# Patient Record
Sex: Female | Born: 1945
Health system: Southern US, Community
[De-identification: ages and names within clinical notes are randomized; demographics above are authoritative.]

## PROBLEM LIST (undated history)

## (undated) DIAGNOSIS — F419 Anxiety disorder, unspecified: Secondary | ICD-10-CM

## (undated) DIAGNOSIS — K7689 Other specified diseases of liver: Secondary | ICD-10-CM

## (undated) DIAGNOSIS — Z973 Presence of spectacles and contact lenses: Secondary | ICD-10-CM

## (undated) DIAGNOSIS — K642 Third degree hemorrhoids: Secondary | ICD-10-CM

## (undated) DIAGNOSIS — F32A Depression, unspecified: Secondary | ICD-10-CM

## (undated) DIAGNOSIS — Z8619 Personal history of other infectious and parasitic diseases: Secondary | ICD-10-CM

## (undated) DIAGNOSIS — K5909 Other constipation: Secondary | ICD-10-CM

## (undated) DIAGNOSIS — F329 Major depressive disorder, single episode, unspecified: Secondary | ICD-10-CM

## (undated) DIAGNOSIS — Z860100 Personal history of colon polyps, unspecified: Secondary | ICD-10-CM

## (undated) DIAGNOSIS — N281 Cyst of kidney, acquired: Secondary | ICD-10-CM

## (undated) DIAGNOSIS — J439 Emphysema, unspecified: Secondary | ICD-10-CM

## (undated) DIAGNOSIS — E785 Hyperlipidemia, unspecified: Secondary | ICD-10-CM

## (undated) DIAGNOSIS — R112 Nausea with vomiting, unspecified: Secondary | ICD-10-CM

## (undated) DIAGNOSIS — M1990 Unspecified osteoarthritis, unspecified site: Secondary | ICD-10-CM

## (undated) DIAGNOSIS — J309 Allergic rhinitis, unspecified: Secondary | ICD-10-CM

## (undated) DIAGNOSIS — K219 Gastro-esophageal reflux disease without esophagitis: Secondary | ICD-10-CM

## (undated) DIAGNOSIS — Z9889 Other specified postprocedural states: Secondary | ICD-10-CM

## (undated) DIAGNOSIS — K644 Residual hemorrhoidal skin tags: Secondary | ICD-10-CM

## (undated) DIAGNOSIS — K5730 Diverticulosis of large intestine without perforation or abscess without bleeding: Secondary | ICD-10-CM

## (undated) DIAGNOSIS — M5481 Occipital neuralgia: Secondary | ICD-10-CM

## (undated) DIAGNOSIS — Z972 Presence of dental prosthetic device (complete) (partial): Secondary | ICD-10-CM

## (undated) DIAGNOSIS — Z8673 Personal history of transient ischemic attack (TIA), and cerebral infarction without residual deficits: Secondary | ICD-10-CM

## (undated) DIAGNOSIS — K862 Cyst of pancreas: Secondary | ICD-10-CM

## (undated) HISTORY — PX: COLONOSCOPY WITH PROPOFOL: SHX5780

## (undated) HISTORY — DX: Major depressive disorder, single episode, unspecified: F32.9

## (undated) HISTORY — PX: OTHER SURGICAL HISTORY: SHX169

## (undated) HISTORY — DX: Residual hemorrhoidal skin tags: K64.4

## (undated) HISTORY — DX: Anxiety disorder, unspecified: F41.9

## (undated) HISTORY — DX: Unspecified osteoarthritis, unspecified site: M19.90

## (undated) HISTORY — PX: ESOPHAGOGASTRODUODENOSCOPY: SHX1529

## (undated) HISTORY — PX: BREAST EXCISIONAL BIOPSY: SUR124

## (undated) HISTORY — DX: Occipital neuralgia: M54.81

## (undated) HISTORY — DX: Hyperlipidemia, unspecified: E78.5

## (undated) HISTORY — DX: Gastro-esophageal reflux disease without esophagitis: K21.9

## (undated) HISTORY — PX: CATARACT EXTRACTION: SUR2

## (undated) HISTORY — DX: Depression, unspecified: F32.A

---
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
## 1993-02-03 HISTORY — PX: OTHER SURGICAL HISTORY: SHX169

## 1998-07-20 ENCOUNTER — Other Ambulatory Visit: Admission: RE | Admit: 1998-07-20 | Discharge: 1998-07-20 | Payer: Self-pay | Admitting: Obstetrics and Gynecology

## 1999-03-01 ENCOUNTER — Encounter: Admission: RE | Admit: 1999-03-01 | Discharge: 1999-03-01 | Payer: Self-pay | Admitting: Surgery

## 1999-03-05 ENCOUNTER — Encounter: Payer: Self-pay | Admitting: Surgery

## 1999-03-05 ENCOUNTER — Encounter: Admission: RE | Admit: 1999-03-05 | Discharge: 1999-03-05 | Payer: Self-pay | Admitting: Surgery

## 1999-07-11 ENCOUNTER — Encounter: Payer: Self-pay | Admitting: Family Medicine

## 1999-07-11 ENCOUNTER — Encounter: Payer: Self-pay | Admitting: Surgery

## 1999-07-11 ENCOUNTER — Encounter: Admission: RE | Admit: 1999-07-11 | Discharge: 1999-07-11 | Payer: Self-pay | Admitting: Surgery

## 1999-08-19 ENCOUNTER — Encounter: Payer: Self-pay | Admitting: Gastroenterology

## 1999-08-19 ENCOUNTER — Ambulatory Visit (HOSPITAL_COMMUNITY): Admission: RE | Admit: 1999-08-19 | Discharge: 1999-08-19 | Payer: Self-pay | Admitting: Family Medicine

## 1999-09-30 ENCOUNTER — Encounter: Payer: Self-pay | Admitting: Obstetrics and Gynecology

## 1999-09-30 ENCOUNTER — Ambulatory Visit (HOSPITAL_COMMUNITY): Admission: RE | Admit: 1999-09-30 | Discharge: 1999-09-30 | Payer: Self-pay | Admitting: Obstetrics and Gynecology

## 2000-06-12 ENCOUNTER — Other Ambulatory Visit: Admission: RE | Admit: 2000-06-12 | Discharge: 2000-06-12 | Payer: Self-pay | Admitting: Family Medicine

## 2000-06-12 ENCOUNTER — Encounter: Payer: Self-pay | Admitting: Surgery

## 2000-06-12 ENCOUNTER — Encounter: Admission: RE | Admit: 2000-06-12 | Discharge: 2000-06-12 | Payer: Self-pay | Admitting: Surgery

## 2001-03-15 ENCOUNTER — Encounter: Admission: RE | Admit: 2001-03-15 | Discharge: 2001-03-15 | Payer: Self-pay | Admitting: Surgery

## 2001-03-15 ENCOUNTER — Encounter: Payer: Self-pay | Admitting: Surgery

## 2001-07-02 ENCOUNTER — Encounter: Admission: RE | Admit: 2001-07-02 | Discharge: 2001-07-02 | Payer: Self-pay | Admitting: Family Medicine

## 2001-07-02 ENCOUNTER — Encounter: Payer: Self-pay | Admitting: Family Medicine

## 2001-07-02 ENCOUNTER — Other Ambulatory Visit: Admission: RE | Admit: 2001-07-02 | Discharge: 2001-07-02 | Payer: Self-pay | Admitting: Family Medicine

## 2001-08-05 ENCOUNTER — Ambulatory Visit (HOSPITAL_COMMUNITY): Admission: RE | Admit: 2001-08-05 | Discharge: 2001-08-05 | Payer: Self-pay | Admitting: Gastroenterology

## 2002-07-22 ENCOUNTER — Encounter: Payer: Self-pay | Admitting: Surgery

## 2002-07-22 ENCOUNTER — Encounter: Admission: RE | Admit: 2002-07-22 | Discharge: 2002-07-22 | Payer: Self-pay | Admitting: Surgery

## 2002-07-22 ENCOUNTER — Other Ambulatory Visit: Admission: RE | Admit: 2002-07-22 | Discharge: 2002-07-22 | Payer: Self-pay | Admitting: Obstetrics and Gynecology

## 2003-08-01 ENCOUNTER — Encounter: Admission: RE | Admit: 2003-08-01 | Discharge: 2003-08-01 | Payer: Self-pay | Admitting: Surgery

## 2003-08-01 ENCOUNTER — Other Ambulatory Visit: Admission: RE | Admit: 2003-08-01 | Discharge: 2003-08-01 | Payer: Self-pay | Admitting: Obstetrics and Gynecology

## 2004-08-02 ENCOUNTER — Other Ambulatory Visit: Admission: RE | Admit: 2004-08-02 | Discharge: 2004-08-02 | Payer: Self-pay | Admitting: Obstetrics and Gynecology

## 2004-11-01 ENCOUNTER — Encounter: Admission: RE | Admit: 2004-11-01 | Discharge: 2004-11-01 | Payer: Self-pay | Admitting: Surgery

## 2005-05-20 ENCOUNTER — Ambulatory Visit: Payer: Self-pay | Admitting: Gastroenterology

## 2005-08-04 ENCOUNTER — Other Ambulatory Visit: Admission: RE | Admit: 2005-08-04 | Discharge: 2005-08-04 | Payer: Self-pay | Admitting: Obstetrics and Gynecology

## 2005-11-07 ENCOUNTER — Encounter: Admission: RE | Admit: 2005-11-07 | Discharge: 2005-11-07 | Payer: Self-pay | Admitting: Sports Medicine

## 2005-11-20 ENCOUNTER — Encounter: Admission: RE | Admit: 2005-11-20 | Discharge: 2005-11-20 | Payer: Self-pay | Admitting: Surgery

## 2006-01-02 ENCOUNTER — Encounter: Admission: RE | Admit: 2006-01-02 | Discharge: 2006-01-02 | Payer: Self-pay | Admitting: Sports Medicine

## 2006-04-03 ENCOUNTER — Encounter: Admission: RE | Admit: 2006-04-03 | Discharge: 2006-04-03 | Payer: Self-pay | Admitting: Sports Medicine

## 2006-11-09 ENCOUNTER — Ambulatory Visit: Payer: Self-pay | Admitting: Family Medicine

## 2006-11-23 ENCOUNTER — Ambulatory Visit: Payer: Self-pay | Admitting: Family Medicine

## 2006-11-23 LAB — CONVERTED CEMR LAB
ALT: 22 units/L (ref 0–35)
AST: 24 units/L (ref 0–37)
Albumin: 4.4 g/dL (ref 3.5–5.2)
Alkaline Phosphatase: 56 units/L (ref 39–117)
BUN: 12 mg/dL (ref 6–23)
Basophils Absolute: 0 10*3/uL (ref 0.0–0.1)
Basophils Relative: 1 % (ref 0–1)
CO2: 26 meq/L (ref 19–32)
Calcium: 9.7 mg/dL (ref 8.4–10.5)
Chloride: 104 meq/L (ref 96–112)
Cholesterol: 222 mg/dL — ABNORMAL HIGH (ref 0–200)
Creatinine, Ser: 0.87 mg/dL (ref 0.40–1.20)
Eosinophils Absolute: 0.2 10*3/uL (ref 0.0–0.7)
Eosinophils Relative: 6 % — ABNORMAL HIGH (ref 0–5)
Free T4: 1.13 ng/dL (ref 0.89–1.80)
Glucose, Bld: 85 mg/dL (ref 70–99)
HCT: 41.9 % (ref 36.0–46.0)
HDL: 72 mg/dL (ref >39)
Hemoglobin: 13.9 g/dL (ref 12.0–15.0)
LDL Cholesterol: 139 mg/dL — ABNORMAL HIGH (ref 0–99)
Lymphocytes Relative: 51 % — ABNORMAL HIGH (ref 12–46)
Lymphs Abs: 1.8 10*3/uL (ref 0.7–3.3)
MCHC: 33.2 g/dL (ref 30.0–36.0)
MCV: 93.9 fL (ref 78.0–100.0)
Monocytes Absolute: 0.2 10*3/uL (ref 0.2–0.7)
Monocytes Relative: 6 % (ref 3–11)
Neutro Abs: 1.2 10*3/uL — ABNORMAL LOW (ref 1.7–7.7)
Neutrophils Relative %: 36 % — ABNORMAL LOW (ref 43–77)
Platelets: 236 10*3/uL (ref 150–400)
Potassium: 4.5 meq/L (ref 3.5–5.3)
RBC: 4.46 M/uL (ref 3.87–5.11)
RDW: 13.6 % (ref 11.5–14.0)
Sodium: 142 meq/L (ref 135–145)
TSH: 1.516 microintl units/mL (ref 0.350–5.50)
Total Bilirubin: 0.5 mg/dL (ref 0.3–1.2)
Total CHOL/HDL Ratio: 3.1
Total Protein: 6.7 g/dL (ref 6.0–8.3)
Triglycerides: 54 mg/dL (ref <150)
VLDL: 11 mg/dL (ref 0–40)
WBC: 3.5 10*3/uL — ABNORMAL LOW (ref 4.0–10.5)

## 2006-12-14 ENCOUNTER — Encounter: Admission: RE | Admit: 2006-12-14 | Discharge: 2006-12-14 | Payer: Self-pay | Admitting: Surgery

## 2008-07-06 ENCOUNTER — Encounter: Payer: Self-pay | Admitting: Gastroenterology

## 2009-02-03 HISTORY — PX: OTHER SURGICAL HISTORY: SHX169

## 2009-02-03 LAB — CONVERTED CEMR LAB: Pap Smear: NORMAL

## 2009-11-02 ENCOUNTER — Encounter: Payer: Self-pay | Admitting: Gastroenterology

## 2009-11-29 ENCOUNTER — Encounter (INDEPENDENT_AMBULATORY_CARE_PROVIDER_SITE_OTHER): Payer: Self-pay | Admitting: *Deleted

## 2009-12-06 ENCOUNTER — Ambulatory Visit: Payer: Self-pay | Admitting: Family Medicine

## 2009-12-06 DIAGNOSIS — E785 Hyperlipidemia, unspecified: Secondary | ICD-10-CM | POA: Insufficient documentation

## 2009-12-06 DIAGNOSIS — F32A Depression, unspecified: Secondary | ICD-10-CM | POA: Insufficient documentation

## 2009-12-06 DIAGNOSIS — J309 Allergic rhinitis, unspecified: Secondary | ICD-10-CM | POA: Insufficient documentation

## 2009-12-06 DIAGNOSIS — R300 Dysuria: Secondary | ICD-10-CM | POA: Insufficient documentation

## 2009-12-06 DIAGNOSIS — F329 Major depressive disorder, single episode, unspecified: Secondary | ICD-10-CM

## 2009-12-06 DIAGNOSIS — K2270 Barrett's esophagus without dysplasia: Secondary | ICD-10-CM | POA: Insufficient documentation

## 2009-12-06 DIAGNOSIS — R1013 Epigastric pain: Secondary | ICD-10-CM | POA: Insufficient documentation

## 2009-12-06 DIAGNOSIS — D649 Anemia, unspecified: Secondary | ICD-10-CM | POA: Insufficient documentation

## 2009-12-06 LAB — CONVERTED CEMR LAB
Bilirubin Urine: NEGATIVE
Glucose, Urine, Semiquant: NEGATIVE
Ketones, urine, test strip: NEGATIVE
Nitrite: NEGATIVE
Protein, U semiquant: NEGATIVE
Specific Gravity, Urine: 1.015
Urobilinogen, UA: 0.2
WBC Urine, dipstick: NEGATIVE
pH: 6

## 2009-12-07 ENCOUNTER — Telehealth (INDEPENDENT_AMBULATORY_CARE_PROVIDER_SITE_OTHER): Payer: Self-pay | Admitting: *Deleted

## 2009-12-07 ENCOUNTER — Encounter: Payer: Self-pay | Admitting: Family Medicine

## 2009-12-10 ENCOUNTER — Ambulatory Visit (HOSPITAL_COMMUNITY): Admission: RE | Admit: 2009-12-10 | Discharge: 2009-12-10 | Payer: Self-pay | Admitting: Family Medicine

## 2009-12-11 ENCOUNTER — Telehealth (INDEPENDENT_AMBULATORY_CARE_PROVIDER_SITE_OTHER): Payer: Self-pay | Admitting: *Deleted

## 2009-12-11 LAB — CONVERTED CEMR LAB
ALT: 22 units/L (ref 0–35)
AST: 30 units/L (ref 0–37)
Albumin: 4.2 g/dL (ref 3.5–5.2)
Alkaline Phosphatase: 59 units/L (ref 39–117)
Amylase: 94 units/L (ref 27–131)
BUN: 13 mg/dL (ref 6–23)
Basophils Absolute: 0 10*3/uL (ref 0.0–0.1)
Basophils Relative: 0.6 % (ref 0.0–3.0)
Bilirubin, Direct: 0 mg/dL (ref 0.0–0.3)
CO2: 33 meq/L — ABNORMAL HIGH (ref 19–32)
Calcium: 9.8 mg/dL (ref 8.4–10.5)
Chloride: 105 meq/L (ref 96–112)
Creatinine, Ser: 0.9 mg/dL (ref 0.4–1.2)
Eosinophils Absolute: 0.1 10*3/uL (ref 0.0–0.7)
Eosinophils Relative: 3 % (ref 0.0–5.0)
GFR calc non Af Amer: 65.66 mL/min (ref >60)
Glucose, Bld: 72 mg/dL (ref 70–99)
H Pylori IgG: POSITIVE
HCT: 39.3 % (ref 36.0–46.0)
Hemoglobin: 13.6 g/dL (ref 12.0–15.0)
Lipase: 65 units/L — ABNORMAL HIGH (ref 11.0–59.0)
Lymphocytes Relative: 66.4 % — ABNORMAL HIGH (ref 12.0–46.0)
Lymphs Abs: 2 10*3/uL (ref 0.7–4.0)
MCHC: 34.7 g/dL (ref 30.0–36.0)
MCV: 94.3 fL (ref 78.0–100.0)
Monocytes Absolute: 0.3 10*3/uL (ref 0.1–1.0)
Monocytes Relative: 8.3 % (ref 3.0–12.0)
Neutro Abs: 0.7 10*3/uL — ABNORMAL LOW (ref 1.4–7.7)
Neutrophils Relative %: 21.7 % — ABNORMAL LOW (ref 43.0–77.0)
Platelets: 211 10*3/uL (ref 150.0–400.0)
Potassium: 5.2 meq/L — ABNORMAL HIGH (ref 3.5–5.1)
RBC: 4.17 M/uL (ref 3.87–5.11)
RDW: 13.6 % (ref 11.5–14.6)
Sodium: 143 meq/L (ref 135–145)
Total Bilirubin: 0.3 mg/dL (ref 0.3–1.2)
Total Protein: 6.4 g/dL (ref 6.0–8.3)
WBC: 3.1 10*3/uL — ABNORMAL LOW (ref 4.5–10.5)

## 2009-12-12 ENCOUNTER — Telehealth: Payer: Self-pay | Admitting: Family Medicine

## 2009-12-13 ENCOUNTER — Telehealth (INDEPENDENT_AMBULATORY_CARE_PROVIDER_SITE_OTHER): Payer: Self-pay | Admitting: *Deleted

## 2009-12-14 ENCOUNTER — Emergency Department (HOSPITAL_BASED_OUTPATIENT_CLINIC_OR_DEPARTMENT_OTHER)
Admission: EM | Admit: 2009-12-14 | Discharge: 2009-12-14 | Payer: Self-pay | Source: Home / Self Care | Admitting: Emergency Medicine

## 2009-12-14 ENCOUNTER — Ambulatory Visit: Payer: Self-pay | Admitting: Diagnostic Radiology

## 2009-12-17 ENCOUNTER — Telehealth (INDEPENDENT_AMBULATORY_CARE_PROVIDER_SITE_OTHER): Payer: Self-pay | Admitting: *Deleted

## 2009-12-18 ENCOUNTER — Telehealth (INDEPENDENT_AMBULATORY_CARE_PROVIDER_SITE_OTHER): Payer: Self-pay | Admitting: *Deleted

## 2009-12-20 ENCOUNTER — Telehealth: Payer: Self-pay | Admitting: Family Medicine

## 2009-12-24 ENCOUNTER — Telehealth (INDEPENDENT_AMBULATORY_CARE_PROVIDER_SITE_OTHER): Payer: Self-pay | Admitting: *Deleted

## 2009-12-25 ENCOUNTER — Telehealth: Payer: Self-pay | Admitting: Family Medicine

## 2009-12-31 ENCOUNTER — Telehealth (INDEPENDENT_AMBULATORY_CARE_PROVIDER_SITE_OTHER): Payer: Self-pay | Admitting: *Deleted

## 2010-01-01 ENCOUNTER — Ambulatory Visit: Payer: Self-pay | Admitting: Family Medicine

## 2010-01-01 DIAGNOSIS — R030 Elevated blood-pressure reading, without diagnosis of hypertension: Secondary | ICD-10-CM | POA: Insufficient documentation

## 2010-01-02 ENCOUNTER — Telehealth (INDEPENDENT_AMBULATORY_CARE_PROVIDER_SITE_OTHER): Payer: Self-pay | Admitting: *Deleted

## 2010-01-07 ENCOUNTER — Encounter: Payer: Self-pay | Admitting: Gastroenterology

## 2010-01-09 ENCOUNTER — Encounter: Payer: Self-pay | Admitting: Family Medicine

## 2010-01-09 ENCOUNTER — Ambulatory Visit: Payer: Self-pay | Admitting: Family Medicine

## 2010-01-09 LAB — CONVERTED CEMR LAB
Cholesterol: 194 mg/dL (ref 0–200)
HDL: 68.2 mg/dL (ref >39.00)
LDL Cholesterol: 120 mg/dL — ABNORMAL HIGH (ref 0–99)
TSH: 1.89 microintl units/mL (ref 0.35–5.50)
Total CHOL/HDL Ratio: 3
Triglycerides: 29 mg/dL (ref 0.0–149.0)
VLDL: 5.8 mg/dL (ref 0.0–40.0)

## 2010-01-10 ENCOUNTER — Ambulatory Visit: Payer: Self-pay | Admitting: Gastroenterology

## 2010-01-10 DIAGNOSIS — Z8719 Personal history of other diseases of the digestive system: Secondary | ICD-10-CM | POA: Insufficient documentation

## 2010-01-11 ENCOUNTER — Encounter: Payer: Self-pay | Admitting: Gastroenterology

## 2010-01-12 ENCOUNTER — Encounter: Payer: Self-pay | Admitting: Gastroenterology

## 2010-01-21 ENCOUNTER — Telehealth: Payer: Self-pay | Admitting: Gastroenterology

## 2010-02-11 ENCOUNTER — Ambulatory Visit: Admit: 2010-02-11 | Payer: Self-pay | Admitting: Family Medicine

## 2010-02-20 ENCOUNTER — Ambulatory Visit
Admission: RE | Admit: 2010-02-20 | Discharge: 2010-02-20 | Payer: Self-pay | Source: Home / Self Care | Attending: Family Medicine | Admitting: Family Medicine

## 2010-02-20 ENCOUNTER — Ambulatory Visit (HOSPITAL_BASED_OUTPATIENT_CLINIC_OR_DEPARTMENT_OTHER)
Admission: RE | Admit: 2010-02-20 | Discharge: 2010-02-20 | Payer: Self-pay | Source: Home / Self Care | Attending: Family Medicine | Admitting: Family Medicine

## 2010-02-20 DIAGNOSIS — R0602 Shortness of breath: Secondary | ICD-10-CM | POA: Insufficient documentation

## 2010-02-23 ENCOUNTER — Encounter: Payer: Self-pay | Admitting: Family Medicine

## 2010-02-24 ENCOUNTER — Encounter: Payer: Self-pay | Admitting: Surgery

## 2010-02-24 ENCOUNTER — Encounter: Payer: Self-pay | Admitting: Sports Medicine

## 2010-02-25 ENCOUNTER — Telehealth (INDEPENDENT_AMBULATORY_CARE_PROVIDER_SITE_OTHER): Payer: Self-pay | Admitting: *Deleted

## 2010-02-27 ENCOUNTER — Ambulatory Visit
Admission: RE | Admit: 2010-02-27 | Discharge: 2010-02-27 | Payer: Self-pay | Source: Home / Self Care | Attending: Family Medicine | Admitting: Family Medicine

## 2010-02-27 DIAGNOSIS — J019 Acute sinusitis, unspecified: Secondary | ICD-10-CM | POA: Insufficient documentation

## 2010-03-05 NOTE — Assessment & Plan Note (Signed)
Summary: ABD PAIN...AS.   History of Present Illness Visit Type: Initial Visit Primary GI MD: Melvia Heaps MD [facility] Primary Provider: Neena Rhymes, MD Chief Complaint: Inermittant upper abd pain and fatigue x 2 -3 months but has stopped in the last 2 weeks. Pt states she has has H. Pylori 2-3 years ago and just recently.  History of Present Illness:   Regina Gonzalez is a pleasant 65 year old white female referred at the request of Dr. Beverely Low for evaluation of abdominal pain.  For the last couple of months she has had several episodes of sharp right upper quadrant pain.  Pain would radiate around but not through to the back.  She has a history of H. pylori that was partially treated  She is on no gastric irritants.  She discontinued her medicines after 5 days because of intolerance to Flagyl.  She has a history of Barrett's esophagus though at last endoscopy 1-1/2 years ago, by report, Barrett's was not seen.  She remains on DEXILANT.  Abdominal ultrasound demonstrated hepatic renal and pancreatic cysts.  She denies pyrosis but complains of hoarseness.  She is on no gastric irritants including nonsteroidals.   GI Review of Systems    Reports acid reflux.      Denies abdominal pain, belching, bloating, chest pain, dysphagia with liquids, dysphagia with solids, heartburn, loss of appetite, nausea, vomiting, vomiting blood, weight loss, and  weight gain.        Denies anal fissure, black tarry stools, change in bowel habit, diarrhea, diverticulosis, fecal incontinence, heme positive stool, hemorrhoids, irritable bowel syndrome, jaundice, light color stool, liver problems, rectal bleeding, and  rectal pain.    Current Medications (verified): 1)  Multivitamins  Tabs (Multiple Vitamin) .... Take One Tablet Daily 2)  Simvastatin 20 Mg Tabs (Simvastatin) .... Take One Tablet Daily 3)  Dexilant 60 Mg Cpdr (Dexlansoprazole) .... Take One Tablet Daily 4)  Fish Oil 1200 Mg Caps (Omega-3 Fatty Acids) ....  Take One Tablet Daily 5)  Alprazolam 0.25 Mg Tabs (Alprazolam) .... Take One Tablet As Needed 6)  Venlafaxine Hcl 150 Mg Xr24h-Tab (Venlafaxine Hcl) .Marland Kitchen.. 1 Tab Daily  Allergies (verified): 1)  ! Flagyl 2)  ! * Mycin  Past History:  Past Medical History: Reviewed history from 01/07/2010 and no changes required. Allergic rhinitis Anemia-NOS Depression Chronic Bronchitis Hyperlipidemia Barrett's Esophagus Hemorrhoids  Past Surgical History: Reviewed history from 12/06/2009 and no changes required. Cyst on Breast removed-Bilateral Endometrial cysts removed Appendectomy Tonsillectomy  Family History: Reviewed history from 12/06/2009 and no changes required. Family History of Alcoholism/Addiction-father Family History of Arthritis-mother CAD-brother,grandparents HTN-grandparents DM-grandparents STROKE-grandparents COLON CA-no BREAST CA-no  Social History: Reviewed history from 01/09/2010 and no changes required. lives w/ husband, mother, youngest daughter and grandson works partime as Scientist, physiological at [employer]. 'love/hate relationship' w/ husband, he's a functional alcoholic  Review of Systems       The patient complains of anxiety-new, arthritis/joint pain, confusion, depression-new, fatigue, and itching.  The patient denies allergy/sinus, anemia, back pain, blood in urine, breast changes/lumps, change in vision, cough, coughing up blood, fainting, fever, headaches-new, hearing problems, heart murmur, heart rhythm changes, menstrual pain, muscle pains/cramps, night sweats, nosebleeds, pregnancy symptoms, shortness of breath, skin rash, sleeping problems, sore throat, swelling of feet/legs, swollen lymph glands, thirst - excessive , urination - excessive , urination changes/pain, urine leakage, vision changes, and voice change.         All other systems were reviewed and were negative   Vital Signs:  Patient  profile:   65 year old female Height:      62.75  inches Weight:      115.25 pounds BMI:     20.65 Pulse rate:   80 / minute Pulse rhythm:   regular BP sitting:   128 / 68  (left arm) Cuff size:   regular  Vitals Entered By: Regina Gonzalez) (January 10, 2010 8:33 AM)  Physical Exam  Additional Exam:  On physical exam she is a well-developed well-nourished female  skin: anicteric HEENT: normocephalic; PEERLA; no nasal or pharyngeal abnormalities neck: supple nodes: no cervical lymphadenopathy chest: clear to ausculatation and percussion heart: no murmurs, gallops, or rubs abd: soft, nontender; BS normoactive; no abdominal masses, tenderness, organomegaly rectal: deferred ext: no cynanosis, clubbing, edema skeletal: no deformities neuro: oriented x 3; no focal abnormalities    Impression & Recommendations:  Problem # 1:  ABDOMINAL PAIN, EPIGASTRIC (ICD-789.06) Etiology is uncertain.  There is no evidence for biliary tract disease by ultrasound.  Active peptic ulcer disease is unlikely.  I suspect pain is due to nonspecific spasm.  Recommendations #1 trial hyomax 0.25 mg sublingual p.r.n.  I carefully instructed the patient to contact me if she has recurrent symptoms that do not respond to this medicine  Problem # 2:  HELICOBACTER PYLORI GASTRITIS, HX OF (ICD-V12.79) She has had only 5 days of therapy.  Recommendations #1 check stool for H. pylori antigen and treat accordingly Orders: T-Helicobactor Pylori Antigen Stool (16109)  Problem # 3:  BARRETTS ESOPHAGUS (ICD-530.85) Plan followup endoscopy in 2013  Patient Instructions: 1)  Copy sent to : Neena Rhymes, MD 2)  You will go to the basement today for labs 3)  The medication list was reviewed and reconciled.  All changed / newly prescribed medications were explained.  A complete medication list was provided to the patient / caregiver. Prescriptions: HYOMAX-SL 0.125 MG SUBL (HYOSCYAMINE SULFATE) Take 2 tabs sublingual q.4 H.  #20 x 1   Entered  and Authorized by:   Louis Meckel MD   Signed by:   Louis Meckel MD on 01/10/2010   Method used:   Electronically to        [facility] [phone]* (retail)       [address]       [place], [place]  [address]       Ph: [phone]       Fax: [phone]   RxID:   [id number]

## 2010-03-05 NOTE — Letter (Signed)
Summary: New Patient letter  [facility]  [address] [place], [place] [address]   Phone: [phone]  Fax: [phone]       11/29/2009 MRN: [id number]  [facility] [address] [place], [place]  [address]  Dear Ms. Regina Gonzalez,  Welcome to the Gastroenterology Division at [facility].    You are scheduled to see Dr. Arlyce Dice on 01/10/2010 at 8:30AM on the 3rd floor at [facility], [address]  We ask that you try to arrive at our office 15 minutes prior to your appointment time to allow for check-in.  We would like you to complete the enclosed self-administered evaluation form prior to your visit and bring it with you on the day of your appointment.  We will review it with you.  Also, please bring a complete list of all your medications or, if you prefer, bring the medication bottles and we will list them.  Please bring your insurance card so that we may make a copy of it.  If your insurance requires a referral to see a specialist, please bring your referral form from your primary care physician.  Co-payments are due at the time of your visit and may be paid by cash, check or credit card.     Your office visit will consist of a consult with your physician (includes a physical exam), any laboratory testing he/she may order, scheduling of any necessary diagnostic testing (e.g. x-ray, ultrasound, CT-scan), and scheduling of a procedure (e.g. Endoscopy, Colonoscopy) if required.  Please allow enough time on your schedule to allow for any/all of these possibilities.    If you cannot keep your appointment, please call [phone] to cancel or reschedule prior to your appointment date.  This allows [place] the opportunity to schedule an appointment for another patient in need of care.  If you do not cancel or reschedule by 5 p.m. the business day prior to your appointment date, you will be charged a $50.00 late cancellation/no-show fee.    Thank you for choosing  [facility] for your medical needs.  We appreciate the opportunity to care for you.  Please visit [place] at our website  to learn more about our practice.                     Sincerely,                                                             The Gastroenterology Division

## 2010-03-05 NOTE — Progress Notes (Signed)
Summary: Still Sick   Phone Note Call from Patient   Caller: Patient Reason for Call: Acute Illness Summary of Call: Patient called with concerns of still feeling "under the weather" and is having a slight pain under her right ribs radiating through to her back. She said she has just started the new medicines prescribed and knows they are not fully in her system. She is to see Dr. Beverely Low in 4 weeks and see Dr. Arlyce Dice @ [facility] that week as well. She questioned if anything showed up on her abd u/s as well. I informed patient that the u/s was normal for what Dr. Beverely Low was checking for. Speaking with Dr. Beverely Low she feels it is best for patient to try and see GI if they can work her in. Patient will call us back and let us know what they say. Patient informed that if pain becomes more severe to seek care at ER. Patient aware and agreeable. She will call GI office now.  Initial call taken by: Harold Barban,  December 13, 2009 11:41 AM  Follow-up for Phone Call        noted.  pt's lipase was elevated at last visit.  may need this repeated.  if GI is unable to see her she needs appt and if having pancreatitis possibly admitted for pain control and fluids Follow-up by: Neena Rhymes MD,  December 13, 2009 11:50 AM  Additional Follow-up for Phone Call Additional follow up Details #1::        Left message for patient to call back and she how she was doing. Additional Follow-up by: Harold Barban,  December 14, 2009 11:24 AM    Additional Follow-up for Phone Call Additional follow up Details #2::    Pt left message on triage stating that she still feels bad. Taking medication as directed. Assumes she hasnt had time to get well yet. "I still feel drug out and blah all the time." Needs Pristique and Simvastatin. Needs to talk with Nurse before it is ordered since she has financiall aid assistance. Contact [phone] Follow-up by: Lavell Islam,  December 14, 2009 2:10 PM  Additional Follow-up for  Phone Call Additional follow up Details #3:: Details for Additional Follow-up Action Taken: I spoke with patient and she indicated thats she has pending appointment for 01/10/2010 for GI, patient indicated that she has felt bad for a long time. I recommended patient be seen at emergency room based on how she spoke about being in pain. I spoke with patient's husband and informed him how to get to the [facility], I felt assured that patient's husband wuold take her at the end of conversation    Shonna Chock CMA  December 14, 2009 5:19 PM

## 2010-03-05 NOTE — Assessment & Plan Note (Signed)
Summary: NEW PT TO ESTAB--HAS GI ISSUES///SPH   Vital Signs:  Patient profile:   65 year old female Height:      62.75 inches Weight:      118 pounds BMI:     21.15 Pulse rate:   84 / minute BP sitting:   118 / 74  (left arm)  Vitals Entered By: Doristine Devoid CMA (December 06, 2009 3:33 PM) CC: NEW EST- dysuria and RLQ pain    History of Present Illness: 65 yo woman here today to establish care.  previous MD- [place] [facility] ([facility] [place]), GYN- [facility], GI- Dierdre Searles (has upcoming appt w/ Dr Arlyce Dice).  Hyperlipidemia- on Simvastatin.  last labs done 1 year ago.  due for labs.  no N/V, myalgias.  Barrett's Esophagus- has appt upcoming w/ Dr Arlyce Dice.  currently on Dexilant.  has previously tried Aciphex, Prilosec.  still having reflux regularly.  Depression/Anxiety- on Pristiq and xanax (nightly for sleep).  not in counseling.  feels sxs are fairly well controlled.  Abd pain- reports constipation (has taken Miralax but doesn't feel like she's emptying completely), stool now more 'golden' in color.  pain has been intermittant for last month.  skin was sensitive to touch 'had to turn away from the shower water' but this has improved.  no blood in stool.  no vomiting.  no pain today but still 'tenderness when i touch my stomach.  increased gas and bloating.  no change in appetite.  no new medicines.  dysuria- no hematuria, some frequency, no urgency or hesitancy.  sxs started 2-3 days ago.   Preventive Screening-Counseling & Management      Sexual History:  currently monogamous.        Drug Use:  never.    Current Medications (verified): 1)  Multivitamins  Tabs (Multiple Vitamin) .... Take One Tablet Daily 2)  Simvastatin 20 Mg Tabs (Simvastatin) .... Take One Tablet Daily 3)  Pristiq 50 Mg Xr24h-Tab (Desvenlafaxine Succinate) .... Take One Tablet Daily 4)  Dexilant 60 Mg Cpdr (Dexlansoprazole) .... Take One Tablet Daily 5)  Fish Oil 1200 Mg Caps (Omega-3 Fatty Acids) ....  Take One Tablet Daily 6)  Alprazolam 0.25 Mg Tabs (Alprazolam) .... Take One Tablet As Needed  Allergies (verified): No Known Drug Allergies  Past History:  Past Medical History: Allergic rhinitis Anemia-NOS Depression Chronic Bronchitis Hyperlipidemia Barrett's Esophagus  Past Surgical History: Cyst on Breast removed-Bilateral Endometrial cysts removed Appendectomy Tonsillectomy  Family History: Family History of Alcoholism/Addiction-father Family History of Arthritis-mother CAD-brother,grandparents HTN-grandparents DM-grandparents STROKE-grandparents COLON CA-no BREAST CA-no  Social History: lives w/ husband, mother, youngest daughter and grandson works partime as Scientist, 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 Lungs:  Normal respiratory effort, chest expands symmetrically. Lungs are clear to auscultation, no crackles or wheezes. Heart:  reg S1/S2, no M/R/G Abdomen:  soft, NT/ND, +BS, no rebound or guarding.  abdominal aorta is pulsating and seems wider than normal Pulses:  +2 carotid, radial, DP Extremities:  no C/C/E Neurologic:  alert & oriented X3, cranial nerves II-XII intact, gait normal, and DTRs symmetrical and normal.   Cervical Nodes:  No lymphadenopathy noted Psych:  Cognition and judgment appear intact. Alert and cooperative with normal attention span and concentration. No  apparent delusions, illusions, hallucinations   Impression & Recommendations:  Problem # 1:  ABDOMINAL PAIN, EPIGASTRIC (ICD-789.06) Assessment New no pain on exam today.  check CBC to r/o infxn, electrolytes, LFTs, amylase/lipase to r/o pancreatitis, H pylori given hx of Barrett's and get [place] of  abd aorta given increased size and pulsatility.  continue Dexilant.  will follow closely. Orders: Venipuncture ([address]) TLB-BMP (Basic Metabolic Panel-BMET) (80048-METABOL) TLB-CBC Platelet - w/Differential (85025-CBCD) TLB-Hepatic/Liver Function Pnl (80076-HEPATIC) TLB-Amylase (82150-AMYL) TLB-Lipase (83690-LIPASE) TLB-H. Pylori Abs(Helicobacter Pylori) (86677-HELICO) Radiology Referral (Radiology)  Problem # 2:  DYSURIA (ICD-788.1) Assessment: New UA shows blood.  start meds and await urine cx. Her updated medication list for this problem includes:    Cephalexin 500 Mg Tabs (Cephalexin) .Marland Kitchen... Take one by mouth 2 times daily x5 days  Orders: UA Dipstick w/o Micro (manual) (60454) Specimen Handling (99000) T-Culture, Urine (09811-91478)  Problem # 3:  BARRETTS ESOPHAGUS (ICD-530.85) Assessment: New check for H pylori.  continue Dexilant.  has appt w/ GI upcoming.  Problem # 4:  HYPERLIPIDEMIA (ICD-272.4) due for labs but not fasting today.  will get labs when pt returns for CPE. Her updated medication list for this problem includes:    Simvastatin 20 Mg Tabs (Simvastatin) .Marland Kitchen... Take one tablet daily  Problem # 5:  DEPRESSION (ICD-311) fairly well controlled.  pt denies SI/HI.  continue meds. Her updated medication list for this problem includes:    Pristiq 50 Mg Xr24h-tab (Desvenlafaxine succinate) .Marland Kitchen... Take one tablet daily    Alprazolam 0.25 Mg Tabs (Alprazolam) .Marland Kitchen... Take one tablet as needed  Complete Medication List: 1)  Multivitamins Tabs (Multiple vitamin) .... Take one tablet daily 2)  Simvastatin 20 Mg Tabs (Simvastatin) .... Take one tablet daily 3)  Pristiq 50 Mg Xr24h-tab (Desvenlafaxine succinate) .... Take one tablet daily 4)  Dexilant 60 Mg Cpdr (Dexlansoprazole) .... Take one tablet daily 5)  Fish Oil 1200 Mg Caps (Omega-3 fatty acids) .... Take one tablet daily 6)  Alprazolam 0.25 Mg Tabs (Alprazolam) .... Take one tablet as needed 7)  Cephalexin 500 Mg  Tabs (Cephalexin) .... Take one by mouth 2 times daily x5 days  Patient Instructions: 1)  Please schedule your complete physical in the next 3-6 weeks at your convenience- do not eat before this appt 2)  Continue the Dexilant 3)  Someone will call you with your ultrasound appt 4)  We'll notify you of your lab results 5)  Start the cephalexin as directed for a potential urinary tract infection- take w/ food to avoid upset stomach 6)  Call with any questions or concerns 7)  If you pain suddenly worsens or you have any other concerns- please call or go to the ER 8)  Welcome!  We're glad to have you! Prescriptions: CEPHALEXIN 500 MG  TABS (CEPHALEXIN) take one by mouth 2 times daily x5 days  #10 x 0   Entered and Authorized by:   Neena Rhymes MD   Signed by:   Neena Rhymes MD on 12/06/2009   Method used:   Print then Give to Patient   RxID:   [id number]    Orders Added: 1)  UA Dipstick w/o Micro (manual) [81002] 2)  Specimen Handling [99000] 3)  T-Culture, Urine [[id number]] 4)  Venipuncture [36415] 5)  TLB-BMP (Basic Metabolic Panel-BMET) [80048-METABOL] 6)  TLB-CBC Platelet - w/Differential [85025-CBCD] 7)  TLB-Hepatic/Liver Function Pnl [80076-HEPATIC] 8)  TLB-Amylase [82150-AMYL] 9)  TLB-Lipase [83690-LIPASE] 10)  TLB-H. Pylori Abs(Helicobacter Pylori) [86677-HELICO] 11)  Radiology Referral [Radiology] 12)  New Patient Level III [16109]   Immunization History:  Influenza Immunization History:    Influenza:  historical (11/03/2009)   Immunization History:  Influenza Immunization History:    Influenza:  Historical (11/03/2009)   Preventive Care Screening  Pap Smear:    Date:  02/03/2009    Results:  normal   Last Tetanus Booster:    Date:  02/04/2008    Results:  Historical   Mammogram:    Date:  02/04/2008    Results:  normal   Colonoscopy:    Date:  02/03/2001    Results:  normal     Laboratory Results   Urine Tests    Routine  Urinalysis   Glucose: negative   (Normal Range: Negative) Bilirubin: negative   (Normal Range: Negative) Ketone: negative   (Normal Range: Negative) Spec. Gravity: 1.015   (Normal Range: 1.003-1.035) Blood: moderate   (Normal Range: Negative) pH: 6.0   (Normal Range: 5.0-8.0) Protein: negative   (Normal Range: Negative) Urobilinogen: 0.2   (Normal Range: 0-1) Nitrite: negative   (Normal Range: Negative) Leukocyte Esterace: negative   (Normal Range: Negative)

## 2010-03-05 NOTE — Progress Notes (Signed)
Summary: Doxycycline refill  Phone Note Refill Request Message from:  Fax from Pharmacy on December 24, 2009 3:19 PM  Refills Requested: Medication #1:  DOXYCYCLINE HYCLATE 100 MG CAPS Take 1 by mouth two times a day.   Last Refilled: 12/12/2009 [facility] pharmacy, [address]    [phone]    fax [phone]   qty = 28  Next Appointment Scheduled: Wed 01/09/2010 Initial call taken by: Jerolyn Shin,  December 24, 2009 3:25 PM  Follow-up for Phone Call        Rx denied because if patient symptoms aren't better needs office visit .......Marland KitchenDoristine Devoid CMA  December 24, 2009 4:38 PM   left message on machine ........Marland KitchenDoristine Devoid CMA  December 24, 2009 4:47 PM   Pt states that she did not request med and is unsure why med was requested. Advise pt will disregard request............Marland KitchenFelecia Deloach CMA  December 25, 2009 10:32 AM

## 2010-03-05 NOTE — Progress Notes (Signed)
Summary: labs  Phone Note Outgoing Call   Call placed by: Doristine Devoid CMA,  December 11, 2009 8:56 AM Call placed to: Patient Summary of Call: this is likely the cause of pt's persistant reflux.  start Bismuth subsalicylate 525mg  three times a day + metronidazole 500mg  three times a day + tetracycline 500mg  three times a day (all for 14 days).  should continue PPI (dexilant)  mildly low WBC and elevated lymphocyte % is consistent w/ a viral illness.  will repeat CBC in 1 month Lipase is also mildly elevated- this is not specific to any one medical problem and should just be repeated in 1 month w/ CBC. Remainder of labs look good  Follow-up for Phone Call        left message on machine .........Marland KitchenDoristine Devoid CMA  December 11, 2009 8:57 AM   spoke w/ patient aware of all results and that medication to be called into pharmacy........Marland KitchenDoristine Devoid CMA  December 11, 2009 2:33 PM  [facility]    New/Updated Medications: METRONIDAZOLE 500 MG TABS (METRONIDAZOLE) take one tablet three times a day TETRACYCLINE HCL 500 MG CAPS (TETRACYCLINE HCL) take one tablet three times a day Prescriptions: TETRACYCLINE HCL 500 MG CAPS (TETRACYCLINE HCL) take one tablet three times a day  #42 x 0   Entered by:   Doristine Devoid CMA   Authorized by:   Neena Rhymes MD   Signed by:   Doristine Devoid CMA on 12/11/2009   Method used:   Electronically to        [facility] [phone]* (retail)       [address]       [place], [place]  [address]       Ph: [phone]       Fax: [phone]   RxID:   [id number] METRONIDAZOLE 500 MG TABS (METRONIDAZOLE) take one tablet three times a day  #42 x 0   Entered by:   Doristine Devoid CMA   Authorized by:   Neena Rhymes MD   Signed by:   Doristine Devoid CMA on 12/11/2009   Method used:   Electronically to        [facility] [phone]* (retail)       [address]       [place], [place]   [address]       Ph: [phone]       Fax: [phone]   RxID:   [id number]

## 2010-03-05 NOTE — Progress Notes (Signed)
Summary: still no better  Phone Note Call from Patient Call back at Home Phone [phone]   Refills Requested: Medication #1:  DOXYCYCLINE HYCLATE 100 MG CAPS Take 1 by mouth two times a day.  Medication #2:  METRONIDAZOLE 500 MG TABS take one tablet three times a day Caller: Patient Summary of Call: Pt states that she feels that med is causing extreme fatigue, dizziness and metallic taste. Pt denies any abdominal pain/tenderness, nausea, vomiting, diarrhea,fever or change in appetite. Pt did go to hosp and they did not find any pancreatitis or any other issue that contribute to pt concerns.. Pls advise.................Marland KitchenFelecia Deloach CMA  December 17, 2009 10:38 AM   Follow-up for Phone Call        should stop both doxy and flagyl and see if symptoms improve. Follow-up by: Neena Rhymes MD,  December 17, 2009 11:06 AM  Additional Follow-up for Phone Call Additional follow up Details #1::        Tried to call pt no answer will try again later..........Marland KitchenFelecia Deloach CMA  December 17, 2009 11:19 AM   Pt aware.Marti Sleigh Deloach CMA  December 17, 2009 11:30 AM

## 2010-03-05 NOTE — Progress Notes (Signed)
Summary: gas,diarrhea  Phone Note Call from Patient Call back at [phone]   Caller: Patient Summary of Call: Pt c/o diarrhea, excessive burping and gas on yesterday. Pt states that symptom have subsided today but she still feels a little funny. Pt just wanted to inform Dr Beverely Low of this............Marland KitchenFelecia Deloach CMA  December 25, 2009 11:51 AM   Follow-up for Phone Call        appreciate the info.  should monitor sxs and schedule appt if things worsen Follow-up by: Neena Rhymes MD,  December 25, 2009 11:54 AM  Additional Follow-up for Phone Call Additional follow up Details #1::        Pt aware..........Marland KitchenFelecia Deloach CMA  December 25, 2009 12:20 PM

## 2010-03-05 NOTE — Progress Notes (Signed)
Summary: med on back order  Phone Note Refill Request Message from:  Pharmacy  Refills Requested: Medication #1:  TETRACYCLINE HCL 500 MG CAPS take one tablet three times a day. Pharmacy states that med is on back order. Pls advise on alternative med. Pt used [facility] [address].....................Marland KitchenFelecia Deloach CMA  December 12, 2009 12:04 PM    Follow-up for Phone Call        can switch to Doxycycline 100mg  by mouth two times a day Follow-up by: Neena Rhymes MD,  December 12, 2009 12:44 PM    New/Updated Medications: DOXYCYCLINE HYCLATE 100 MG CAPS (DOXYCYCLINE HYCLATE) Take 1 by mouth two times a day Prescriptions: DOXYCYCLINE HYCLATE 100 MG CAPS (DOXYCYCLINE HYCLATE) Take 1 by mouth two times a day  #28 x 0   Entered by:   Jeremy Johann CMA   Authorized by:   Neena Rhymes MD   Signed by:   Jeremy Johann CMA on 12/12/2009   Method used:   Faxed to ...       [facility]  [address] [phone]* (retail)       [address]       [place], [place]  [address]       Ph: [phone]       Fax: [phone]   RxID:   [id number]

## 2010-03-05 NOTE — Assessment & Plan Note (Signed)
Summary: fatigue, not feeling well//fd   Vital Signs:  Patient profile:   65 year old female Weight:      115 pounds O2 Sat:      97 % on Room air Temp:     97.8 degrees F oral Pulse rate:   78 / minute BP sitting:   140 / 70  (left arm)  Vitals Entered By: Doristine Devoid CMA (January 01, 2010 9:35 AM)  O2 Flow:  Room air CC: fatigue just not feeling well still w/ some stomach isssues    History of Present Illness: 65 yo woman here today for   1) fatigue- sleeping well at night but still daytime fatigue.  'i feel like i do when i have a virus- rundown'.  will have relief when she takes Advil.  pt reports she is usually active.  admits depression is not well controlled on 50mg  of Pristiq.  2) abd pain- reports stomach is 'not well, but much better'.  has upcoming GI appt on 12/8.  unable to tolerate H pylori tx- only completed 5 days of 7 day course.  went to ER on 11/11 and had normal labs, normal [place], normal CXR.  3) elevated BP- reports BP was been 150-180 systolic over last few weeks.  admits to increased stress/anxiety.  mother is living w/ pt and 'not doing well at all'.  very anxious about her current health.  denies CP, SOB, HAs, visual changes, edema.  Current Medications (verified): 1)  Multivitamins  Tabs (Multiple Vitamin) .... Take One Tablet Daily 2)  Simvastatin 20 Mg Tabs (Simvastatin) .... Take One Tablet Daily 3)  Dexilant 60 Mg Cpdr (Dexlansoprazole) .... Take One Tablet Daily 4)  Fish Oil 1200 Mg Caps (Omega-3 Fatty Acids) .... Take One Tablet Daily 5)  Alprazolam 0.25 Mg Tabs (Alprazolam) .... Take One Tablet As Needed 6)  Venlafaxine Hcl 150 Mg Xr24h-Tab (Venlafaxine Hcl) .Marland Kitchen.. 1 Tab Daily  Allergies (verified): No Known Drug Allergies  Past History:  Past medical, surgical, family and social histories (including risk factors) reviewed, and no changes noted (except as noted below).  Past Medical History: Reviewed history from 12/06/2009 and no changes  required. Allergic rhinitis Anemia-NOS Depression Chronic Bronchitis Hyperlipidemia Barrett's Esophagus  Past Surgical History: Reviewed history from 12/06/2009 and no changes required. Cyst on Breast removed-Bilateral Endometrial cysts removed Appendectomy Tonsillectomy  Family History: Reviewed history from 12/06/2009 and no changes required. Family History of Alcoholism/Addiction-father Family History of Arthritis-mother CAD-brother,grandparents HTN-grandparents DM-grandparents STROKE-grandparents COLON CA-no BREAST CA-no  Social History: Reviewed history from 12/06/2009 and no changes required. lives w/ husband, mother, youngest daughter and grandson works partime as Scientist, physiological at [employer].  Review of Systems      See HPI  Physical Exam  General:  Well-developed,well-nourished,in no acute distress; alert,appropriate and cooperative throughout examination Head:  NCAT, no TTP over sinuses Eyes:  PERRL, EOMI, fundi WNL, no injxn or inflammation Ears:  External ear exam shows no significant lesions or deformities.  Otoscopic examination reveals clear canals, tympanic membranes are intact bilaterally without bulging, retraction, inflammation or discharge. Hearing is grossly normal bilaterally. Nose:  External nasal examination shows no deformity or inflammation. Nasal mucosa are pink and moist without lesions or exudates. Mouth:  Oral mucosa and oropharynx without lesions or exudates.  Teeth in good repair. Neck:  No deformities, masses, or tenderness noted. Lungs:  Normal respiratory effort, chest expands symmetrically. Lungs are clear to auscultation, no crackles or wheezes. Heart:  reg S1/S2, no M/R/G Abdomen:  soft, NT/ND, +BS, no rebound or guarding.  Pulses:  +2 carotid, radial, DP Extremities:  no C/C/E Psych:  very anxious   Impression & Recommendations:  Problem # 1:  DEPRESSION (ICD-311) Assessment Deteriorated likely the cause of pt's malaise.  sxs  not adequately controlled on Pristiq.  pt would like to switch to generic med if possible due to cost.  will switch to effexor and increase dose.  will follow closely. The following medications were removed from the medication list:    Pristiq 50 Mg Xr24h-tab (Desvenlafaxine succinate) .Marland Kitchen... Take one tablet daily Her updated medication list for this problem includes:    Alprazolam 0.25 Mg Tabs (Alprazolam) .Marland Kitchen... Take one tablet as needed    Venlafaxine Hcl 150 Mg Xr24h-tab (Venlafaxine hcl) .Marland Kitchen... 1 tab daily  Problem # 2:  ABDOMINAL PAIN, EPIGASTRIC (ICD-789.06) Assessment: Improved pt's sxs improved since last visit but haven't resolved.  has upcoming GI appt.  unable to tolerate H pylori tx.  Problem # 3:  ELEVATED BP W/O HYPERTENSION (ICD-796.2) Assessment: New fairly normal today but pt reports this has been higher.  likely due to pt's anxiety level.  will follow closely.  Complete Medication List: 1)  Multivitamins Tabs (Multiple vitamin) .... Take one tablet daily 2)  Simvastatin 20 Mg Tabs (Simvastatin) .... Take one tablet daily 3)  Dexilant 60 Mg Cpdr (Dexlansoprazole) .... Take one tablet daily 4)  Fish Oil 1200 Mg Caps (Omega-3 fatty acids) .... Take one tablet daily 5)  Alprazolam 0.25 Mg Tabs (Alprazolam) .... Take one tablet as needed 6)  Venlafaxine Hcl 150 Mg Xr24h-tab (Venlafaxine hcl) .Marland Kitchen.. 1 tab daily  Patient Instructions: 1)  Follow up as scheduled for your physical- do not eat before this appt 2)  Take 2 of the remaining Pristiq and then take 1 of the Venlafaxine when you pick up your prescription 3)  A lot of your symptoms may be related to your anxiety and depression- hopefully switching the medication will improve things 4)  Call with any questions or concerns 5)  Hang in there!!! Prescriptions: VENLAFAXINE HCL 150 MG XR24H-TAB (VENLAFAXINE HCL) 1 tab daily  #30 x 3   Entered and Authorized by:   Neena Rhymes MD   Signed by:   Neena Rhymes MD on  01/01/2010   Method used:   Electronically to        [facility] [phone]* (retail)       [address]       [place], [place]  [address]       Ph: [phone]       Fax: [phone]   RxID:   [id number]    Orders Added: 1)  Est. Patient Level IV [83382]

## 2010-03-05 NOTE — Progress Notes (Signed)
Summary: simvastatin refill   Phone Note Refill Request Message from:  Patient  Refills Requested: Medication #1:  SIMVASTATIN 20 MG TABS take one tablet daily [facility] [address]................Marland KitchenFelecia Deloach CMA  December 31, 2009 10:55 AM      Prescriptions: SIMVASTATIN 20 MG TABS (SIMVASTATIN) take one tablet daily  #30 x 3   Entered by:   Doristine Devoid CMA   Authorized by:   Neena Rhymes MD   Signed by:   Doristine Devoid CMA on 12/31/2009   Method used:   Electronically to        [facility] [phone]* (retail)       [address]       [place], [place]  [address]       Ph: [phone]       Fax: [phone]   RxID:   [id number]

## 2010-03-05 NOTE — Procedures (Signed)
Summary: colonoscopy  08/05/2001 procedure date   Colonoscopy  Procedure date:  08/05/2001  Findings:      Results: Hemorrhoids.     Location:  [facility].   Patient Name: Regina Gonzalez, Regina Gonzalez. MRN: [id number] Procedure Procedures: Colonoscopy CPT: [phone].  Personnel: Endoscopist: Barbette Hair. Arlyce Dice, MD.  Indications Symptoms: Constipation  History  Pre-Exam Physical: Performed Aug 05, 2001. Entire physical exam was normal.  Exam Exam: Extent of exam reached: Cecum, extent intended: Cecum.  The cecum was identified by IC valve. Colon retroflexion performed. ASA Classification: I. Tolerance: excellent.  Monitoring: Pulse and BP monitoring, Oximetry used. Supplemental O2 given. at 2 Liters.  Colon Prep Used Golytely for colon prep. Prep results: fair, adequate exam.  Sedation Meds: Fentanyl 75 mcg. given IV. Versed 7.5 mg. given IV.  Findings - NORMAL EXAM: Cecum to Rectum.  HEMORRHOIDS: External. ICD9: Hemorrhoids, External: 455.3.   Assessment Abnormal examination, see findings above.  Diagnoses: 455.3: Hemorrhoids, External.   Events  Unplanned Interventions: No intervention was required.  Unplanned Events: There were no complications. Plans Medication Plan: Fiber supplements: Bran 1 Tbsp QD, starting Aug 05, 2001   Comments: t/c enrollment in IBS/constipation study Scheduling/Referral: Office Visit, to [employer]. Arlyce Dice, MD, around Sep 02, 2001.    This report was created from the original endoscopy report, which was reviewed and signed by the above listed endoscopist.

## 2010-03-05 NOTE — Progress Notes (Signed)
Summary: refill  Phone Note Refill Request Call back at [phone] CELL Message from:  Patient  Refills Requested: Medication #1:  PRISTIQ 50 MG XR24H-TAB take one tablet daily Pls call to order pt med to be deliver to office. [employer]. PH [phone]    Follow-up for Phone Call        spoke w/ patient informed that recording was asking for an identifier which we don't have that infomation provided patient w/ # [phone] to see if she can order medication since I was unable to ........Marland KitchenDoristine Devoid CMA  December 19, 2009 11:33 AM

## 2010-03-05 NOTE — Progress Notes (Signed)
Summary: alprazolam refill   Phone Note Refill Request Message from:  Fax from Pharmacy on January 02, 2010 2:18 PM  Refills Requested: Medication #1:  ALPRAZOLAM 0.25 MG TABS take one tablet as needed [facility] [address] -fax [phone]  Initial call taken by: Okey Regal [place],  January 02, 2010 2:19 PM    Prescriptions: ALPRAZOLAM 0.25 MG TABS (ALPRAZOLAM) take one tablet as needed  #30 x 0   Entered by:   Doristine Devoid CMA   Authorized by:   Neena Rhymes MD   Signed by:   Doristine Devoid CMA on 01/03/2010   Method used:   Printed then faxed to ...       [facility]  [address] [phone]* (retail)       [address]       [place], [place]  [address]       Ph: [phone]       Fax: [phone]   RxID:   [id number]

## 2010-03-05 NOTE — Progress Notes (Signed)
Summary: CONCERNS ABOUT AORTIC U/S  Phone Note Call from Patient Call back at [phone] CELL   Caller: Patient Call For: Neena Rhymes MD Summary of Call: PATIENT CALLING IN REFERENCE TO HER APPT ON 12-06-2009, STATES SHE FORGOT TO INFORM DR. TABORI THAT ABOUT 3-4 WEEKS AGO, SHE HIIT RIGHT SIDE OF HER ABD INTO THE SHARP CORNER OF PIECE OF WOOD.  PATIENT STATES THIS PAIN WAS SO SEVERE.   PATIENT CONCERNED THIS MAY BE CAUSING HER PROBLEM.  PATIENT IS VERY ANXIOUS FOR LAB RESULTS, ALSO WANTS A CALL WITH MORE INFORMATION ON WHAT RISKS SHE MAY BE FACING WITH HER DIAGNOSIS, SHE HAS MANY QUESTIONS, AND IS KEEPING HER CELL PHONE BY HER ALL DAY, WANTS TO SPEAK WITH SOMEONE TODAY PLEASE.   Initial call taken by: Magdalen Spatz [facility],  December 07, 2009 11:14 AM  Follow-up for Phone Call        left message on machine .........Marland KitchenDoristine Devoid CMA  December 07, 2009 2:36 PM   Additional Follow-up for Phone Call Additional follow up Details #1::        at this time pt does not have a diagnosis- that is why we are doing the ultrasound.  depending on what the test shows [place] we can have a better discussion.  her hitting her abdomen may be responsible for the pain she was having in the skin.  she is also H pylori (+) which could contribute to her abdominal discomfort and reflux.  once we have her ultrasound and review the results we will call her.  if she still has questions we can schedule an appt Additional Follow-up by: Neena Rhymes MD,  December 10, 2009 8:08 AM

## 2010-03-05 NOTE — Assessment & Plan Note (Signed)
Summary: cpx & lab/cbs   Vital Signs:  Patient profile:   65 year old female Height:      62.75 inches Weight:      115 pounds BMI:     20.61 Pulse rate:   82 / minute BP sitting:   124 / 72  (left arm)  Vitals Entered By: Doristine Devoid CMA (January 09, 2010 7:59 AM) CC: CPX AND LABS    History of Present Illness: 65 yo woman here today for CPE.  'i feel so much better'.  pap <6 months ago.  mammo due after first of year.  Preventive Screening-Counseling & Management  Alcohol-Tobacco     Alcohol drinks/day: 0     Smoking Status: quit  Caffeine-Diet-Exercise     Does Patient Exercise: yes      Sexual History:  currently monogamous.        Drug Use:  never.    Current Medications (verified): 1)  Multivitamins  Tabs (Multiple Vitamin) .... Take One Tablet Daily 2)  Simvastatin 20 Mg Tabs (Simvastatin) .... Take One Tablet Daily 3)  Dexilant 60 Mg Cpdr (Dexlansoprazole) .... Take One Tablet Daily 4)  Fish Oil 1200 Mg Caps (Omega-3 Fatty Acids) .... Take One Tablet Daily 5)  Alprazolam 0.25 Mg Tabs (Alprazolam) .... Take One Tablet As Needed 6)  Venlafaxine Hcl 150 Mg Xr24h-Tab (Venlafaxine Hcl) .Marland Kitchen.. 1 Tab Daily  Allergies (verified): No Known Drug Allergies  Past History:  Past medical, surgical, family and social histories (including risk factors) reviewed, and no changes noted (except as noted below).  Past Medical History: Reviewed history from 01/07/2010 and no changes required. Allergic rhinitis Anemia-NOS Depression Chronic Bronchitis Hyperlipidemia Barrett's Esophagus Hemorrhoids  Past Surgical History: Reviewed history from 12/06/2009 and no changes required. Cyst on Breast removed-Bilateral Endometrial cysts removed Appendectomy Tonsillectomy  Family History: Reviewed history from 12/06/2009 and no changes required. Family History of Alcoholism/Addiction-father Family History of  Arthritis-mother CAD-brother,grandparents HTN-grandparents DM-grandparents STROKE-grandparents COLON CA-no BREAST CA-no  Social History: Reviewed history from 12/06/2009 and no changes required. lives w/ husband, mother, youngest daughter and grandson works partime as Scientist, physiological at [employer]. 'love/hate relationship' w/ husband, he's a functional alcoholicSmoking Status:  quit Does Patient Exercise:  yes  Review of Systems       The patient complains of abdominal pain, severe indigestion/heartburn, and depression.  The patient denies anorexia, fever, weight loss, weight gain, vision loss, decreased hearing, hoarseness, chest pain, syncope, dyspnea on exertion, peripheral edema, prolonged cough, headaches, melena, hematochezia, suspicious skin lesions, abnormal bleeding, enlarged lymph nodes, and breast masses.    Physical Exam  General:  Well-developed,well-nourished,in no acute distress; alert,appropriate and cooperative throughout examination Head:  Normocephalic and atraumatic without obvious abnormalities. No apparent alopecia or balding. Eyes:  No corneal or conjunctival inflammation noted. EOMI. Perrla. Funduscopic exam benign, without hemorrhages, exudates or papilledema. Vision grossly normal. Ears:  External ear exam shows no significant lesions or deformities.  Otoscopic examination reveals clear canals, tympanic membranes are intact bilaterally without bulging, retraction, inflammation or discharge. Hearing is grossly normal bilaterally. Nose:  External nasal examination shows no deformity or inflammation. Nasal mucosa are pink and moist without lesions or exudates. Mouth:  Oral mucosa and oropharynx without lesions or exudates.  Teeth in good repair. Neck:  No deformities, masses, or tenderness noted. Breasts:  No mass, nodules, thickening, tenderness, bulging, retraction, inflamation, nipple discharge or skin changes noted.   Lungs:  Normal respiratory effort, chest  expands symmetrically. Lungs are clear to  auscultation, no crackles or wheezes. Heart:  reg S1/S2, no M/R/G Abdomen:  soft, NT/ND, + BS Genitalia:  deferred due to recent exam Pulses:  +2 carotid, radial, DP Extremities:  no C/C/E Neurologic:  No cranial nerve deficits noted. Station and gait are normal. Plantar reflexes are down-going bilaterally. DTRs are symmetrical throughout. Sensory, motor and coordinative functions appear intact. Skin:  hyperpigmented 1 cm mole on R abd remainder of skin normal Cervical Nodes:  No lymphadenopathy noted Axillary Nodes:  No palpable lymphadenopathy Psych:  Cognition and judgment appear intact. Alert and cooperative with normal attention span and concentration. No apparent delusions, illusions, hallucinations   Impression & Recommendations:  Problem # 1:  PHYSICAL EXAMINATION (ICD-V70.0) Assessment New pt's PE WNL.  all labs recently collected and reviewed w/ exception of TSH and lipids.  will refer for mammogram.  anticipatory guidance provided. Orders: TLB-TSH (Thyroid Stimulating Hormone) (84443-TSH) EKG w/ Interpretation (93000) Radiology Referral (Radiology)  Complete Medication List: 1)  Multivitamins Tabs (Multiple vitamin) .... Take one tablet daily 2)  Simvastatin 20 Mg Tabs (Simvastatin) .... Take one tablet daily 3)  Dexilant 60 Mg Cpdr (Dexlansoprazole) .... Take one tablet daily 4)  Fish Oil 1200 Mg Caps (Omega-3 fatty acids) .... Take one tablet daily 5)  Alprazolam 0.25 Mg Tabs (Alprazolam) .... Take one tablet as needed 6)  Venlafaxine Hcl 150 Mg Xr24h-tab (Venlafaxine hcl) .Marland Kitchen.. 1 tab daily  Other Orders: TLB-Lipid Panel (80061-LIPID) Venipuncture (16109)  Patient Instructions: 1)  Follow up in 6 months to recheck cholesterol 2)  We'll notify you of your lab results 3)  Call with any questions or concerns 4)  Keep up the good work!  You look great! 5)  Happy Holidays!!!   Orders Added: 1)  TLB-Lipid Panel  [80061-LIPID] 2)  TLB-TSH (Thyroid Stimulating Hormone) [84443-TSH] 3)  Venipuncture [36415] 4)  Est. Patient 40-64 years [99396] 5)  EKG w/ Interpretation [93000] 6)  Radiology Referral [Radiology]  Appended Document: cpx & lab/cbs

## 2010-03-05 NOTE — Progress Notes (Signed)
Summary: FYI- BP  Phone Note Call from Patient Call back at [phone]   Caller: Patient Summary of Call: PT WALKED IN AND FILLED OUT A NOTE THAT STATES: "JUST A NOTE TO THE DR. CONCERNING MY RECENT TRIP TO THE HOSPITAL. MY BLOOD PRESSURE WAS 172 AND HAS ONLY COME DOWN TO 150'S. IT COULD BE MY MEDS HOWEVER I'VE BEEN OFF THEM 48 HRS. THANK YOU * MY PRESSURE IS NORMALLY 120'S   PT STATES THAT THIS IS JUST A FYI Initial call taken by: Lavell Islam,  December 20, 2009 3:33 PM  Follow-up for Phone Call        Pt states that BP has been elevated for the pass few days. Pt was  recently on dioxyline which the pharmacy informed her may elevated BP some but should be out of system in 48 hours. Pt denies any Headache, SOB, chest pain, numbness or tingling. Pt advise OV need to check BP. Pt coming in tomorrow but advise to continue to monitor BP and symptoms. Pt also advise if she become symptomatic pt is to report ED, pt ok, verbalized understanding...........Marland KitchenScripps [facility] Deloach CMA  December 20, 2009 4:36 PM    Additional Follow-up for Phone Call Additional follow up Details #1::        noted Additional Follow-up by: Neena Rhymes MD,  December 21, 2009 8:06 AM

## 2010-03-06 ENCOUNTER — Encounter: Payer: Self-pay | Admitting: Family Medicine

## 2010-03-07 NOTE — Progress Notes (Signed)
Summary: test results  Phone Note Call from Patient Call back at [phone] CELL   Call For: Dr Arlyce Dice Reason for Call: Lab or Test Results Summary of Call: [facility] test results. Initial call taken by: Leanor Kail [facility],  January 21, 2010 1:14 PM  Follow-up for Phone Call        Patient is advised of lab results Follow-up by: Selinda Michaels RN,  January 21, 2010 2:46 PM

## 2010-03-07 NOTE — Assessment & Plan Note (Signed)
Summary: sinus infection//fd   Vital Signs:  Patient profile:   65 year old female Weight:      116 pounds BMI:     20.79 Temp:     98.2 degrees F oral BP sitting:   108 / 60  (left arm)  Vitals Entered By: Doristine Devoid CMA (February 27, 2010 1:47 PM) CC: sinus congestion and drainage along w/ HA pressure ears clogged and painful   History of Present Illness: 65 yo woman here today for ? sinus infxn.  sxs started Friday w/ cold sxs and now has pressure and pain across forehead, ears are clogged, + nasal drainage.  no tooth pain.  no fevers.  + sick contacts.  coughing up drainage.  Current Medications (verified): 1)  Multivitamins  Tabs (Multiple Vitamin) .... Take One Tablet Daily 2)  Simvastatin 20 Mg Tabs (Simvastatin) .... Take One Tablet Daily 3)  Dexilant 60 Mg Cpdr (Dexlansoprazole) .... Take One Tablet Daily 4)  Fish Oil 1200 Mg Caps (Omega-3 Fatty Acids) .... Take One Tablet Daily 5)  Alprazolam 0.25 Mg Tabs (Alprazolam) .... Take One Tablet As Needed 6)  Venlafaxine Hcl 150 Mg Xr24h-Tab (Venlafaxine Hcl) .Marland Kitchen.. 1 Tab Daily 7)  Hyomax-Sl 0.125 Mg Subl (Hyoscyamine Sulfate) .... Take 2 Tabs Sublingual Q.4 H. 8)  Amoxicillin 500 Mg Tabs (Amoxicillin) .... 2 Tabs By Mouth Two Times A Day X10 Day.  Take W/ Food.  Allergies (verified): 1)  ! Flagyl 2)  ! * Mycin  Review of Systems      See HPI  Physical Exam  General:  Well-developed,well-nourished,in no acute distress; alert,appropriate and cooperative throughout examination Head:  NCAT, + TTP over frontal sinuses, minimal TTP over maxillary sinuses Eyes:  no injxn or inflammation Ears:  L TM retracted Nose:  + congestion and rhinorrhea Mouth:  + PND w/ pharyngeal erythema Neck:  No deformities, masses, or tenderness noted. Lungs:  Normal respiratory effort, chest expands symmetrically. Lungs are clear to auscultation, no crackles or wheezes. Heart:  reg S1/S2, no M/R/G   Impression & Recommendations:  Problem #  1:  SINUSITIS - ACUTE-NOS (ICD-461.9) Assessment New pt's hx and PE consistent w/ bacterial infxn.  start amox.  reviewed supportive care and red flags that should prompt return.  Pt expresses understanding and is in agreement w/ this plan. Her updated medication list for this problem includes:    Amoxicillin 500 Mg Tabs (Amoxicillin) .Marland Kitchen... 2 tabs by mouth two times a day x10 day.  take w/ food.  Complete Medication List: 1)  Multivitamins Tabs (Multiple vitamin) .... Take one tablet daily 2)  Simvastatin 20 Mg Tabs (Simvastatin) .... Take one tablet daily 3)  Dexilant 60 Mg Cpdr (Dexlansoprazole) .... Take one tablet daily 4)  Fish Oil 1200 Mg Caps (Omega-3 fatty acids) .... Take one tablet daily 5)  Alprazolam 0.25 Mg Tabs (Alprazolam) .... Take one tablet as needed 6)  Venlafaxine Hcl 150 Mg Xr24h-tab (Venlafaxine hcl) .Marland Kitchen.. 1 tab daily 7)  Hyomax-sl 0.125 Mg Subl (Hyoscyamine sulfate) .... Take 2 tabs sublingual q.4 h. 8)  Amoxicillin 500 Mg Tabs (Amoxicillin) .... 2 tabs by mouth two times a day x10 day.  take w/ food.  Patient Instructions: 1)  You have a sinus infection 2)  Start the Amoxicillin as directed- take w/ food to avoid upset stomach 3)  Drink plenty of fluids 4)  Ibuprofen as needed for fever or headache 5)  Add Mucinex to thin your congestion 6)  Call with any questions  or concerns 7)  Hang in there! Prescriptions: AMOXICILLIN 500 MG TABS (AMOXICILLIN) 2 tabs by mouth two times a day x10 day.  take w/ food.  #40 x 0   Entered and Authorized by:   Neena Rhymes MD   Signed by:   Neena Rhymes MD on 02/27/2010   Method used:   Electronically to        [facility] [phone]* (retail)       [address]       [place], [place]  [address]       Ph: [phone]       Fax: [phone]   RxID:   [id number]    Orders Added: 1)  Est. Patient Level III [27[address]]

## 2010-03-07 NOTE — Letter (Signed)
Summary: [facility]  [facility]   Imported By: Sherian Rein 01/15/2010 14:55:00  _____________________________________________________________________  External Attachment:    Type:   Image     Comment:   External Document

## 2010-03-07 NOTE — Progress Notes (Signed)
Summary: Refill Request  Phone Note Refill Request Call back at [phone] Message from:  Pharmacy on February 25, 2010 9:09 AM  Refills Requested: Medication #1:  ALPRAZOLAM 0.25 MG TABS take one tablet as needed   Dosage confirmed as above?Dosage Confirmed   Supply Requested: 30   Last Refilled: 01/10/2010 [facility] [address]  Next Appointment Scheduled: none Initial call taken by: Harold Barban,  February 25, 2010 9:11 AM    Prescriptions: ALPRAZOLAM 0.25 MG TABS (ALPRAZOLAM) take one tablet as needed  #30 x 0   Entered by:   Doristine Devoid CMA   Authorized by:   Neena Rhymes MD   Signed by:   Doristine Devoid CMA on 02/25/2010   Method used:   Printed then faxed to ...       [facility]  [address] [phone]* (retail)       [address]       [place], [place]  [address]       Ph: [phone]       Fax: [phone]   RxID:   [id number]

## 2010-03-07 NOTE — Assessment & Plan Note (Signed)
Summary: sob//FD   Vital Signs:  Patient profile:   65 year old female Weight:      116 pounds BMI:     20.79 O2 Sat:      96 % on Room air Pulse rate:   82 / minute BP sitting:   120 / 70  (left arm)  Vitals Entered By: Doristine Devoid CMA (February 20, 2010 2:46 PM)  O2 Flow:  Room air CC: SOB w/ activity xfew weeks    History of Present Illness: 65 yo woman here today for SOB.  sxs started 3 weeks ago.  hx of PNA.  'i don't feel sick'.  will have cough and shortness of breath when spraying things like perfumes or bathroom cleaners.  father had lung cancer.  sxs are intermittant.  admits to increased anxiety.  denies cough.  no fevers.  hx of tobacco use- quit 20 yrs ago after 20 yrs of smoking.  no edema.  no CP.  Preventive Screening-Counseling & Management  Alcohol-Tobacco     Smoking Status: quit  Current Medications (verified): 1)  Multivitamins  Tabs (Multiple Vitamin) .... Take One Tablet Daily 2)  Simvastatin 20 Mg Tabs (Simvastatin) .... Take One Tablet Daily 3)  Dexilant 60 Mg Cpdr (Dexlansoprazole) .... Take One Tablet Daily 4)  Fish Oil 1200 Mg Caps (Omega-3 Fatty Acids) .... Take One Tablet Daily 5)  Alprazolam 0.25 Mg Tabs (Alprazolam) .... Take One Tablet As Needed 6)  Venlafaxine Hcl 150 Mg Xr24h-Tab (Venlafaxine Hcl) .Marland Kitchen.. 1 Tab Daily 7)  Hyomax-Sl 0.125 Mg Subl (Hyoscyamine Sulfate) .... Take 2 Tabs Sublingual Q.4 H.  Allergies (verified): 1)  ! Flagyl 2)  ! * Mycin  Family History: Family History of Alcoholism/Addiction-father Family History of Arthritis-mother CAD-brother,grandparents HTN-grandparents DM-grandparents STROKE-grandparents COLON CA-no BREAST CA-no Lung CA- father  Review of Systems      See HPI  Physical Exam  General:  Well-developed,well-nourished,in no acute distress; alert,appropriate and cooperative throughout examination Nose:  External nasal examination shows no deformity or inflammation. Nasal mucosa are pink and moist  without lesions or exudates. Mouth:  Oral mucosa and oropharynx without lesions or exudates.  Teeth in good repair. Neck:  No deformities, masses, or tenderness noted. Lungs:  Normal respiratory effort, chest expands symmetrically. Lungs are clear to auscultation, no crackles or wheezes. Heart:  reg S1/S2, no M/R/G   Impression & Recommendations:  Problem # 1:  SHORTNESS OF BREATH (ICD-786.05) Assessment New no red flags on hx or PE.  given pt's tobacco hx must get CXR to r/o mass.  CXR would also be able to evaluate for chronic changes.  sample of ProAir given to see if this improves sxs.  reviewed supportive care and red flags that should prompt return.  Pt expresses understanding and is in agreement w/ this plan. Orders: T-2 View CXR (71020TC)  Complete Medication List: 1)  Multivitamins Tabs (Multiple vitamin) .... Take one tablet daily 2)  Simvastatin 20 Mg Tabs (Simvastatin) .... Take one tablet daily 3)  Dexilant 60 Mg Cpdr (Dexlansoprazole) .... Take one tablet daily 4)  Fish Oil 1200 Mg Caps (Omega-3 fatty acids) .... Take one tablet daily 5)  Alprazolam 0.25 Mg Tabs (Alprazolam) .... Take one tablet as needed 6)  Venlafaxine Hcl 150 Mg Xr24h-tab (Venlafaxine hcl) .Marland Kitchen.. 1 tab daily 7)  Hyomax-sl 0.125 Mg Subl (Hyoscyamine sulfate) .... Take 2 tabs sublingual q.4 h.  Patient Instructions: 1)  Go to the [facility] on [address] for your chest xray-  we'll call you with the results 2)  Use the inhaler- 2 puffs as needed- for shortness of breath 3)  Your lung exam sounds great!  This is good news! 4)  This may be anxiety related- we'll keep an eye on your symptoms 5)  Call if no improvement in the next 2 weeks 6)  Hang in there!   Orders Added: 1)  T-2 View CXR [71020TC] 2)  Est. Patient Level III [[address]]

## 2010-03-07 NOTE — Letter (Signed)
Summary: [facility]  [facility]   Imported By: Sherian Rein 01/15/2010 14:53:35  _____________________________________________________________________  External Attachment:    Type:   Image     Comment:   External Document

## 2010-03-08 ENCOUNTER — Telehealth (INDEPENDENT_AMBULATORY_CARE_PROVIDER_SITE_OTHER): Payer: Self-pay | Admitting: *Deleted

## 2010-03-13 ENCOUNTER — Encounter: Payer: Self-pay | Admitting: Family Medicine

## 2010-03-13 ENCOUNTER — Other Ambulatory Visit: Payer: Self-pay | Admitting: Family Medicine

## 2010-03-13 ENCOUNTER — Ambulatory Visit (INDEPENDENT_AMBULATORY_CARE_PROVIDER_SITE_OTHER): Payer: Self-pay | Admitting: Family Medicine

## 2010-03-13 DIAGNOSIS — R5383 Other fatigue: Secondary | ICD-10-CM

## 2010-03-13 DIAGNOSIS — R5381 Other malaise: Secondary | ICD-10-CM

## 2010-03-13 LAB — CBC WITH DIFFERENTIAL/PLATELET
Basophils Absolute: 0 10*3/uL (ref 0.0–0.1)
Basophils Relative: 0.6 % (ref 0.0–3.0)
Eosinophils Absolute: 0.1 10*3/uL (ref 0.0–0.7)
Eosinophils Relative: 3.1 % (ref 0.0–5.0)
HCT: 37.5 % (ref 36.0–46.0)
Hemoglobin: 13.1 g/dL (ref 12.0–15.0)
Lymphocytes Relative: 42.5 % (ref 12.0–46.0)
Lymphs Abs: 1.8 10*3/uL (ref 0.7–4.0)
MCHC: 35 g/dL (ref 30.0–36.0)
MCV: 93.7 fl (ref 78.0–100.0)
Monocytes Absolute: 0.3 10*3/uL (ref 0.1–1.0)
Monocytes Relative: 7 % (ref 3.0–12.0)
Neutro Abs: 1.9 10*3/uL (ref 1.4–7.7)
Neutrophils Relative %: 46.8 % (ref 43.0–77.0)
Platelets: 237 10*3/uL (ref 150.0–400.0)
RBC: 4.01 Mil/uL (ref 3.87–5.11)
RDW: 13.6 % (ref 11.5–14.6)
WBC: 4.1 10*3/uL — ABNORMAL LOW (ref 4.5–10.5)

## 2010-03-13 NOTE — Progress Notes (Signed)
Summary: still no better  Phone Note Call from Patient Call back at [phone]   Caller: Patient Summary of Call: Pt seen on 02-27-10 for sinus infection and is due to finished antibiotics today. Pt still c/o congestion, fullness in ears, cough, and reddish mucous. [facility] use [facility]. Pls advise.........Marland KitchenFelecia Deloach CMA  March 08, 2010 9:23 AM    Follow-up for Phone Call        ceftin 500 mg 1 by mouth two times a day for 10days--will need ov after that if no better Follow-up by: Loreen Freud DO,  March 08, 2010 10:38 AM  Additional Follow-up for Phone Call Additional follow up Details #1::        Pt aware, Rx sent to pharmacy..........Marland KitchenFelecia Deloach CMA  March 08, 2010 2:47 PM     New/Updated Medications: CEFTIN 500 MG TABS (CEFUROXIME AXETIL) Take 1 by mouth two times a day for 10days Prescriptions: CEFTIN 500 MG TABS (CEFUROXIME AXETIL) Take 1 by mouth two times a day for 10days  #20 x 0   Entered by:   Jeremy Johann CMA   Authorized by:   Neena Rhymes MD   Signed by:   Jeremy Johann CMA on 03/08/2010   Method used:   Faxed to ...       [facility]  [address] [phone]* (retail)       [address]       [place], [place]  [address]       Ph: [phone]       Fax: [phone]   RxID:   [id number]

## 2010-03-21 NOTE — Assessment & Plan Note (Signed)
Summary: drainage,body ache/cbs   Vital Signs:  Patient profile:   65 year old female Weight:      115 pounds BMI:     20.61 Temp:     98.4 degrees F oral Pulse rate:   76 / minute BP sitting:   108 / 72  (left arm)  Vitals Entered By: Doristine Devoid CMA (March 13, 2010 11:06 AM) CC: bodyaches and feeling run down    History of Present Illness: 65 yo woman here today for bodyaches and fatigue.  reports she 'never got better' from recent sinus infxn.  sinuses have improved but feeling 'rundown'.  having occasional loose stools.  has had increased stress recently.  some nausea, abd cramping.    Current Medications (verified): 1)  Multivitamins  Tabs (Multiple Vitamin) .... Take One Tablet Daily 2)  Simvastatin 20 Mg Tabs (Simvastatin) .... Take One Tablet Daily 3)  Dexilant 60 Mg Cpdr (Dexlansoprazole) .... Take One Tablet Daily 4)  Fish Oil 1200 Mg Caps (Omega-3 Fatty Acids) .... Take One Tablet Daily 5)  Alprazolam 0.25 Mg Tabs (Alprazolam) .... Take One Tablet As Needed 6)  Venlafaxine Hcl 150 Mg Xr24h-Tab (Venlafaxine Hcl) .Marland Kitchen.. 1 Tab Daily 7)  Hyomax-Sl 0.125 Mg Subl (Hyoscyamine Sulfate) .... Take 2 Tabs Sublingual Q.4 H. 8)  Ceftin 500 Mg Tabs (Cefuroxime Axetil) .... Take 1 By Mouth Two Times A Day For 10days 9)  Promethazine Hcl 25 Mg  Tabs (Promethazine Hcl) .Marland Kitchen.. 1 Tab By Mouth Q6 As Needed For Nausea  Allergies (verified): 1)  ! Flagyl 2)  ! * Mycin  Review of Systems      See HPI  Physical Exam  General:  Well-developed,well-nourished,in no acute distress; alert,appropriate and cooperative throughout examination Head:  NCAT, no TTP over sinuses Eyes:  no injxn or inflammation Ears:  External ear exam shows no significant lesions or deformities.  Otoscopic examination reveals clear canals, tympanic membranes are intact bilaterally without bulging, retraction, inflammation or discharge. Hearing is grossly normal bilaterally. Nose:  External nasal examination shows  no deformity or inflammation. Nasal mucosa are pink and moist without lesions or exudates. Mouth:  Oral mucosa and oropharynx without lesions or exudates.  Teeth in good repair. Neck:  No deformities, masses, or tenderness noted. Lungs:  Normal respiratory effort, chest expands symmetrically. Lungs are clear to auscultation, no crackles or wheezes. Heart:  reg S1/S2, no M/R/G Abdomen:  soft, NT/ND, + BS Pulses:  +2 carotid, radial, DP Extremities:  no C/C/E   Impression & Recommendations:  Problem # 1:  MALAISE AND FATIGUE (ICD-780.79) Assessment New pt's vague sxs most likely viral.  no obvious bacterial infxn on PE.  check labs to r/o infection or anemia.  reviewed supportive care and red flags that should prompt return.  Pt expresses understanding and is in agreement w/ this plan. Orders: Venipuncture (45409) Specimen Handling (81191) TLB-CBC Platelet - w/Differential (85025-CBCD)  Complete Medication List: 1)  Multivitamins Tabs (Multiple vitamin) .... Take one tablet daily 2)  Simvastatin 20 Mg Tabs (Simvastatin) .... Take one tablet daily 3)  Dexilant 60 Mg Cpdr (Dexlansoprazole) .... Take one tablet daily 4)  Fish Oil 1200 Mg Caps (Omega-3 fatty acids) .... Take one tablet daily 5)  Alprazolam 0.25 Mg Tabs (Alprazolam) .... Take one tablet as needed 6)  Venlafaxine Hcl 150 Mg Xr24h-tab (Venlafaxine hcl) .Marland Kitchen.. 1 tab daily 7)  Hyomax-sl 0.125 Mg Subl (Hyoscyamine sulfate) .... Take 2 tabs sublingual q.4 h. 8)  Ceftin 500 Mg Tabs (Cefuroxime  axetil) .... Take 1 by mouth two times a day for 10days 9)  Promethazine Hcl 25 Mg Tabs (Promethazine hcl) .Marland Kitchen.. 1 tab by mouth q6 as needed for nausea  Patient Instructions: 1)  This appears to be viral and should improve w/ time 2)  Drink plenty of fluids 3)  REST! 4)  Use the promethazine as needed for nausea 5)  Add Claritin or Zyrtec for allergies (available over the counter) 6)  Call with any questions or concerns 7)  Hang in  there! Prescriptions: PROMETHAZINE HCL 25 MG  TABS (PROMETHAZINE HCL) 1 tab by mouth Q6 as needed for nausea  #20 x 0   Entered and Authorized by:   Neena Rhymes MD   Signed by:   Neena Rhymes MD on 03/13/2010   Method used:   Electronically to        [facility] [phone]* (retail)       [address]       [place], [place]  [address]       Ph: [phone]       Fax: [phone]   RxID:   [id number]    Orders Added: 1)  Venipuncture [43329] 2)  Specimen Handling [99000] 3)  TLB-CBC Platelet - w/Differential [85025-[id number]] 4)  Est. Patient Level III [51884]

## 2010-03-27 ENCOUNTER — Encounter: Payer: Self-pay | Admitting: Family Medicine

## 2010-03-27 ENCOUNTER — Ambulatory Visit (INDEPENDENT_AMBULATORY_CARE_PROVIDER_SITE_OTHER): Payer: Self-pay | Admitting: Family Medicine

## 2010-03-27 DIAGNOSIS — J309 Allergic rhinitis, unspecified: Secondary | ICD-10-CM

## 2010-03-29 ENCOUNTER — Telehealth: Payer: Self-pay | Admitting: Family Medicine

## 2010-04-02 NOTE — Progress Notes (Signed)
Summary: Refill  Phone Note Refill Request Call back at [phone] Message from:  Pharmacy on March 29, 2010 10:39 AM  Refills Requested: Medication #1:  ALPRAZOLAM 0.25 MG TABS take one tablet as needed   Dosage confirmed as above?Dosage Confirmed   Supply Requested: 1 month [facility] [address]  Next Appointment Scheduled: none Initial call taken by: Lavell Islam,  March 29, 2010 10:40 AM  Follow-up for Phone Call        last filled1-23-12, last ov 03-13-10.Felecia Deloach CMA  March 29, 2010 11:58 AM   Additional Follow-up for Phone Call Additional follow up Details #1::        ok for #30, 1 refill Additional Follow-up by: Neena Rhymes MD,  March 29, 2010 12:02 PM    Prescriptions: ALPRAZOLAM 0.25 MG TABS (ALPRAZOLAM) take one tablet as needed  #30 x 1   Entered by:   Jeremy Johann CMA   Authorized by:   Neena Rhymes MD   Signed by:   Jeremy Johann CMA on 03/29/2010   Method used:   Printed then faxed to ...       [facility]  [address] [phone]* (retail)       [address]       [place], [place]  [address]       Ph: [phone]       Fax: [phone]   RxID:   [id number]

## 2010-04-02 NOTE — Assessment & Plan Note (Signed)
Summary: congested/cbs   History of Present Illness: 65 yo woman here today w/ laryngitis.  lost voice last night.  prior to that reports increased nasal drainage and PND- 'green'.  no fevers.  mild cough.  no ear pain, facial pain.  not currently taking allergy meds.  Allergies: 1)  ! Flagyl 2)  ! * Mycin  Review of Systems      See HPI  Physical Exam  General:  Well-developed,well-nourished,in no acute distress; alert,appropriate and cooperative throughout examination Head:  NCAT, no TTP over sinuses Eyes:  no injxn or inflammation Ears:  External ear exam shows no significant lesions or deformities.  Otoscopic examination reveals clear canals, tympanic membranes are intact bilaterally without bulging, retraction, inflammation or discharge. Hearing is grossly normal bilaterally. Nose:  edematous turbinates Mouth:  + PND Neck:  No deformities, masses, or tenderness noted. Lungs:  Normal respiratory effort, chest expands symmetrically. Lungs are clear to auscultation, no crackles or wheezes. Heart:  reg S1/S2, no M/R/G   Impression & Recommendations:  Problem # 1:  ALLERGIC RHINITIS (ICD-477.9) Assessment Unchanged pt's sxs most likely related to untreated seasonal allergies.  no evidence of bacterial infxn on exam.  start nasal steroid spray.  add OTC antihistamine.  reviewed supportive care and red flags that should prompt return.  Pt expresses understanding and is in agreement w/ this plan. The following medications were removed from the medication list:    Promethazine Hcl 25 Mg Tabs (Promethazine hcl) .Marland Kitchen... 1 tab by mouth q6 as needed for nausea Her updated medication list for this problem includes:    Fluticasone Propionate 50 Mcg/act Susp (Fluticasone propionate) .Marland Kitchen... 2 sprays each nostril once daily  Complete Medication List: 1)  Multivitamins Tabs (Multiple vitamin) .... Take one tablet daily 2)  Simvastatin 20 Mg Tabs (Simvastatin) .... Take one tablet daily 3)   Dexilant 60 Mg Cpdr (Dexlansoprazole) .... Take one tablet daily 4)  Fish Oil 1200 Mg Caps (Omega-3 fatty acids) .... Take one tablet daily 5)  Alprazolam 0.25 Mg Tabs (Alprazolam) .... Take one tablet as needed 6)  Venlafaxine Hcl 150 Mg Xr24h-tab (Venlafaxine hcl) .Marland Kitchen.. 1 tab daily 7)  Hyomax-sl 0.125 Mg Subl (Hyoscyamine sulfate) .... Take 2 tabs sublingual q.4 h. 8)  Fluticasone Propionate 50 Mcg/act Susp (Fluticasone propionate) .... 2 sprays each nostril once daily  Patient Instructions: 1)  This all appears to be allergy related 2)  Start the Zyrtec daily 3)  Use the nasal spray as directed to decrease post nasal drip and congestion 4)  Ibuprofen 400mg  (2 pills) three times a day for your vocal cord inflammation 5)  Drink plenty of fluids 6)  Hang in there! Prescriptions: FLUTICASONE PROPIONATE 50 MCG/ACT  SUSP (FLUTICASONE PROPIONATE) 2 sprays each nostril once daily  #1 x 3   Entered and Authorized by:   Neena Rhymes MD   Signed by:   Neena Rhymes MD on 03/27/2010   Method used:   Electronically to        [facility] [phone]* (retail)       [address]       [place], [place]  [address]       Ph: [phone]       Fax: [phone]   RxID:   [id number]    Orders Added: 1)  Est. Patient Level III [27253]

## 2010-04-09 ENCOUNTER — Other Ambulatory Visit: Payer: Self-pay | Admitting: Family Medicine

## 2010-04-09 ENCOUNTER — Telehealth: Payer: Self-pay | Admitting: Family Medicine

## 2010-04-09 DIAGNOSIS — Z1231 Encounter for screening mammogram for malignant neoplasm of breast: Secondary | ICD-10-CM

## 2010-04-15 ENCOUNTER — Ambulatory Visit: Payer: Self-pay | Admitting: Physician Assistant

## 2010-04-15 ENCOUNTER — Encounter: Payer: Self-pay | Admitting: Physician Assistant

## 2010-04-15 ENCOUNTER — Other Ambulatory Visit: Payer: Self-pay | Admitting: Physician Assistant

## 2010-04-15 ENCOUNTER — Ambulatory Visit (INDEPENDENT_AMBULATORY_CARE_PROVIDER_SITE_OTHER): Payer: Self-pay | Admitting: Physician Assistant

## 2010-04-15 ENCOUNTER — Telehealth: Payer: Self-pay | Admitting: Gastroenterology

## 2010-04-15 ENCOUNTER — Other Ambulatory Visit: Payer: Self-pay

## 2010-04-15 DIAGNOSIS — R143 Flatulence: Secondary | ICD-10-CM

## 2010-04-15 DIAGNOSIS — R141 Gas pain: Secondary | ICD-10-CM

## 2010-04-15 DIAGNOSIS — K589 Irritable bowel syndrome without diarrhea: Secondary | ICD-10-CM | POA: Insufficient documentation

## 2010-04-15 DIAGNOSIS — K219 Gastro-esophageal reflux disease without esophagitis: Secondary | ICD-10-CM

## 2010-04-15 DIAGNOSIS — R197 Diarrhea, unspecified: Secondary | ICD-10-CM | POA: Insufficient documentation

## 2010-04-15 DIAGNOSIS — R142 Eructation: Secondary | ICD-10-CM | POA: Insufficient documentation

## 2010-04-15 LAB — CBC WITH DIFFERENTIAL/PLATELET
Basophils Absolute: 0 10*3/uL (ref 0.0–0.1)
Basophils Relative: 1 % (ref 0.0–3.0)
Eosinophils Absolute: 0.1 10*3/uL (ref 0.0–0.7)
Eosinophils Relative: 3 % (ref 0.0–5.0)
HCT: 38.5 % (ref 36.0–46.0)
Hemoglobin: 13.3 g/dL (ref 12.0–15.0)
Lymphocytes Relative: 40.1 % (ref 12.0–46.0)
Lymphs Abs: 1.7 10*3/uL (ref 0.7–4.0)
MCHC: 34.6 g/dL (ref 30.0–36.0)
MCV: 94.3 fl (ref 78.0–100.0)
Monocytes Absolute: 0.3 10*3/uL (ref 0.1–1.0)
Monocytes Relative: 7.7 % (ref 3.0–12.0)
Neutro Abs: 2.1 10*3/uL (ref 1.4–7.7)
Neutrophils Relative %: 48.2 % (ref 43.0–77.0)
Platelets: 232 10*3/uL (ref 150.0–400.0)
RBC: 4.08 Mil/uL (ref 3.87–5.11)
RDW: 13.4 % (ref 11.5–14.6)
WBC: 4.3 10*3/uL — ABNORMAL LOW (ref 4.5–10.5)

## 2010-04-15 LAB — HIGH SENSITIVITY CRP: CRP, High Sensitivity: 1.01 mg/L (ref 0.00–5.00)

## 2010-04-16 ENCOUNTER — Encounter: Payer: Self-pay | Admitting: Physician Assistant

## 2010-04-16 LAB — COMPREHENSIVE METABOLIC PANEL
ALT: 30 U/L (ref 0–35)
AST: 35 U/L (ref 0–37)
Albumin: 4.3 g/dL (ref 3.5–5.2)
Alkaline Phosphatase: 68 U/L (ref 39–117)
BUN: 15 mg/dL (ref 6–23)
CO2: 27 mEq/L (ref 19–32)
Calcium: 9.8 mg/dL (ref 8.4–10.5)
Chloride: 104 mEq/L (ref 96–112)
Creatinine, Ser: 0.9 mg/dL (ref 0.4–1.2)
GFR calc Af Amer: >60 mL/min (ref >60)
GFR calc non Af Amer: >60 mL/min (ref >60)
Glucose, Bld: 86 mg/dL (ref 70–99)
Potassium: 3.7 mEq/L (ref 3.5–5.1)
Sodium: 143 mEq/L (ref 135–145)
Total Bilirubin: 0.5 mg/dL (ref 0.3–1.2)
Total Protein: 6.9 g/dL (ref 6.0–8.3)

## 2010-04-16 LAB — DIFFERENTIAL
Basophils Absolute: 0.1 10*3/uL (ref 0.0–0.1)
Basophils Relative: 2 % — ABNORMAL HIGH (ref 0–1)
Eosinophils Absolute: 0.2 10*3/uL (ref 0.0–0.7)
Eosinophils Relative: 5 % (ref 0–5)
Lymphocytes Relative: 43 % (ref 12–46)
Lymphs Abs: 1.8 10*3/uL (ref 0.7–4.0)
Monocytes Absolute: 0.3 10*3/uL (ref 0.1–1.0)
Monocytes Relative: 8 % (ref 3–12)
Neutro Abs: 1.7 10*3/uL (ref 1.7–7.7)
Neutrophils Relative %: 42 % — ABNORMAL LOW (ref 43–77)

## 2010-04-16 LAB — CBC
HCT: 40 % (ref 36.0–46.0)
Hemoglobin: 13.9 g/dL (ref 12.0–15.0)
MCH: 32.5 pg (ref 26.0–34.0)
MCHC: 34.6 g/dL (ref 30.0–36.0)
MCV: 94 fL (ref 78.0–100.0)
Platelets: 210 10*3/uL (ref 150–400)
RBC: 4.26 MIL/uL (ref 3.87–5.11)
RDW: 12.4 % (ref 11.5–15.5)
WBC: 4.1 10*3/uL (ref 4.0–10.5)

## 2010-04-16 LAB — LIPASE, BLOOD: Lipase: 264 U/L (ref 23–300)

## 2010-04-16 NOTE — Progress Notes (Signed)
Summary: left over med  Phone Note Refill Request Call back at [phone]   Refills Requested: Medication #1:  pristiq 50mg  1 tab daily  Medication #2:  VENLAFAXINE HCL 150 MG XR24H-TAB 1 tab daily Pt states that she has some left over pristiq and doesn't want to waste it. Pt notes that dr Beverely Low told her once before that she could just take 2 of the pristiq because they contain the same ingredients as the effexor. Pls advise...........Marland KitchenFelecia Deloach CMA  April 09, 2010 8:28 AM  Pt would also like to get a mammogram done last done 03-2009.Marland KitchenMarland KitchenFelecia Deloach CMA  April 09, 2010 8:25 AM    Follow-up for Phone Call        yes, she can take 2 of the Pristiq she has left and then start the generic Venlafaxine when she finishes the Pristiq Follow-up by: Neena Rhymes MD,  April 09, 2010 9:54 AM  Additional Follow-up for Phone Call Additional follow up Details #1::        pls advise mammogram..Marland KitchenFelecia Deloach CMA  April 09, 2010 10:47 AM     Additional Follow-up for Phone Call Additional follow up Details #2::    mammogram was ordered when pt was here in November.  appt was scheduled for January per renee's notes.  can re-enter order if pt missed appt.  pt needs mammogram. Follow-up by: Neena Rhymes MD,  April 09, 2010 11:14 AM  Additional Follow-up for Phone Call Additional follow up Details #3:: Details for Additional Follow-up Action Taken: [facility]oke with [facility] about scholarship program to help cover cost of procedure for Pt. Per women will mail application to Pt once info received they will be in contact with [place] in regard to orders and appt. Pt aware.Felecia Deloach CMA  April 09, 2010 11:40 AM

## 2010-04-17 ENCOUNTER — Encounter: Payer: Self-pay | Admitting: Physician Assistant

## 2010-04-18 ENCOUNTER — Telehealth: Payer: Self-pay | Admitting: Physician Assistant

## 2010-04-19 ENCOUNTER — Ambulatory Visit: Payer: Self-pay

## 2010-04-22 ENCOUNTER — Telehealth: Payer: Self-pay | Admitting: Gastroenterology

## 2010-04-23 ENCOUNTER — Telehealth: Payer: Self-pay | Admitting: Gastroenterology

## 2010-04-23 NOTE — Assessment & Plan Note (Signed)
Summary: Diarrhea/bloating/No show copay/[facility]   History of Present Illness Visit Type: Follow-up Visit Primary GI MD: Melvia Heaps MD [facility] Primary Provider: Neena Rhymes, MD Requesting Provider: na Chief Complaint: Pt c/o severe gas, loose stools or diarrhea, and upper abd pain  History of Present Illness:   PLEASANT 65 YO FEMALE KNOWN TO DR. KAPLAN. SHE HAS GERD,TREATED H.PYLORI, AND HX OF BARRETTS  THOUGH NO EVIDENCE ON LAST EGD.  SHE HAS A LONG HX OF IBS BY HER REPORT. LAST COLONOSCOPY WAS IN 7/ 2003;NEGATIVE EXCEPT EXTERNAL HEMORRHOIDS.SHE SAYS SHE NORMALLY TENDS TOWARD CONSTIPATION. SHE HAS NOW HAD 8 DAYS OF GAS BLOATING, BELCHING, AND DIARRHEA WITH URGENCY.MALODOROUS STOOLS. NO FEVER. NO NAUSE OR VOMITING. NO BLOOD IN STOOLS. SHE IS USING BENTYL TWICE DAILY WHICH DOES HELP. SHE IS UNSURE WHEN SHE LAST TOOK AN ANTIBIOTIC BUT BELIEVES IT WAS THIS YEAR.  SHE WAS RECENTLY FOUND TO HAVE PANCREATIC AND HEPATIC CYSTS ON [place] AND IS WORRIED ABOUT THAT AS WELL.   GI Review of Systems    Reports abdominal pain and  nausea.     Location of  Abdominal pain: upper abdomen.    Denies acid reflux, belching, bloating, chest pain, dysphagia with liquids, dysphagia with solids, heartburn, loss of appetite, vomiting, vomiting blood, weight loss, and  weight gain.      Reports diarrhea.     Denies anal fissure, black tarry stools, change in bowel habit, constipation, diverticulosis, fecal incontinence, heme positive stool, hemorrhoids, irritable bowel syndrome, jaundice, light color stool, liver problems, rectal bleeding, and  rectal pain.    Current Medications (verified): 1)  Multivitamins  Tabs (Multiple Vitamin) .... Take One Tablet Daily 2)  Simvastatin 20 Mg Tabs (Simvastatin) .... Take One Tablet Daily 3)  Dexilant 60 Mg Cpdr (Dexlansoprazole) .... Take One Tablet Daily 4)  Fish Oil 1200 Mg Caps (Omega-3 Fatty Acids) .... Take One Tablet Daily 5)  Alprazolam 0.25 Mg Tabs (Alprazolam) .... Take  One Tablet As Needed 6)  Venlafaxine Hcl 150 Mg Xr24h-Tab (Venlafaxine Hcl) .Marland Kitchen.. 1 Tab Daily 7)  Fluticasone Propionate 50 Mcg/act  Susp (Fluticasone Propionate) .... 2 Sprays Each Nostril Once Daily 8)  Bentyl 10 Mg Caps (Dicyclomine Hcl) .... As Needed  Allergies (verified): 1)  ! Flagyl 2)  ! * Mycin  Past History:  Past Medical History: Allergic rhinitis Anemia-NOS Depression Chronic Bronchitis Hyperlipidemia Barrett's Esophagus/LAST BX NEGATIVE Hemorrhoids Hx of H Pylori-TREATED GERD  Past Surgical History: Cyst on Breast removed-Bilateral Endometrial cysts removed Appendectomy Tonsillectomy COLONOSCOPY 2003-KAPLAN  Family History: Reviewed history from 02/20/2010 and no changes required. Family History of Alcoholism/Addiction-father Family History of Arthritis-mother CAD-brother,grandparents HTN-grandparents DM-grandparents STROKE-grandparents COLON CA-no BREAST CA-no Lung CA- father  Social History: Reviewed history from 01/09/2010 and no changes required. lives w/ husband, mother, youngest daughter and grandson works partime as Scientist, physiological at [employer]. 'love/hate relationship' w/ husband, he's a functional alcoholic  Review of Systems       The patient complains of fatigue and headaches-new.  The patient denies allergy/sinus, anemia, anxiety-new, arthritis/joint pain, back pain, blood in urine, breast changes/lumps, change in vision, confusion, cough, coughing up blood, depression-new, fainting, fever, hearing problems, heart murmur, heart rhythm changes, itching, menstrual pain, muscle pains/cramps, night sweats, nosebleeds, pregnancy symptoms, shortness of breath, skin rash, sleeping problems, sore throat, swelling of feet/legs, swollen lymph glands, thirst - excessive , urination - excessive , urination changes/pain, urine leakage, vision changes, and voice change.         SEE HPI  Vital Signs:  Patient profile:   65 year old female Height:       62.75 inches Weight:      115 pounds BMI:     20.61 BSA:     1.53 Pulse rate:   68 / minute Pulse rhythm:   regular BP sitting:   110 / 74  (left arm) Cuff size:   regular  Vitals Entered By: Ok Anis CMA (April 15, 2010 2:21 PM)  Physical Exam  General:  Well developed, well nourished, no acute distress. Head:  Normocephalic and atraumatic. Eyes:  PERRLA, no icterus. Lungs:  Clear throughout to auscultation. Heart:  Regular rate and rhythm; no murmurs, rubs,  or bruits. Abdomen:  SOFT, BASIBASICALLY NONTENDER, NO MASS OR HSM, BS+ Rectal:  NOT DONE Extremities:  No clubbing, cyanosis, edema or deformities noted. Neurologic:  Alert and  oriented x4;  grossly normal neurologically. Psych:  Alert and cooperative. Normal mood and affect.   Impression & Recommendations:  Problem # 1:  DIARRHEA (ICD-787.91) Assessment New 65 YO FEMALE WITH 8 DAY HX OF DIARRHEA, GAS ABDOMINAL DISCOMFORT. R/O IBS EXACERBATION, R/O VIRAL SYNDROME, R/O C.DIFF.   BLAND DIET INCREASE BENTYL TO 3 TIMES DAILY ADD ALIGN ONE DAILY X 30 DAYS LABS AS BELOW STOOL STUDIES DISCUSSED FOLLOW UP COLONOSCOPY IF SXS PERSIST  Problem # 2:  GERD (ICD-530.81) Assessment: Comment Only STABLE ON DEXILANT 60 MG DAILY  Problem # 3:  HELICOBACTER PYLORI GASTRITIS, HX OF (ICD-V12.79) Assessment: Comment Only TREATED FALL 2011, AND FOLLOW UP STOOL AG NEGATIVE  Problem # 4:  HX OF BARRETTS ESOPHAGUS Assessment: Comment Only FOLLOW UP EGD 2013 PER DR. KAPLAN  Other Orders: TLB-CRP-High Sensitivity (C-Reactive Protein) (86140-FCRP) TLB-CBC Platelet - w/Differential ([id number]) T-Culture, Stool ([id number]) T-C diff by PCR ([id number]) T-Stool Giardia / Crypto- EIA (82956) T-Fecal WBC ([id number])  Patient Instructions: 1)  Please go to lab, basement level. 2)  We have given you samples of Align capsule, take 1 daily x 4 weeks.  3)  Increase the Bentyl to 3 times daily. 4)  We have given you samples of  Dexilant also. 5)  Copy sent to : Dr. Neena Rhymes 6)  The medication list was reviewed and reconciled.  All changed / newly prescribed medications were explained.  A complete medication list was provided to the patient / caregiver.

## 2010-04-23 NOTE — Progress Notes (Signed)
Summary: Woke up with indigestion  Phone Note Call from Patient Call back at [phone]   Call For: Mike Gip, PA Summary of Call: Woke up with a bad case of indigestion. Hurting all the way up in her chest. Doesnt even know what to eat today doesnt want to be hurting. Initial call taken by: Leanor Kail [facility],  April 18, 2010 8:39 AM  Follow-up for Phone Call        Message left for patient to call back. Jesse Fall, RN 04/18/10 10:55 AM Patient calling to report that this AM she had upper esophageal pain that was gas. She went to the bathroom and felt better. She has taken Tums today and is feeling better. She is asking if the rest of her results have come in. Please, advise  Follow-up by: Jesse Fall RN,  April 18, 2010 4:12 PM  Additional Follow-up for Phone Call Additional follow up Details #1::        STOOL STUDIES ALL NEGATIVE TO DATE,LABS ARE FINE. I THINK SHE HAS A VIRAL GASTROENTERITIS THAT IS LINGERING.  SHE DOES NEED A COLONOSCOPY FOR FOLLOW UP -WE CAN GET THAT SCHEDULED.... Additional Follow-up by: Peterson Ao,  April 19, 2010 9:16 AM    Additional Follow-up for Phone Call Additional follow up Details #2::    Spoke with patient and gave her the results from the stool studies as per Mike Gip, PA. Patient states she had a "letter" from [facility] that she does not pay out any for services but she wants to be sure that applies to a colonoscopy also. Patient given Billing Dept. [phone]) so she can find out how much she would have to pay. After she finds this out, she will call to schedule her colonoscopy. Follow-up by: Jesse Fall RN,  April 19, 2010 1:20 PM   Appended Document: Woke up with indigestion Patient called to schedule her colonoscopy. She would like a morning appointment and there are none avaialble at this time. She will call back on 04/29/10 to see if our schedules are out for May.

## 2010-04-23 NOTE — Telephone Encounter (Signed)
Spoke with pt and she states she has had quite a bit of diarrhea this morning. Pt wants to know if she can take Immodium. Pt informed it was fine for her to take the Immodium. She also wanted to know if she should continue taking the Align, pt instructed to continue the Align. Pt verbalized understanding.

## 2010-04-23 NOTE — Progress Notes (Signed)
Summary: Triage  Phone Note Call from Patient Call back at Home Phone [phone]   Caller: Patient Call For: Dr. Arlyce Dice Reason for Call: Talk to Nurse Summary of Call: nausea, diarrhea, soft loose bowels, gas, upper gastric pain x1 wk Initial call taken by: Karna Christmas,  April 15, 2010 8:15 AM  Follow-up for Phone Call        Patient states she has been having problems with gas and bloating for about a week. When she has gas she states she has to be on the comode because she is afraid she will have more than gas. States it has had some mucous in it also. Patient given an appointment to see Mike Gip, PA 04/15/10 @3pm . Patient aware of appointment date and time. Follow-up by: Selinda Michaels RN,  April 15, 2010 9:32 AM

## 2010-04-24 ENCOUNTER — Telehealth: Payer: Self-pay | Admitting: Gastroenterology

## 2010-04-24 NOTE — Telephone Encounter (Signed)
Spoke with patient and she states that sometimes when she has diarrhea there is something that looks like a "kernal of corn." Pt states that when she touches it, it is sticky. States that when she cuts it open it is white inside. States she doesn't have this with every diarrhea stool. Pt wants to know if her stool needs to be tested prior to her colon. Dr. Arlyce Dice please advise.

## 2010-04-26 ENCOUNTER — Ambulatory Visit (AMBULATORY_SURGERY_CENTER): Payer: Self-pay | Admitting: *Deleted

## 2010-04-26 VITALS — Ht 60.0 in | Wt 115.4 lb

## 2010-04-26 DIAGNOSIS — R197 Diarrhea, unspecified: Secondary | ICD-10-CM

## 2010-04-26 MED ORDER — PEG-KCL-NACL-NASULF-NA ASC-C 100 G PO SOLR: 1.0000 | Freq: Once | ORAL | Status: AC

## 2010-04-29 ENCOUNTER — Other Ambulatory Visit: Payer: Self-pay

## 2010-04-29 ENCOUNTER — Other Ambulatory Visit: Payer: Self-pay | Admitting: Family Medicine

## 2010-04-29 ENCOUNTER — Other Ambulatory Visit: Payer: Self-pay | Admitting: Gastroenterology

## 2010-04-29 DIAGNOSIS — R197 Diarrhea, unspecified: Secondary | ICD-10-CM

## 2010-04-29 NOTE — Telephone Encounter (Signed)
She should have stool o&p, c&s

## 2010-04-29 NOTE — Telephone Encounter (Signed)
Patient aware of Dr. Marzetta Board recommendations. She was instructed to come by the lab. Pt states she isn't having the symptoms anymore. Informed pt she could stop by to pick up supplies in case she has the symptoms again. Pt verbalized understanding.

## 2010-04-30 ENCOUNTER — Other Ambulatory Visit: Payer: Self-pay

## 2010-04-30 ENCOUNTER — Telehealth: Payer: Self-pay | Admitting: Gastroenterology

## 2010-04-30 DIAGNOSIS — R197 Diarrhea, unspecified: Secondary | ICD-10-CM

## 2010-04-30 NOTE — Telephone Encounter (Signed)
Patient wanted to know how long it would be before the stool samples she just dropped off would be back. Informed pt that I did not know , the lab could answer those questions. Pt wants to know if this will change her scheduled colon, told her that it would stay as scheduled unless she is notified otherwise.

## 2010-05-02 ENCOUNTER — Telehealth: Payer: Self-pay | Admitting: Gastroenterology

## 2010-05-02 NOTE — Progress Notes (Signed)
Summary: Serious abd pain  Phone Note Call from Patient Call back at [phone]   Call For: DR Arlyce Dice Summary of Call: Serious abd pain x2wks. Would like to speak to nurse. Initial call taken by: Leanor Kail [facility],  April 22, 2010 8:44 AM  Follow-up for Phone Call        Spoke with patient and she states that she is still having problems with gas, nausea, and indigestion. She is taking Dexilant, Align, and Bentyl. Patient saw Mike Gip, [place] 04/15/10, labs and stool tests were done and nothing was found. Dr. Arlyce Dice please advise. Follow-up by: Selinda Michaels RN,  April 22, 2010 10:40 AM  Additional Follow-up for Phone Call Additional follow up Details #1::        Change dexilant to protonix 40mg  once daily Change bentyl to hyomax 0.375mg  two times a day as needed abd pain. Is she scheduled for colo? Additional Follow-up by: Louis Meckel MD,  April 22, 2010 11:13 AM    Additional Follow-up for Phone Call Additional follow up Details #2::    Patient wants an AM appointment for colon. Is it ok to schedule her for one of your mornings when you are the hospital doc? Follow-up by: Selinda Michaels RN,  April 22, 2010 12:08 PM  Additional Follow-up for Phone Call Additional follow up Details #3:: Details for Additional Follow-up Action Taken: yes  Patient aware of Dr. Marzetta Board recommendations. Previsit scheduled for 04/26/10@2 :30pm, colon scheduled at [facility] for 05/06/10@9am . Patient aware of appointment date and time. Additional Follow-up by: Louis Meckel MD,  April 22, 2010 3:07 PM  New/Updated Medications: PROTONIX 40 MG SOLR (PANTOPRAZOLE SODIUM) Take 1 by mouth daily HYOMAX-DT 0.375 MG CR-TABS (HYOSCYAMINE SULFATE) Take 1 by mouth two times a day Prescriptions: HYOMAX-DT 0.375 MG CR-TABS (HYOSCYAMINE SULFATE) Take 1 by mouth two times a day  #60 x 1   Entered by:   Selinda Michaels RN   Authorized by:   Louis Meckel MD   Signed by:   Selinda Michaels RN on 04/22/2010   Method used:    Electronically to        [facility] [phone]* (retail)       [address]       [place], [place]  [address]       Ph: [phone]       Fax: [phone]   RxID:   [id number] PROTONIX 40 MG SOLR (PANTOPRAZOLE SODIUM) Take 1 by mouth daily  #30 x 1   Entered by:   Selinda Michaels RN   Authorized by:   Louis Meckel MD   Signed by:   Selinda Michaels RN on 04/22/2010   Method used:   Electronically to        [facility] [phone]* (retail)       [address]       [place], [place]  [address]       Ph: [phone]       Fax: [phone]   RxID:   [id number]

## 2010-05-02 NOTE — Telephone Encounter (Signed)
Informed pt that the results are not back yet.

## 2010-05-03 NOTE — Telephone Encounter (Signed)
Informed pt that the results are not back yet. Pt states she will talk to me next week.

## 2010-05-06 ENCOUNTER — Other Ambulatory Visit: Payer: Self-pay | Admitting: Gastroenterology

## 2010-05-06 ENCOUNTER — Ambulatory Visit (HOSPITAL_COMMUNITY)
Admission: RE | Admit: 2010-05-06 | Discharge: 2010-05-06 | Disposition: A | Discharge status: Home / Self Care | Payer: Self-pay | Source: Ambulatory Visit | Attending: Gastroenterology | Admitting: Gastroenterology

## 2010-05-06 ENCOUNTER — Encounter: Payer: Self-pay | Admitting: Gastroenterology

## 2010-05-06 DIAGNOSIS — R197 Diarrhea, unspecified: Secondary | ICD-10-CM

## 2010-05-06 DIAGNOSIS — Z79899 Other long term (current) drug therapy: Secondary | ICD-10-CM | POA: Insufficient documentation

## 2010-05-08 ENCOUNTER — Telehealth: Payer: Self-pay | Admitting: Gastroenterology

## 2010-05-08 ENCOUNTER — Encounter: Payer: Self-pay | Admitting: Gastroenterology

## 2010-05-08 NOTE — Telephone Encounter (Signed)
Pt wanted to make sure nothing was wrong because she got a letter giving her an appt to see Dr. Arlyce Dice. Explained to pt that Dr. Arlyce Dice routinely scheduled follow-up visits after procedure.

## 2010-05-17 ENCOUNTER — Telehealth: Payer: Self-pay | Admitting: Gastroenterology

## 2010-05-17 NOTE — Telephone Encounter (Signed)
Notified pt she was neg. For O&P and I don't know how to interpret the path from her COLON. I will leave it on Dr Marzetta Board desk to review and let her know something next week. Pt verbalized understanding. Dr Arlyce Dice, I placed the report in you IN BOX to review. Thanks.

## 2010-05-17 NOTE — Telephone Encounter (Signed)
Pt called for results of her O&P culture and the path report . I have called [facility] and they will fax the O&P report- rep stated there was no code to route the results back to.

## 2010-05-19 NOTE — Telephone Encounter (Signed)
Needs OV this week

## 2010-05-20 ENCOUNTER — Ambulatory Visit (INDEPENDENT_AMBULATORY_CARE_PROVIDER_SITE_OTHER): Payer: Self-pay | Admitting: Gastroenterology

## 2010-05-20 ENCOUNTER — Encounter: Payer: Self-pay | Admitting: Gastroenterology

## 2010-05-20 VITALS — BP 118/64 | HR 80 | Ht 60.0 in | Wt 115.0 lb

## 2010-05-20 DIAGNOSIS — R197 Diarrhea, unspecified: Secondary | ICD-10-CM

## 2010-05-20 NOTE — Telephone Encounter (Signed)
Notified pt Dr Arlyce Dice would like to see her this week and he has a cancellation today. Pt stated her diarrhea is gone, but she still has pain issues. Pt will come in at 2:45pm today.

## 2010-05-20 NOTE — Patient Instructions (Signed)
CC. Natalia Leatherwood Tabori,MD Follow up as needed

## 2010-05-20 NOTE — Assessment & Plan Note (Signed)
This is improving and was probably due to a self-limited infection. Lymphocytic colitis is less likely.  Recommendations #1 no further workup or therapy unless symptoms should recur or worsen, at which point I would consider treatment for lymphocytic colitis.

## 2010-05-20 NOTE — Progress Notes (Signed)
History of Present Illness:  Ms. Diekman has returned for followup of her diarrhea and abdominal pain. The former has significantly improved. Colonoscopy was normal. Random biopsies showed increased intraepithelial lymphocytes, which can be seen with acute self limiting colitis that is resolving, and with lymphocytic colitis.  She has occasional right upper quadrant pain for which she takes Bentyl with relief. Stool studies for O&P, C & S, C. dificile toxin were negative but fecal lactoferrin was positive.    Review of Systems: Pertinent positive and negative review of systems were noted in the above HPI section. All other review of systems were otherwise negative.    Current Medications, Allergies, Past Medical History, Past Surgical History, Family History and Social History were reviewed in [facility] electronic medical record  Vital signs were reviewed in today's medical record. Physical Exam: General: Well developed , well nourished, no acute distress

## 2010-05-23 ENCOUNTER — Encounter: Payer: Self-pay | Admitting: Gastroenterology

## 2010-05-28 ENCOUNTER — Encounter: Payer: Self-pay | Admitting: Family Medicine

## 2010-06-03 ENCOUNTER — Telehealth: Payer: Self-pay | Admitting: Gastroenterology

## 2010-06-03 NOTE — Telephone Encounter (Signed)
Pt states that Dr. Arlyce Dice told her to call and let us know if she started having problems again. Pt states that she is having problems with gas, bloating, diarrhea and nausea off and on. Pt states she has starting taking the probiotic again and is taking the Bentyl for discomfort. Pt wanted Dr. Arlyce Dice to know the symptoms that she is having again.

## 2010-06-04 ENCOUNTER — Other Ambulatory Visit (HOSPITAL_COMMUNITY)
Admission: RE | Admit: 2010-06-04 | Discharge: 2010-06-04 | Disposition: A | Discharge status: Home / Self Care | Payer: Self-pay | Source: Ambulatory Visit | Attending: Family Medicine | Admitting: Family Medicine

## 2010-06-04 ENCOUNTER — Ambulatory Visit (INDEPENDENT_AMBULATORY_CARE_PROVIDER_SITE_OTHER): Payer: Self-pay | Admitting: Family Medicine

## 2010-06-04 ENCOUNTER — Encounter: Payer: Self-pay | Admitting: Family Medicine

## 2010-06-04 VITALS — BP 122/62 | HR 74 | Temp 98.3°F | Wt 114.0 lb

## 2010-06-04 DIAGNOSIS — Z124 Encounter for screening for malignant neoplasm of cervix: Secondary | ICD-10-CM | POA: Insufficient documentation

## 2010-06-04 DIAGNOSIS — Z01419 Encounter for gynecological examination (general) (routine) without abnormal findings: Secondary | ICD-10-CM | POA: Insufficient documentation

## 2010-06-04 NOTE — Progress Notes (Signed)
  Subjective:    Patient ID: Regina Gonzalez, female    DOB: [birth year], 65 y.o.   MRN: [id number]  HPI GYN exam- has not had mammogram due to extensive GI issues/work-up.  Denies vaginal concerns- no d/c, bleeding, pain.  No family hx of GYN cancers.   Review of Systems For ROS see HPI     Objective:   Physical Exam  Genitourinary: Vagina normal and uterus normal. Rectal exam shows external hemorrhoid. No labial fusion. There is no rash or lesion on the right labia. There is no rash or lesion on the left labia. Uterus is not enlarged and not tender. Cervix exhibits no motion tenderness, no discharge and no friability. Right adnexum displays no mass, no tenderness and no fullness. Left adnexum displays no mass, no tenderness and no fullness. No tenderness around the vagina. No vaginal discharge found.          Assessment & Plan:

## 2010-06-04 NOTE — Patient Instructions (Signed)
Follow up in June or July to recheck cholesterol- do not eat before this appt Your exam looks great! Call with any questions or concerns Hang in there!

## 2010-06-04 NOTE — Assessment & Plan Note (Signed)
Pap collected.  Vaginal exam normal.

## 2010-06-04 NOTE — Telephone Encounter (Signed)
Begin peptobismol 2 tabs qid OV 2-3 weeks.  Call sooner if no better

## 2010-06-05 ENCOUNTER — Encounter: Payer: Self-pay | Admitting: *Deleted

## 2010-06-05 NOTE — Telephone Encounter (Signed)
Scheduled patient on 06/25/10 at 9:30 AM. Left message for patient to call me.

## 2010-06-05 NOTE — Telephone Encounter (Signed)
Patient given Dr. Marzetta Board recommendations and appointment. She will call back if no better.

## 2010-06-19 ENCOUNTER — Other Ambulatory Visit: Payer: Self-pay | Admitting: Family Medicine

## 2010-06-25 ENCOUNTER — Telehealth: Payer: Self-pay | Admitting: *Deleted

## 2010-06-25 ENCOUNTER — Ambulatory Visit: Payer: Self-pay | Admitting: Gastroenterology

## 2010-06-25 NOTE — Telephone Encounter (Signed)
Xanax Last filled 03-29-10 #30 1, last OV 06-04-10

## 2010-06-25 NOTE — Telephone Encounter (Signed)
THis is Dr Rennis Golden pt

## 2010-06-26 MED ORDER — ALPRAZOLAM 0.5 MG PO TABS: 0.5000 mg | ORAL_TABLET | Freq: Every evening | ORAL | Status: DC | PRN

## 2010-06-26 NOTE — Telephone Encounter (Signed)
Rx faxed

## 2010-06-26 NOTE — Telephone Encounter (Signed)
Ok for #30 w/ 1 refill

## 2010-07-09 ENCOUNTER — Telehealth: Payer: Self-pay | Admitting: *Deleted

## 2010-07-09 MED ORDER — ALPRAZOLAM 0.25 MG PO TABS: 0.2500 mg | ORAL_TABLET | ORAL | Status: AC | PRN

## 2010-07-09 NOTE — Telephone Encounter (Signed)
Ok for #30, no refills.  She can take 1/2 of what she has at home to finish them

## 2010-07-09 NOTE — Telephone Encounter (Signed)
Pt states that she would like to go back to the 0.25 mg tab of the xanax. .Please advise

## 2010-07-09 NOTE — Telephone Encounter (Signed)
Discuss with patient, Rx sent to pharmacy. 

## 2010-08-15 ENCOUNTER — Ambulatory Visit (INDEPENDENT_AMBULATORY_CARE_PROVIDER_SITE_OTHER): Payer: Self-pay | Admitting: Family Medicine

## 2010-08-15 DIAGNOSIS — F329 Major depressive disorder, single episode, unspecified: Secondary | ICD-10-CM

## 2010-08-15 MED ORDER — BUPROPION HCL ER (XL) 150 MG PO TB24: 150.0000 mg | ORAL_TABLET | ORAL | Status: DC

## 2010-08-15 NOTE — Patient Instructions (Signed)
Follow up in 1 month to recheck mood- sooner if you need me Start the Wellbutrin daily Continue the Effexor Use the Xanax as needed- that's what it's there for! Call the Crisis # if you have thoughts of hurting yourself Hang in there!

## 2010-08-15 NOTE — Progress Notes (Signed)
  Subjective:    Patient ID: Regina Gonzalez, female    DOB: 17-May-1945, 65 y.o.   MRN: [id number]  HPI Depression/anxiety- got in a fight w/ youngest daughter and now estranged.  Daughter is now keeping grandson from her.  Reports it's the 'worst thing i've ever been through in my whole life'.  Has not been eating well.  Is taking 'way too many xanax and i don't want to'.  When questioned she is taking 1/2 tab of 0.25mg  tab in the morning and 1 tab of 0.5mg  at night.  Has had suicidal thoughts in the past week.  Has difficult marriage.  Crying frequently.   Review of Systems For ROS see HPI     Objective:   Physical Exam  Constitutional: She appears well-developed and well-nourished.       tearful  Psychiatric:       Intermittently tearful.  Obviously upset.  Denies current SI/HI, able to contract for safety.          Assessment & Plan:

## 2010-08-16 ENCOUNTER — Encounter: Payer: Self-pay | Admitting: Family Medicine

## 2010-08-16 NOTE — Assessment & Plan Note (Signed)
Entirety of 34 minute visit spent listening to and counseling pt.  Will add Wellbutrin to current Effexor dose due to worsening situational depression.  Encouraged her to continue to use the xanax as needed- reassured her she is not taking too much.  Pt able to contract for safety.  #s of crisis center given and she was told to call if she had suicidal thoughts.  Pt expressed understanding and is in agreement w/ plan.

## 2010-08-26 ENCOUNTER — Other Ambulatory Visit: Payer: Self-pay | Admitting: Family Medicine

## 2010-08-26 NOTE — Telephone Encounter (Signed)
Refill sent, cpx due in dec. 

## 2010-08-27 ENCOUNTER — Other Ambulatory Visit: Payer: Self-pay | Admitting: Family Medicine

## 2010-08-27 MED ORDER — ALPRAZOLAM 0.5 MG PO TABS: 0.5000 mg | ORAL_TABLET | Freq: Every evening | ORAL | Status: AC | PRN

## 2010-08-27 NOTE — Telephone Encounter (Signed)
Last refilled 07/09/10. Please advise.

## 2010-08-27 NOTE — Telephone Encounter (Signed)
Ok for #30, 1 refill 

## 2010-08-27 NOTE — Telephone Encounter (Signed)
Refill done.  

## 2010-08-30 ENCOUNTER — Ambulatory Visit (INDEPENDENT_AMBULATORY_CARE_PROVIDER_SITE_OTHER): Payer: Self-pay | Admitting: Family Medicine

## 2010-08-30 ENCOUNTER — Encounter: Payer: Self-pay | Admitting: Family Medicine

## 2010-08-30 DIAGNOSIS — R0602 Shortness of breath: Secondary | ICD-10-CM

## 2010-08-30 DIAGNOSIS — L819 Disorder of pigmentation, unspecified: Secondary | ICD-10-CM

## 2010-08-30 DIAGNOSIS — F329 Major depressive disorder, single episode, unspecified: Secondary | ICD-10-CM

## 2010-08-30 NOTE — Patient Instructions (Signed)
If your shortness of breath worsens- please call or go to the ER I think it is related to your anxiety.  Continue your current meds Make sure you keep breathing- especially when talking about stressful topics Use your inhaler if you feel you can't catch your breath Call with any questions or concerns Hang in there!

## 2010-08-30 NOTE — Progress Notes (Signed)
  Subjective:    Patient ID: Regina Gonzalez, female    DOB: 11/01/1945, 65 y.o.   MRN: [id number]  HPI SOB- walks regularly, will occasionally jog w/out difficulty but around the house, at 'random times' will feel short of breath.  Feels sxs have increased since her stress has increased.  No difficulty w/ stairs.  Admits that her shortness of breath may come when she is talking about emotionally stressful situations.  Depression- feels her sxs are improving since adding the Wellbutrin.  Still intermittently tearful but denies SI/HI.  Skin pigment changes- white spots on arms and legs after going to the beach.  No itching.   Review of Systems For ROS see HPI     Objective:   Physical Exam  Constitutional: She is oriented to person, place, and time. She appears well-developed and well-nourished. No distress.  HENT:  Head: Normocephalic and atraumatic.  Eyes: Conjunctivae and EOM are normal. Pupils are equal, round, and reactive to light.  Neck: Normal range of motion. Neck supple. No thyromegaly present.  Cardiovascular: Normal rate, regular rhythm, normal heart sounds and intact distal pulses.   No murmur heard. Pulmonary/Chest: Effort normal and breath sounds normal. No respiratory distress.  Abdominal: Soft. She exhibits no distension. There is no tenderness.  Musculoskeletal: She exhibits no edema.  Lymphadenopathy:    She has no cervical adenopathy.  Neurological: She is alert and oriented to person, place, and time.  Skin: Skin is warm and dry.       Multiple small, scattered areas of hypopigmentation of arms and legs.  None present on trunk.  Psychiatric: She has a normal mood and affect. Her behavior is normal.          Assessment & Plan:   No problem-specific assessment & plan notes found for this encounter.

## 2010-09-01 DIAGNOSIS — L819 Disorder of pigmentation, unspecified: Secondary | ICD-10-CM | POA: Insufficient documentation

## 2010-09-01 NOTE — Assessment & Plan Note (Signed)
Pt feels sxs have improved since starting Wellbutrin.  No longer having SI.  Will continue to follow closely.

## 2010-09-01 NOTE — Assessment & Plan Note (Signed)
Pt is able to walk, jog, and do stairs w/out difficulty.  This makes cardiac or pulmonary etiologies very unlikely.  Most likely this is due to her increased anxiety.  Pt encouraged to take deep breaths and pay attention to the circumstances surrounding her 'random' occurences.  Will follow.

## 2010-09-01 NOTE — Assessment & Plan Note (Signed)
Multiple small areas on arms and legs.  None on trunk so not exactly consistent w/ tinea versicolor.  Reassurance provided- no cause for concern.  Will follow.

## 2010-09-02 ENCOUNTER — Encounter: Payer: Self-pay | Admitting: Family Medicine

## 2010-09-09 ENCOUNTER — Telehealth: Payer: Self-pay

## 2010-09-09 NOTE — Telephone Encounter (Signed)
Call from patient stating she was taking Wellbutrin and it is not working for her and she wants to be weaned off. Please advise    KP

## 2010-09-09 NOTE — Telephone Encounter (Signed)
At pt's recent visit on 7/27 she reported that she was doing well on the Wellbutrin.  Unclear why she wants to stop med.  Will need new script for 75mg  daily as her pills are extended release and cannot be broken.

## 2010-09-10 NOTE — Telephone Encounter (Signed)
Spoke with patient and  She stated she changed her mind, she stated she would continue the Wellbutrin for now and if she changes her mind again she would let us know.Marland Kitchen    KP

## 2010-09-13 ENCOUNTER — Ambulatory Visit: Payer: Self-pay | Admitting: Family Medicine

## 2010-09-17 ENCOUNTER — Telehealth: Payer: Self-pay | Admitting: Family Medicine

## 2010-09-17 ENCOUNTER — Ambulatory Visit: Payer: Self-pay | Admitting: Family Medicine

## 2010-09-17 NOTE — Telephone Encounter (Signed)
Should schedule fasting lab appt for lipids and hepatic panel (liver function)- dx 272.4

## 2010-09-17 NOTE — Telephone Encounter (Signed)
Made lab appt for Thursday 8/16 at 8:30

## 2010-09-18 ENCOUNTER — Other Ambulatory Visit: Payer: Self-pay | Admitting: Family Medicine

## 2010-09-18 DIAGNOSIS — E785 Hyperlipidemia, unspecified: Secondary | ICD-10-CM

## 2010-09-19 ENCOUNTER — Other Ambulatory Visit (INDEPENDENT_AMBULATORY_CARE_PROVIDER_SITE_OTHER): Payer: Self-pay

## 2010-09-19 ENCOUNTER — Telehealth: Payer: Self-pay

## 2010-09-19 DIAGNOSIS — E785 Hyperlipidemia, unspecified: Secondary | ICD-10-CM

## 2010-09-19 LAB — LIPID PANEL
Cholesterol: 184 mg/dL (ref 0–200)
HDL: 84.8 mg/dL (ref >39.00)
LDL Cholesterol: 88 mg/dL (ref 0–99)
Total CHOL/HDL Ratio: 2
Triglycerides: 57 mg/dL (ref 0.0–149.0)
VLDL: 11.4 mg/dL (ref 0.0–40.0)

## 2010-09-19 LAB — HEPATIC FUNCTION PANEL
ALT: 19 U/L (ref 0–35)
AST: 21 U/L (ref 0–37)
Albumin: 4.3 g/dL (ref 3.5–5.2)
Alkaline Phosphatase: 54 U/L (ref 39–117)
Bilirubin, Direct: 0 mg/dL (ref 0.0–0.3)
Total Bilirubin: 0.4 mg/dL (ref 0.3–1.2)
Total Protein: 6.8 g/dL (ref 6.0–8.3)

## 2010-09-19 NOTE — Telephone Encounter (Signed)
Message copied by Beverely Low on Thu Sep 19, 2010  4:23 PM ------      Message from: Sheliah Hatch      Created: Thu Sep 19, 2010  4:12 PM       Labs look great!  Keep up the good work!

## 2010-09-19 NOTE — Progress Notes (Signed)
Labs only

## 2010-09-19 NOTE — Telephone Encounter (Signed)
Labs mailed

## 2010-09-23 ENCOUNTER — Telehealth: Payer: Self-pay | Admitting: *Deleted

## 2010-09-23 MED ORDER — BUPROPION HCL 75 MG PO TABS: 75.0000 mg | ORAL_TABLET | Freq: Two times a day (BID) | ORAL | Status: DC

## 2010-09-23 NOTE — Telephone Encounter (Signed)
Discuss with patient  

## 2010-09-23 NOTE — Telephone Encounter (Signed)
Left message to call office

## 2010-09-23 NOTE — Telephone Encounter (Signed)
Ok to decrease meds if pt feels she is ready to step down.  Ok to fill for 6 months.  The 75mg  tabs are twice a day- #60

## 2010-09-23 NOTE — Telephone Encounter (Signed)
Pt left VM that she would like to change from Wellbutrin 150 mg to 75 mg. Please advise

## 2010-10-14 ENCOUNTER — Encounter: Payer: Self-pay | Admitting: Family Medicine

## 2010-10-14 ENCOUNTER — Ambulatory Visit (INDEPENDENT_AMBULATORY_CARE_PROVIDER_SITE_OTHER): Payer: Self-pay | Admitting: Family Medicine

## 2010-10-14 VITALS — BP 130/64 | Temp 97.8°F | Wt 113.0 lb

## 2010-10-14 DIAGNOSIS — L678 Other hair color and hair shaft abnormalities: Secondary | ICD-10-CM

## 2010-10-14 DIAGNOSIS — L739 Follicular disorder, unspecified: Secondary | ICD-10-CM | POA: Insufficient documentation

## 2010-10-14 DIAGNOSIS — L738 Other specified follicular disorders: Secondary | ICD-10-CM

## 2010-10-14 MED ORDER — CEPHALEXIN 500 MG PO CAPS: 500.0000 mg | ORAL_CAPSULE | Freq: Two times a day (BID) | ORAL | Status: DC

## 2010-10-14 NOTE — Progress Notes (Signed)
  Subjective:    Patient ID: Regina Gonzalez, female    DOB: 1945/12/31, 65 y.o.   MRN: [id number]  HPI R nasal pain- started 'a couple of days ago'.  Pain is worsening.  Last night was unable to sleep due to pain.  Has redness, swelling of R nostril and face.  Has scab in nose and has been using H2O2 and neosporin w/out relief.  Has hx of cold sores.   Review of Systems For ROS see HPI     Objective:   Physical Exam  Vitals reviewed. Constitutional: She appears well-developed and well-nourished. No distress.  HENT:  Head: Normocephalic and atraumatic.  Nose: Nose lacerations (small scab just inside R nostril on lateral wall) and sinus tenderness (over R nostril and R paranasal area, + erythema and swelling) present.            Assessment & Plan:

## 2010-10-14 NOTE — Patient Instructions (Signed)
This appears to be infection Start the keflex for the infection ICE- 15 minutes out of every hour whenever you think of it Apply neosporin or vasoline w/ a qtip on the scab in the nostril Call with any questions or concerns Hang in there!

## 2010-10-15 ENCOUNTER — Telehealth: Payer: Self-pay | Admitting: *Deleted

## 2010-10-15 ENCOUNTER — Other Ambulatory Visit: Payer: Self-pay | Admitting: Family Medicine

## 2010-10-15 NOTE — Telephone Encounter (Signed)
Discuss with patient appt scheduled. 

## 2010-10-15 NOTE — Telephone Encounter (Signed)
Pt states that after speaking with pharmacy she found that neosporin is not suppose to be put in your nose. Pt notes that she had be doing this for her present infection and the last time she had one as well. Pt just wanted to may Dr Beverely Low aware because pharmacist advise her to contact her PCP.

## 2010-10-15 NOTE — Telephone Encounter (Signed)
Pt called back stating that she has been experiencing some burning in nostril, ear and throat all on side with the infection. Pt would like to know if this is normal.

## 2010-10-15 NOTE — Telephone Encounter (Signed)
No-- pt should probably make appointment

## 2010-10-16 ENCOUNTER — Ambulatory Visit (INDEPENDENT_AMBULATORY_CARE_PROVIDER_SITE_OTHER): Payer: Self-pay | Admitting: Family Medicine

## 2010-10-16 ENCOUNTER — Encounter: Payer: Self-pay | Admitting: Family Medicine

## 2010-10-16 VITALS — BP 130/80 | HR 80 | Temp 98.7°F | Wt 115.0 lb

## 2010-10-16 DIAGNOSIS — L0291 Cutaneous abscess, unspecified: Secondary | ICD-10-CM

## 2010-10-16 DIAGNOSIS — L0390 Cellulitis, unspecified: Secondary | ICD-10-CM

## 2010-10-16 MED ORDER — MUPIROCIN 2 % EX OINT: TOPICAL_OINTMENT | CUTANEOUS | Status: AC

## 2010-10-16 MED ORDER — MUPIROCIN CALCIUM 2 % NA OINT: TOPICAL_OINTMENT | Freq: Two times a day (BID) | NASAL | Status: DC

## 2010-10-16 MED ORDER — DOXYCYCLINE HYCLATE 100 MG PO TABS: 100.0000 mg | ORAL_TABLET | Freq: Two times a day (BID) | ORAL | Status: AC

## 2010-10-16 NOTE — Telephone Encounter (Signed)
Cellulitis would not cause burning pt is experiencing- may have shingles.  Needs OV

## 2010-10-16 NOTE — Patient Instructions (Signed)
Cellulitis Cellulitis is an infection of the skin and the tissue beneath it. The area is typically red and tender. It is caused by germs (bacteria) (usually staph or strep) that enter the body through cuts or sores. Cellulitis most commonly occurs in the arms or lower legs.  HOME CARE INSTRUCTIONS  If you are given a prescription for medications which kill germs (antibiotics), take as directed until finished.   If the infection is on the arm or leg, keep the limb elevated as able.   Use a warm cloth several times per day to relieve pain and encourage healing.   See your caregiver for recheck of the infected site in 2-3days or sooner if problems arise.   Only take over-the-counter or prescription medicines for pain, discomfort, or fever as directed by your caregiver.  SEEK MEDICAL CARE IF:  An oral temperature above 100.4 develops, not controlled by medication.   The area of redness (inflammation) is spreading, there are red streaks coming from the infected site, or if a part of the infection begins to turn dark in color.   The joint or bone underneath the infected skin becomes painful after the skin has healed.   The infection returns in the same or another area after it seems to have gone away.   A boil or bump swells up. This may be an abscess.   New, unexplained problems such as pain or fever develop.  SEEK IMMEDIATE MEDICAL CARE IF:  You or your child feels drowsy or lethargic.   There is vomiting, diarrhea, or lasting discomfort or feeling ill (malaise) with muscle aches and pains.  MAKE SURE YOU:   Understand these instructions.   Will watch your condition.   Will get help right away if you are not doing well or get worse.  Document Released: 10/30/2004 Document Re-Released: 11/17/2008 [facility] Patient Information 2011 [place], [place].

## 2010-10-16 NOTE — Progress Notes (Signed)
  Subjective:    Patient ID: Regina Gonzalez, female    DOB: August 25, 1945, 65 y.o.   MRN: [id number]  HPI  Pt here f/u cellulitis nose.  Pt state she is taking keflex bid and she feels like it is worse--she c/o pressure in the face.  Pt is very concerned about the cost of anything done because she has not insurance. Review of Systems    as above Objective:   Physical Exam  Constitutional: She appears well-developed and well-nourished.  HENT:  Mouth/Throat: Oropharynx is clear and moist. No oropharyngeal exudate.       + errythema R nostril with ? Abscess inside---not draining + tenderness R sinuses   Eyes: Conjunctivae are normal. Right eye exhibits no discharge. Left eye exhibits no discharge.  Neck: Normal range of motion. Neck supple.  Psychiatric: She has a normal mood and affect. Her behavior is normal.          Assessment & Plan:  Nasal cellulitis with ? Abscess----change abx to doxycycline and use bactroban in nose                                           May need CT---ENT referrral done for Friday                                             rto if symptoms worsen or go to ER

## 2010-10-18 ENCOUNTER — Telehealth: Payer: Self-pay | Admitting: *Deleted

## 2010-10-18 NOTE — Telephone Encounter (Signed)
Noted! Thank you

## 2010-10-18 NOTE — Telephone Encounter (Signed)
Pt called to report that she saw the ENT on yesterday and was Dx with MRSA cellulitis.

## 2010-10-20 NOTE — Assessment & Plan Note (Signed)
Pt was small scab present in R nostril w/ surrounding erythema, edema, and tenderness.  Start abx to tx cellulitis.  No evidence of deeper sinus infxn.  Reviewed supportive care and red flags that should prompt return.  Pt expressed understanding and is in agreement w/ plan.

## 2010-10-21 ENCOUNTER — Telehealth: Payer: Self-pay | Admitting: *Deleted

## 2010-10-21 NOTE — Telephone Encounter (Signed)
Pt called to report that she is current taking the Wellbutrin but was suppose to change to the Effexor but it is too expensive at $70 a month. Pt would like to know if there is a cheaper med that can be Rx or if it is ok to stay on Wellbutrin. Please advise

## 2010-10-21 NOTE — Telephone Encounter (Signed)
Discuss with patient  

## 2010-10-21 NOTE — Telephone Encounter (Signed)
effexor (venlafaxine) is generic and should not cost that much.  But if it does, she can continue the Wellbutrin.

## 2010-10-22 ENCOUNTER — Other Ambulatory Visit: Payer: Self-pay | Admitting: Family Medicine

## 2010-10-22 MED ORDER — ALPRAZOLAM 0.5 MG PO TABS: 0.5000 mg | ORAL_TABLET | Freq: Three times a day (TID) | ORAL | Status: DC | PRN

## 2010-10-22 NOTE — Telephone Encounter (Signed)
Done

## 2010-10-22 NOTE — Telephone Encounter (Signed)
Ok for #30, 1 refill 

## 2010-10-22 NOTE — Telephone Encounter (Signed)
Last OV 10/16/10. Last rx written 08/27/10

## 2010-10-25 ENCOUNTER — Encounter: Payer: Self-pay | Admitting: Family Medicine

## 2010-10-25 ENCOUNTER — Ambulatory Visit (INDEPENDENT_AMBULATORY_CARE_PROVIDER_SITE_OTHER): Payer: Self-pay | Admitting: Family Medicine

## 2010-10-25 DIAGNOSIS — L678 Other hair color and hair shaft abnormalities: Secondary | ICD-10-CM

## 2010-10-25 DIAGNOSIS — L739 Follicular disorder, unspecified: Secondary | ICD-10-CM

## 2010-10-25 DIAGNOSIS — F329 Major depressive disorder, single episode, unspecified: Secondary | ICD-10-CM

## 2010-10-25 DIAGNOSIS — L738 Other specified follicular disorders: Secondary | ICD-10-CM

## 2010-10-25 DIAGNOSIS — F3289 Other specified depressive episodes: Secondary | ICD-10-CM

## 2010-10-25 NOTE — Progress Notes (Signed)
  Subjective:    Patient ID: Regina Gonzalez, female    DOB: 09-Jul-1945, 65 y.o.   MRN: [id number]  HPI 'i'm just not getting better'.  Still taking Doxy- on day 10 or 11 of 14.  'it feels like i have the flu'.  Feeling winded when walking up the stairs, needs to rest w/ prolonged standing, increased fatigue.  + frontal HA 'a few days ago'.  Admits to be physical and emotional 'exhaustion'.  Mom is now in hospice and 'not doing well'.  'i just don't know if this is the way i'm supposed to feel'.   Review of Systems For ROS see HPI     Objective:   Physical Exam  Vitals reviewed. Constitutional: She appears well-developed and well-nourished. No distress.  HENT:  Head: Normocephalic and atraumatic.  Nose: Nose normal.  Mouth/Throat: Oropharynx is clear and moist. No oropharyngeal exudate.       Small scab on L cheek w/out surrounding erythema, edema, or induration Nasal redness and lesions have completely resolved.  Eyes: Conjunctivae and EOM are normal. Pupils are equal, round, and reactive to light.  Neck: Normal range of motion. Neck supple.  Cardiovascular: Normal rate, regular rhythm and normal heart sounds.   Pulmonary/Chest: Effort normal and breath sounds normal. No respiratory distress. She has no wheezes.  Lymphadenopathy:    She has no cervical adenopathy.  Psychiatric:       Holding back tears when talking about mom          Assessment & Plan:

## 2010-10-25 NOTE — Patient Instructions (Signed)
HANG IN THERE!  You're doing great! Listen to your body- rest if you need to Give yourself a break!

## 2010-10-27 NOTE — Assessment & Plan Note (Signed)
Pt is trying to hold it together but isn't realizing the physical toll that her mom's poor health and nearing death is taking on her.  Encouraged her to acknowledge these feelings and address them if needed.  Will follow closely.

## 2010-10-27 NOTE — Assessment & Plan Note (Signed)
Resolved.  No evidence of current infxn.  Encouraged her to finish remaining abx as directed.

## 2010-10-28 ENCOUNTER — Other Ambulatory Visit: Payer: Self-pay | Admitting: Family Medicine

## 2010-10-28 ENCOUNTER — Telehealth: Payer: Self-pay

## 2010-10-28 MED ORDER — CITALOPRAM HYDROBROMIDE 20 MG PO TABS: 20.0000 mg | ORAL_TABLET | Freq: Every day | ORAL | Status: DC

## 2010-10-28 NOTE — Telephone Encounter (Signed)
Pt called and states she was in last week to see Dr. Beverely Low and was taken off Effexor and put on Wellbutrin.  Pt states she does not believe this mediation is enough for her.  Pt would like to know if she can get another affordable medication. Pls advise.

## 2010-10-28 NOTE — Telephone Encounter (Signed)
Should start Celexa 20mg  daily- available for $4 at [facility] w/ or w/out insurance

## 2010-10-28 NOTE — Telephone Encounter (Signed)
Rx called in to pharmacy for celexa per Dr. Beverely Low

## 2010-10-30 NOTE — Telephone Encounter (Signed)
This Dr.Tabori's patient-- Please advise   KP

## 2010-10-31 ENCOUNTER — Ambulatory Visit (INDEPENDENT_AMBULATORY_CARE_PROVIDER_SITE_OTHER): Payer: Self-pay | Admitting: Family Medicine

## 2010-10-31 ENCOUNTER — Encounter: Payer: Self-pay | Admitting: Family Medicine

## 2010-10-31 VITALS — BP 110/64 | Temp 97.6°F | Wt 111.0 lb

## 2010-10-31 DIAGNOSIS — R350 Frequency of micturition: Secondary | ICD-10-CM | POA: Insufficient documentation

## 2010-10-31 LAB — POCT URINALYSIS DIPSTICK
Bilirubin, UA: NEGATIVE
Glucose, UA: NEGATIVE
Ketones, UA: NEGATIVE
Nitrite, UA: NEGATIVE
Protein, UA: NEGATIVE
Spec Grav, UA: 1.005
Urobilinogen, UA: NEGATIVE
pH, UA: 6

## 2010-10-31 MED ORDER — CIPROFLOXACIN HCL 500 MG PO TABS: 500.0000 mg | ORAL_TABLET | Freq: Two times a day (BID) | ORAL | Status: AC

## 2010-10-31 NOTE — Progress Notes (Signed)
  Subjective:    Patient ID: Regina Gonzalez, female    DOB: [birth year], 65 y.o.   MRN: [id number]  HPI sxs started 4 days ago w/ dizziness, HA, nausea.  Denies sinus pressure, vertigo.  No vomiting.  No fevers.  + constipation due to decreased eating.  + body aches.  L flank pain, + urinary frequently and decreased flow, some hesitancy.   Review of Systems For ROS see HPI     Objective:   Physical Exam  Vitals reviewed. Constitutional: She appears well-developed and well-nourished. No distress.  HENT:  Head: Normocephalic and atraumatic.  Nose: Nose normal.  Mouth/Throat: Oropharynx is clear and moist. No oropharyngeal exudate.  Eyes: Conjunctivae and EOM are normal. Pupils are equal, round, and reactive to light.  Neck: Normal range of motion. Neck supple.  Cardiovascular: Normal rate, regular rhythm and normal heart sounds.   Pulmonary/Chest: Effort normal and breath sounds normal. No respiratory distress. She has no wheezes. She has no rales.  Abdominal: Soft. Bowel sounds are normal. She exhibits no distension. There is tenderness (suprapubic tenderness and TTP over L flank).  Lymphadenopathy:    She has no cervical adenopathy.  Skin: Skin is warm and dry.          Assessment & Plan:

## 2010-10-31 NOTE — Assessment & Plan Note (Signed)
Pt's sxs and UA consistent w/ infxn.  Start Cipro due to flank pain.  Encouraged increased fluids.  Reviewed supportive care and red flags that should prompt return.  Pt expressed understanding and is in agreement w/ plan.

## 2010-10-31 NOTE — Patient Instructions (Signed)
You have a bladder infection Take the Cipro as directed Drink plenty of fluids Hang in there!!!

## 2010-11-01 ENCOUNTER — Telehealth: Payer: Self-pay | Admitting: *Deleted

## 2010-11-01 MED ORDER — SULFAMETHOXAZOLE-TRIMETHOPRIM 800-160 MG PO TABS: 1.0000 | ORAL_TABLET | Freq: Two times a day (BID) | ORAL | Status: AC

## 2010-11-01 NOTE — Telephone Encounter (Signed)
Pt states that she is taking meds with food and plenty of fluids. Pt would like to switch to a different abx

## 2010-11-01 NOTE — Telephone Encounter (Signed)
Pt call to report increased dizziness since starting med. Pt would like to know if maybe med should be changed. Please advise

## 2010-11-01 NOTE — Telephone Encounter (Signed)
Switch to Bactrim DS bid x7 days, #14, no refills

## 2010-11-01 NOTE — Telephone Encounter (Signed)
Done

## 2010-11-01 NOTE — Telephone Encounter (Signed)
Please make sure she is taking this with food and drinking plenty of fluids

## 2010-11-02 LAB — URINE CULTURE
Colony Count: NO GROWTH
Organism ID, Bacteria: NO GROWTH

## 2010-11-04 ENCOUNTER — Telehealth: Payer: Self-pay

## 2010-11-04 NOTE — Telephone Encounter (Signed)
Pt's fatigue is a combo of recent illness/infection and also her ongoing emotional situation (mother in hospice, daughter is estranged, not seeing grandson).  There is no time table for when she will feel better but with all that she has going on it is no wonder she is exhausted.  This is normal and expected.  The antibiotics are usually out of the system in 24-48 hrs.

## 2010-11-04 NOTE — Telephone Encounter (Signed)
Pt states she is still feeling tired. Pt states she was told that with her having MRSA this is common because it lies dormant. Pt would like to know how long it should be before she gets her energy level back up.  Pt would like to know how long it will take for the antibiotic to be out of her system. Pls advise.

## 2010-11-04 NOTE — Telephone Encounter (Signed)
Pt awarea

## 2010-11-04 NOTE — Progress Notes (Signed)
Quick Note:  Pt aware ______ 

## 2010-11-07 ENCOUNTER — Telehealth: Payer: Self-pay | Admitting: *Deleted

## 2010-11-07 NOTE — Telephone Encounter (Signed)
Discuss with patient  

## 2010-11-07 NOTE — Telephone Encounter (Signed)
Pt called to report that she has been taking Wellbutrin 150 mg daily and 75 mg bid along with celexa 20 mg qd.  Pt was advise today that she should be taking Wellbutrin 75 mg bid and celexa 20 mg. Please advise how Pt should now transition to take Wellbutrin from now on. Pt notes that she has already taking 150 mg today.

## 2010-11-07 NOTE — Telephone Encounter (Signed)
She should be taking either the 150mg  extended release OR the 75mg  twice daily but not both.  Since she has been taking higher doses of meds she should take the 150mg  daily x2 weeks and then decrease down to 75mg  twice daily (if she feels she's ready to wean down- it may not be the right time w/ mom's illness)

## 2010-11-12 ENCOUNTER — Other Ambulatory Visit: Payer: Self-pay | Admitting: Family Medicine

## 2010-11-25 ENCOUNTER — Other Ambulatory Visit: Payer: Self-pay | Admitting: Family Medicine

## 2010-11-25 NOTE — Telephone Encounter (Signed)
Last OV 10-31-10 last refill 10-22-10

## 2010-12-11 ENCOUNTER — Other Ambulatory Visit: Payer: Self-pay | Admitting: Family Medicine

## 2010-12-11 NOTE — Telephone Encounter (Signed)
Manually faxed signed xanax rx to [facility] on [address]

## 2010-12-11 NOTE — Telephone Encounter (Signed)
Last OV 10-31-10 Last refil 11-25-10

## 2010-12-18 ENCOUNTER — Encounter: Payer: Self-pay | Admitting: Family Medicine

## 2010-12-18 ENCOUNTER — Ambulatory Visit (INDEPENDENT_AMBULATORY_CARE_PROVIDER_SITE_OTHER): Payer: Self-pay | Admitting: Family Medicine

## 2010-12-18 VITALS — BP 115/70 | HR 80 | Temp 97.7°F | Ht 62.0 in | Wt 113.4 lb

## 2010-12-18 DIAGNOSIS — R300 Dysuria: Secondary | ICD-10-CM

## 2010-12-18 LAB — POCT URINALYSIS DIPSTICK
Bilirubin, UA: NEGATIVE
Glucose, UA: NEGATIVE
Ketones, UA: NEGATIVE
Nitrite, UA: NEGATIVE
Protein, UA: NEGATIVE
Urobilinogen, UA: 0.2
pH, UA: 6.5

## 2010-12-18 MED ORDER — CEPHALEXIN 500 MG PO CAPS: 500.0000 mg | ORAL_CAPSULE | Freq: Two times a day (BID) | ORAL | Status: AC

## 2010-12-18 NOTE — Progress Notes (Signed)
Addended by: Derry Lory A on: 12/18/2010 11:07 AM   Modules accepted: Orders

## 2010-12-18 NOTE — Patient Instructions (Signed)
This is a bladder infection Take the keflex as directed Drink plenty of fluids Hang in there!! You're in my thoughts and prayers!

## 2010-12-18 NOTE — Assessment & Plan Note (Signed)
Pt's sxs and UA consistent w/ infxn.  Start abx.  Reviewed supportive care and red flags that should prompt return.  Pt expressed understanding and is in agreement w/ plan.  

## 2010-12-18 NOTE — Progress Notes (Signed)
  Subjective:    Patient ID: Regina Gonzalez, female    DOB: November 17, 1945, 65 y.o.   MRN: [id number]  HPI UTI- sxs started 3 days ago.  + dysuria, frequency.  Hx of similar.  No suprapubic or CVA tenderness.   Review of Systems For ROS see HPI     Objective:   Physical Exam  Vitals reviewed. Constitutional: She appears well-developed and well-nourished. No distress.  Abdominal: Soft. She exhibits no distension. There is no tenderness (no suprapubic or CVA tenderness).          Assessment & Plan:

## 2010-12-20 LAB — URINE CULTURE: Colony Count: >=100000

## 2010-12-20 MED ORDER — NITROFURANTOIN MONOHYD MACRO 100 MG PO CAPS: 100.0000 mg | ORAL_CAPSULE | Freq: Two times a day (BID) | ORAL | Status: AC

## 2010-12-20 NOTE — Progress Notes (Signed)
Addended by: Derry Lory A on: 12/20/2010 05:04 PM   Modules accepted: Orders

## 2010-12-31 ENCOUNTER — Other Ambulatory Visit: Payer: Self-pay | Admitting: Family Medicine

## 2010-12-31 NOTE — Telephone Encounter (Signed)
Manually faxed refill for #60 1tab PO TID/PRN per anxiety to [facility]

## 2011-01-01 MED ORDER — ALPRAZOLAM 0.5 MG PO TABS: 0.5000 mg | ORAL_TABLET | Freq: Three times a day (TID) | ORAL | Status: DC | PRN

## 2011-01-01 NOTE — Telephone Encounter (Signed)
Addended by: Candie Echevaria L on: 01/01/2011 02:57 PM   Modules accepted: Orders

## 2011-01-01 NOTE — Telephone Encounter (Signed)
Pt aware Rx faxed to pharamacy

## 2011-01-08 ENCOUNTER — Ambulatory Visit (INDEPENDENT_AMBULATORY_CARE_PROVIDER_SITE_OTHER): Payer: Self-pay

## 2011-01-08 DIAGNOSIS — Z23 Encounter for immunization: Secondary | ICD-10-CM

## 2011-01-10 ENCOUNTER — Telehealth: Payer: Self-pay | Admitting: *Deleted

## 2011-01-10 NOTE — Telephone Encounter (Signed)
Called pt to advise that she can: Can drop off urine for dip and cx (if abnormal) Noted she had left work, called mobile number in chart, spoke to pt and she stated that she is feeling better and will monitor how she feels before she brings sample in to be checked.

## 2011-01-10 NOTE — Telephone Encounter (Signed)
Given mom's recent passing this may not be the best time to come off meds but if she thinks she is ready she can stop it and see how she does on the Celexa.  Will need appt in 1 month.  Please give her our condolences

## 2011-01-10 NOTE — Telephone Encounter (Signed)
Called pt to advise: Given mom's recent passing this may not be the best time to come off meds but if she thinks she is ready she can stop it and see how she does on the Celexa. Will need appt in 1 month. Please give her our condolences She stated she will finish the rest of her bottle Pt wanted to let MD know that she has not gotten finacial aid this time and may not be able to come to office as much as she has been She stated that she is still burning and not feeling the best and wondered if she needed another rx for this issue and per the financial issue can  She just come in to get the urine culture instead of an office visit per the cost of a visit, she has been set up on a payment plan with [facility] this am

## 2011-01-10 NOTE — Telephone Encounter (Signed)
Pt left vm to see if she can come off the bupropion and just take the celexa?

## 2011-01-10 NOTE — Telephone Encounter (Signed)
Can drop off urine for dip and cx (if abnormal)

## 2011-01-17 ENCOUNTER — Telehealth: Payer: Self-pay | Admitting: *Deleted

## 2011-01-17 NOTE — Telephone Encounter (Signed)
.  left message to have patient return my call.  

## 2011-01-17 NOTE — Telephone Encounter (Signed)
Called pt to advise MD tabori, reviewed the radiology information and noted for pt to have a CT chest w/contrast when she has insurance again, pt advised that she is not sure when she will have insurance again but wants to see if we can send her somewhere that will allow her to make payments for the procedure if possible, pt asked if there is anything else she can do for her reflux? She stated that she did forget to take the prilosec OTC for a couple of days and it worsened but also noted constipation so she has been using stool softner/increasing fiber intake and hydrating well, pt stated that she may feel that she just needs to get cleaned out before her reflux will resolve she also noted at one time that she was getting samples to help with reflux but currently can not afford another rx , pt asked about the cysts noted on her liver per was advised that people can have these and this can be normal, this nurse advised to pt that she will need to come in for an office visit to discuss all of these issues, pt understood, placed radiology report at front desk for pt pick up-pt aware

## 2011-01-17 NOTE — Telephone Encounter (Signed)
We can refer her for the CT scan but she would have to work out the payment info w/ the radiologist.  Liver cysts are common and normally do not cause problem.  She can have Prilosec samples if we have some available

## 2011-01-31 ENCOUNTER — Other Ambulatory Visit: Payer: Self-pay | Admitting: Family Medicine

## 2011-01-31 MED ORDER — ALPRAZOLAM 0.5 MG PO TABS: 0.5000 mg | ORAL_TABLET | Freq: Three times a day (TID) | ORAL | Status: DC | PRN

## 2011-01-31 NOTE — Telephone Encounter (Signed)
Last OV 12-18-10 last refill 01-01-11 #60 with no refills

## 2011-01-31 NOTE — Telephone Encounter (Signed)
.  rx faxed to pharmacy, manually.  

## 2011-01-31 NOTE — Telephone Encounter (Signed)
Ok for #60 

## 2011-02-19 ENCOUNTER — Other Ambulatory Visit: Payer: Self-pay | Admitting: Family Medicine

## 2011-02-19 MED ORDER — SIMVASTATIN 20 MG PO TABS: 20.0000 mg | ORAL_TABLET | Freq: Every day | ORAL | Status: DC

## 2011-02-19 NOTE — Telephone Encounter (Signed)
rx sent to pharmacy by e-script  

## 2011-02-24 ENCOUNTER — Other Ambulatory Visit: Payer: Self-pay | Admitting: Family Medicine

## 2011-02-24 MED ORDER — CITALOPRAM HYDROBROMIDE 20 MG PO TABS: 20.0000 mg | ORAL_TABLET | Freq: Every day | ORAL | Status: DC

## 2011-02-24 NOTE — Telephone Encounter (Signed)
rx sent to pharmacy by e-script  

## 2011-02-24 NOTE — Telephone Encounter (Signed)
Last OV 12-18-10 last refill 10-28-10 #30 with 3 refills

## 2011-02-24 NOTE — Telephone Encounter (Signed)
Ok for #30, 6 refills 

## 2011-03-05 ENCOUNTER — Other Ambulatory Visit: Payer: Self-pay | Admitting: Family Medicine

## 2011-03-05 NOTE — Telephone Encounter (Signed)
Last OV 12-18-10 last refill 01-31-11 #60 with no refills

## 2011-03-05 NOTE — Telephone Encounter (Signed)
Ok for #60, no refills 

## 2011-03-06 MED ORDER — ALPRAZOLAM 0.5 MG PO TABS: 0.5000 mg | ORAL_TABLET | Freq: Three times a day (TID) | ORAL | Status: DC | PRN

## 2011-03-06 NOTE — Telephone Encounter (Signed)
.  rx faxed to pharmacy, manually.  

## 2011-03-31 ENCOUNTER — Ambulatory Visit: Payer: Self-pay | Admitting: Family Medicine

## 2011-04-04 ENCOUNTER — Telehealth: Payer: Self-pay | Admitting: Family Medicine

## 2011-04-04 MED ORDER — ALPRAZOLAM 0.5 MG PO TABS: 0.5000 mg | ORAL_TABLET | Freq: Three times a day (TID) | ORAL | Status: DC | PRN

## 2011-04-04 NOTE — Telephone Encounter (Signed)
Refill: Alprazolam .5mg  tab. Last fill 03-07-11

## 2011-04-04 NOTE — Telephone Encounter (Signed)
Last OV 12-18-10 last refill 03-06-11 #60 with no refills

## 2011-04-04 NOTE — Telephone Encounter (Signed)
.  rx faxed to pharmacy, manually.  

## 2011-04-04 NOTE — Telephone Encounter (Signed)
Ok for #60, no refills 

## 2011-05-05 ENCOUNTER — Telehealth: Payer: Self-pay | Admitting: Family Medicine

## 2011-05-05 NOTE — Telephone Encounter (Signed)
Refill: Alprazolam .5mg  tab. Last fill 04-04-11

## 2011-05-06 MED ORDER — ALPRAZOLAM 0.5 MG PO TABS: 0.5000 mg | ORAL_TABLET | Freq: Three times a day (TID) | ORAL | Status: DC | PRN

## 2011-05-06 NOTE — Telephone Encounter (Signed)
Ok for #60 

## 2011-05-06 NOTE — Telephone Encounter (Signed)
.  rx faxed to pharmacy, manually.  

## 2011-05-06 NOTE — Telephone Encounter (Signed)
Last OV 12-18-10 last refill 04-04-11 #60 no refills

## 2011-05-30 ENCOUNTER — Other Ambulatory Visit: Payer: Self-pay | Admitting: Family Medicine

## 2011-05-30 MED ORDER — SIMVASTATIN 20 MG PO TABS: 20.0000 mg | ORAL_TABLET | Freq: Every day | ORAL | Status: DC

## 2011-05-30 NOTE — Telephone Encounter (Signed)
Refill for Simvastatin 20MG  TAB LUPI Qty 30  Last filled 3.21.2013 Take one tablet by mouth daily  Last OV 11.14.2012

## 2011-06-03 ENCOUNTER — Ambulatory Visit: Payer: Self-pay | Admitting: Family Medicine

## 2011-06-04 ENCOUNTER — Other Ambulatory Visit: Payer: Self-pay | Admitting: Family Medicine

## 2011-06-04 NOTE — Telephone Encounter (Signed)
Refill Alprazolam 0.5MG  TAB SUNP  Last OV 11.4.2, last 3 appt. Cancelled Last filled 4.2.13 qty 60 Instructions Take 1 tablet (0.5 mg total) by mouth 3 (three) times daily as needed for anxiety.

## 2011-06-05 MED ORDER — ALPRAZOLAM 0.5 MG PO TABS: 0.5000 mg | ORAL_TABLET | Freq: Three times a day (TID) | ORAL | Status: DC | PRN

## 2011-06-05 NOTE — Telephone Encounter (Signed)
Last OV 12-18-10 last refill 05-06-11 #60 no refills

## 2011-06-05 NOTE — Telephone Encounter (Signed)
Ok for #60, 3 refills 

## 2011-06-05 NOTE — Telephone Encounter (Signed)
.  rx faxed to pharmacy, manually.  

## 2011-06-17 ENCOUNTER — Ambulatory Visit (INDEPENDENT_AMBULATORY_CARE_PROVIDER_SITE_OTHER): Payer: Self-pay | Admitting: Family

## 2011-06-17 ENCOUNTER — Encounter: Payer: Self-pay | Admitting: Family

## 2011-06-17 VITALS — BP 120/68 | HR 58 | Temp 97.8°F | Resp 16 | Wt 115.0 lb

## 2011-06-17 DIAGNOSIS — R5383 Other fatigue: Secondary | ICD-10-CM

## 2011-06-17 DIAGNOSIS — N898 Other specified noninflammatory disorders of vagina: Secondary | ICD-10-CM

## 2011-06-17 DIAGNOSIS — N390 Urinary tract infection, site not specified: Secondary | ICD-10-CM

## 2011-06-17 DIAGNOSIS — R5381 Other malaise: Secondary | ICD-10-CM

## 2011-06-17 DIAGNOSIS — N949 Unspecified condition associated with female genital organs and menstrual cycle: Secondary | ICD-10-CM

## 2011-06-17 LAB — POCT URINALYSIS DIPSTICK
Bilirubin, UA: NEGATIVE
Glucose, UA: NEGATIVE
Ketones, UA: NEGATIVE
Leukocytes, UA: NEGATIVE
Nitrite, UA: NEGATIVE
Protein, UA: NEGATIVE
Spec Grav, UA: 1.01
Urobilinogen, UA: 0.2
pH, UA: 6

## 2011-06-17 LAB — CBC
HCT: 38.8 % (ref 36.0–46.0)
Hemoglobin: 13.2 g/dL (ref 12.0–15.0)
MCH: 31.3 pg (ref 26.0–34.0)
MCHC: 34 g/dL (ref 30.0–36.0)
MCV: 91.9 fL (ref 78.0–100.0)
Platelets: 228 10*3/uL (ref 150–400)
RBC: 4.22 MIL/uL (ref 3.87–5.11)
RDW: 13.2 % (ref 11.5–15.5)
WBC: 5.1 10*3/uL (ref 4.0–10.5)

## 2011-06-17 MED ORDER — CIPROFLOXACIN HCL 250 MG PO TABS: 250.0000 mg | ORAL_TABLET | Freq: Two times a day (BID) | ORAL | Status: AC

## 2011-06-17 NOTE — Patient Instructions (Signed)
Please complete your lab work prior to leaving.  Call if symptoms worsen, or if no improvement in 2-3 days.

## 2011-06-17 NOTE — Progress Notes (Signed)
Subjective:    Patient ID: Regina Gonzalez, female    DOB: 06-23-1945, 66 y.o.   MRN: [id number]  HPI  Reports feeling "viral" since Friday.  Reports some gerd symptoms.  No vomitting or diarrhea.  Notes some blood specs on her panties and was unsure "which end it came from."  Notes + vaginal odor.  Feel worse now than she did on Friday.   She notes some vaginal discomfort.  She had normal pap smear last year- reportedly normal.  No bloody bowels.  Had colo last march 2012- was told that this was negative.  She reports + nausea.  Not sexually active.      Review of Systems See HPI  Past Medical History  Diagnosis Date  . Allergy   . Anxiety   . Arthritis   . COPD (chronic obstructive pulmonary disease)   . Asthma   . Blood transfusion   . Depression   . GERD (gastroesophageal reflux disease)     Barrett's  . Hyperlipidemia   . Cyst of pancreas     benign  . Liver cyst     benign  . Kidney cysts     benign  . Osteoporosis     History   Social History  . Marital Status: Married    Spouse Name: N/A    Number of Children: N/A  . Years of Education: N/A   Occupational History  . [employer]      [employer]    Social History Main Topics  . Smoking status: Former Games developer  . Smokeless tobacco: Never Used  . Alcohol Use: No  . Drug Use: No  . Sexually Active: Not on file   Other Topics Concern  . Not on file   Social History Narrative  . No narrative on file    Past Surgical History  Procedure Date  . Appendectomy   . Breast,biopsies,bilateral     simple cysts  . Colonoscopy   . Upper gastrointestinal endoscopy   . Ovarian cyst removal     Family History  Problem Relation Age of Onset  . Colon cancer Neg Hx     Allergies  Allergen Reactions  . Erythromycin Other (See Comments)    Allergic to all mycin drugs gets severe stomach cramps  . Metronidazole     Current Outpatient Prescriptions on File Prior to Visit  Medication Sig Dispense  Refill  . ALPRAZolam (XANAX) 0.5 MG tablet Take 1 tablet (0.5 mg total) by mouth 3 (three) times daily as needed for anxiety.  60 tablet  3  . citalopram (CELEXA) 20 MG tablet Take 1 tablet (20 mg total) by mouth daily.  30 tablet  6  . dicyclomine (BENTYL) 20 MG tablet Take 20 mg by mouth 3 (three) times daily between meals as needed. Diarrhea and cramping       . fish oil-omega-3 fatty acids 1000 MG capsule Take 1 g by mouth daily.        . Multiple Vitamins-Minerals (MULTIVITAMIN WITH MINERALS) tablet Take 1 tablet by mouth daily.        Marland Kitchen omeprazole (PRILOSEC) 20 MG capsule Take 20 mg by mouth daily.        . simvastatin (ZOCOR) 20 MG tablet Take 1 tablet (20 mg total) by mouth daily.  30 tablet  3  . buPROPion (WELLBUTRIN) 75 MG tablet Take 1 tablet (75 mg total) by mouth 2 (two) times daily.  60 tablet  6  .  dexlansoprazole (DEXILANT) 60 MG capsule Take 60 mg by mouth daily.        . mupirocin (BACTROBAN) 2 % ointment Apply to affected area 3 times daily  22 g  0    BP 120/68  Pulse 58  Temp(Src) 97.8 F (36.6 C) (Oral)  Resp 16  Wt 115 lb (52.164 kg)  SpO2 99%  LMP 06/16/1997       Objective:   Physical Exam  Constitutional: She is oriented to person, place, and time. She appears well-developed and well-nourished.  HENT:  Head: Normocephalic and atraumatic.  Cardiovascular: Normal rate and regular rhythm.   No murmur heard. Pulmonary/Chest: Effort normal and breath sounds normal. No respiratory distress. She has no wheezes. She has no rales. She exhibits no tenderness.  Abdominal: Soft. Bowel sounds are normal.  Neurological: She is alert and oriented to person, place, and time. No cranial nerve deficit. She exhibits normal muscle tone. Coordination normal.  Psychiatric: She has a normal mood and affect. Her behavior is normal. Judgment and thought content normal.  GU: mild right sided CVAT. GYN: Normal uterus, vulva/vagina unremarkable, adnexa normal, uterus normal. +  external hemorrhoids are noted.         Assessment & Plan:

## 2011-06-18 ENCOUNTER — Telehealth: Payer: Self-pay | Admitting: *Deleted

## 2011-06-18 ENCOUNTER — Ambulatory Visit: Payer: Self-pay | Admitting: Family Medicine

## 2011-06-18 LAB — WET PREP BY MOLECULAR PROBE
Candida species: NEGATIVE
Gardnerella vaginalis: NEGATIVE
Trichomonas vaginosis: NEGATIVE

## 2011-06-18 NOTE — Telephone Encounter (Signed)
Pt called requesting lab results. Please advise.  

## 2011-06-18 NOTE — Telephone Encounter (Signed)
Returned pt's phone call.  Reviewed labs with pt.  Pt reports feeling a bit better today.

## 2011-06-19 ENCOUNTER — Telehealth: Payer: Self-pay | Admitting: Family

## 2011-06-19 DIAGNOSIS — N390 Urinary tract infection, site not specified: Secondary | ICD-10-CM | POA: Insufficient documentation

## 2011-06-19 LAB — URINE CULTURE
Colony Count: NO GROWTH
Organism ID, Bacteria: NO GROWTH

## 2011-06-19 NOTE — Telephone Encounter (Signed)
Pls call pt and let her know that her urine culture is negative. How is she feeling? If she is still having back pain, she should have a CT to evaluate for kidney stone.

## 2011-06-19 NOTE — Assessment & Plan Note (Signed)
UA shows moderate blood.  Rectal exam noted hemorrhoids, which I suspect was cause for her "blood stains" in her underwear. Pelvic exam in unremarkable and a wet prep is obtained today. Pt is uninsured and worried about cost.  Reasonable to start cipro and culture urine now, but if pt develops worsening back pain, will need to proceed with CT to exclude stone.  Pt verbalizes understanding.

## 2011-06-20 NOTE — Telephone Encounter (Signed)
Notified pt, she states she feels better overall. Back pain has eased. She will complete Cipro for symptom management. Pt will call us back if symptoms do not resolve or worsen.

## 2011-09-22 ENCOUNTER — Other Ambulatory Visit: Payer: Self-pay | Admitting: Family Medicine

## 2011-09-22 NOTE — Telephone Encounter (Signed)
Refill done.  

## 2011-10-08 ENCOUNTER — Telehealth: Payer: Self-pay | Admitting: Family Medicine

## 2011-10-08 MED ORDER — ALPRAZOLAM 0.5 MG PO TABS: 0.5000 mg | ORAL_TABLET | Freq: Three times a day (TID) | ORAL | Status: DC | PRN

## 2011-10-08 NOTE — Telephone Encounter (Signed)
Last OV 12-18-10 last refill 06-05-11 #60 with 3 refills

## 2011-10-08 NOTE — Telephone Encounter (Signed)
Refill: Alprazolam 0.5mg  tab. Last fill 09-10-11

## 2011-10-08 NOTE — Telephone Encounter (Signed)
.  rx faxed to pharmacy, manually. Called pt to advise to schedule CPE after 12-18-10, pt noted she still has no insurance but will look at her calender and call back to schedule, again noted that in order for her to continue to receive her medications she does need to be seen yearly, pt understood and will call back to schedule asap, MD Beverely Low made aware verbally

## 2011-10-08 NOTE — Telephone Encounter (Signed)
Ok for #60, 3 refills but please remind her that she was last seen 11/14- and needs to be seen yearly in order to continue prescribing meds.

## 2011-10-09 ENCOUNTER — Encounter: Payer: Self-pay | Admitting: Family Medicine

## 2011-10-09 ENCOUNTER — Ambulatory Visit (INDEPENDENT_AMBULATORY_CARE_PROVIDER_SITE_OTHER): Payer: Self-pay | Admitting: Family Medicine

## 2011-10-09 VITALS — BP 112/66 | HR 61 | Temp 98.6°F | Wt 116.2 lb

## 2011-10-09 DIAGNOSIS — R319 Hematuria, unspecified: Secondary | ICD-10-CM

## 2011-10-09 DIAGNOSIS — R300 Dysuria: Secondary | ICD-10-CM

## 2011-10-09 LAB — POCT URINALYSIS DIPSTICK
Bilirubin, UA: NEGATIVE
Glucose, UA: NEGATIVE
Ketones, UA: NEGATIVE
Leukocytes, UA: NEGATIVE
Nitrite, UA: NEGATIVE
Protein, UA: NEGATIVE
Spec Grav, UA: <=1.005
Urobilinogen, UA: 0.2
pH, UA: 6

## 2011-10-09 MED ORDER — CITALOPRAM HYDROBROMIDE 20 MG PO TABS: ORAL_TABLET | ORAL | Status: DC

## 2011-10-09 MED ORDER — CIPROFLOXACIN HCL 250 MG PO TABS: 250.0000 mg | ORAL_TABLET | Freq: Two times a day (BID) | ORAL | Status: DC

## 2011-10-09 MED ORDER — DICYCLOMINE HCL 20 MG PO TABS: 20.0000 mg | ORAL_TABLET | Freq: Three times a day (TID) | ORAL | Status: DC | PRN

## 2011-10-09 NOTE — Patient Instructions (Signed)

## 2011-10-09 NOTE — Progress Notes (Signed)
  Subjective:    KARLEA MCKIBBIN is a 66 y.o. female who complains of burning with urination, frequency, pain in the lower abdomen and suprapubic pressure. She has had symptoms for several days. Patient also complains of back pain and stomach ache. Patient denies congestion, cough, fever, headache, rhinitis, sorethroat, stomach ache and vaginal discharge. Patient does not have a history of recurrent UTI. Patient does not have a history of pyelonephritis.   The following portions of the patient's history were reviewed and updated as appropriate: allergies, current medications, past family history, past medical history, past social history, past surgical history and problem list.  Review of Systems Pertinent items are noted in HPI.    Objective:    BP 112/66  Pulse 61  Temp 98.6 F (37 C) (Oral)  Wt 116 lb 3.2 oz (52.708 kg)  SpO2 99%  LMP 06/16/1997 General appearance: alert, cooperative, appears stated age and no distress Back: some flank pain b/l Abdomen: abnormal findings:  mild tenderness in the lower abdomen  Laboratory:  Urine dipstick: mod for hemoglobin.   Micro exam: not done.    Assessment:    dysuria     Plan:    Medications: ciprofloxacin. Maintain adequate hydration. Follow up if symptoms not improving, and as needed.

## 2011-10-10 ENCOUNTER — Ambulatory Visit: Payer: Self-pay | Admitting: Family Medicine

## 2011-10-11 LAB — URINE CULTURE
Colony Count: NO GROWTH
Organism ID, Bacteria: NO GROWTH

## 2011-10-16 ENCOUNTER — Telehealth: Payer: Self-pay | Admitting: Family Medicine

## 2011-10-16 NOTE — Telephone Encounter (Signed)
Please call pt tomorrow 

## 2011-10-16 NOTE — Telephone Encounter (Signed)
Caller complaining or feeling "run down and aching". " Feels queasy". Headache . Back aching. Denies nasal congestion but states she can feel some post nasal drip present. Occas. cough.  Stools were mushy green bowel movement earlier this week  but have now stopped. Denies pain with urination. Denies blood in urine. Denies fever. Caller seen in clinic on 10/09/11 for possible UTI sxs. Urine culture negative.  Patient states she has not felt well prior to coming into clinic visit on 09/05. Today she  unable to go to work today due to having "flulike symptoms" and has been in the bed".  All emergent symptoms ruled out per Flu-Like Symptoms with exception " flulike illness that has not improved after 7 days of home care. Advised should be seen in clinic tomorrow 09/13 and in ED for worsening symptoms. Caller states she does not have insurance and is resistant to making an appointment. Recommened patient contact clinic tomorrow and discuss payment arrangements if still not feeling well.  Will update Dr. Laury Axon.

## 2011-10-17 ENCOUNTER — Telehealth: Payer: Self-pay | Admitting: Family Medicine

## 2011-10-17 NOTE — Telephone Encounter (Signed)
Spoke with pt she states she is feeling a little better but still having muscle aches & nausea. Pt states she just feels like she has a virus.

## 2011-10-17 NOTE — Telephone Encounter (Signed)
Please call pt and see how she is feeling- likely has a viral illness

## 2011-10-17 NOTE — Telephone Encounter (Signed)
Spoke with pt & she states she is feeling a little better.

## 2011-10-17 NOTE — Telephone Encounter (Signed)
Glad she is feeling better.  Please call again on Monday to check on her.

## 2011-10-17 NOTE — Telephone Encounter (Signed)
Caller: Jacklin/Patient; Phone: [phone]; Reason for Call: Returning Dr.  Rennis Golden nurses call, please call her back.  Thanks

## 2011-10-17 NOTE — Telephone Encounter (Signed)
Called pt and she noted that she just woke up so not really sure how she is feeling yet, but notes already notes the aching feeling is still present and she feels that this a virus as well, pt will call back to update office advise how she is feeling. MD Beverely Low made aware verbally

## 2011-10-20 ENCOUNTER — Encounter: Payer: Self-pay | Admitting: Family Medicine

## 2011-10-20 ENCOUNTER — Ambulatory Visit (INDEPENDENT_AMBULATORY_CARE_PROVIDER_SITE_OTHER): Payer: Self-pay | Admitting: Family Medicine

## 2011-10-20 ENCOUNTER — Telehealth: Payer: Self-pay | Admitting: Family Medicine

## 2011-10-20 VITALS — BP 115/78 | HR 68 | Temp 97.8°F | Ht 62.5 in | Wt 114.4 lb

## 2011-10-20 DIAGNOSIS — R1085 Abdominal pain of multiple sites: Secondary | ICD-10-CM | POA: Insufficient documentation

## 2011-10-20 DIAGNOSIS — R5383 Other fatigue: Secondary | ICD-10-CM

## 2011-10-20 DIAGNOSIS — R5381 Other malaise: Secondary | ICD-10-CM

## 2011-10-20 DIAGNOSIS — R109 Unspecified abdominal pain: Secondary | ICD-10-CM

## 2011-10-20 LAB — CBC WITH DIFFERENTIAL/PLATELET
Basophils Absolute: 0 10*3/uL (ref 0.0–0.1)
Basophils Relative: 0.4 % (ref 0.0–3.0)
Eosinophils Absolute: 0.1 10*3/uL (ref 0.0–0.7)
Eosinophils Relative: 4.1 % (ref 0.0–5.0)
HCT: 42.3 % (ref 36.0–46.0)
Hemoglobin: 13.8 g/dL (ref 12.0–15.0)
Lymphocytes Relative: 43.9 % (ref 12.0–46.0)
Lymphs Abs: 1.5 10*3/uL (ref 0.7–4.0)
MCHC: 32.7 g/dL (ref 30.0–36.0)
MCV: 94.8 fl (ref 78.0–100.0)
Monocytes Absolute: 0.3 10*3/uL (ref 0.1–1.0)
Monocytes Relative: 8.9 % (ref 3.0–12.0)
Neutro Abs: 1.5 10*3/uL (ref 1.4–7.7)
Neutrophils Relative %: 42.7 % — ABNORMAL LOW (ref 43.0–77.0)
Platelets: 211 10*3/uL (ref 150.0–400.0)
RBC: 4.46 Mil/uL (ref 3.87–5.11)
RDW: 13.3 % (ref 11.5–14.6)
WBC: 3.4 10*3/uL — ABNORMAL LOW (ref 4.5–10.5)

## 2011-10-20 LAB — HEPATIC FUNCTION PANEL
ALT: 22 U/L (ref 0–35)
AST: 23 U/L (ref 0–37)
Albumin: 4.3 g/dL (ref 3.5–5.2)
Alkaline Phosphatase: 48 U/L (ref 39–117)
Bilirubin, Direct: 0.1 mg/dL (ref 0.0–0.3)
Total Bilirubin: 0.6 mg/dL (ref 0.3–1.2)
Total Protein: 6.9 g/dL (ref 6.0–8.3)

## 2011-10-20 LAB — H. PYLORI ANTIBODY, IGG: H Pylori IgG: NEGATIVE

## 2011-10-20 NOTE — Assessment & Plan Note (Signed)
Recurrent issue.  Check labs to r/o infxn or metabolic abnormality.  Suspect this is more depression related as pt's PE is nonspecific and nonfocal.  Pt even admits- 'i'll hurt here one day and the next day it's somewhere else.'.  Encouraged her to resume counseling w/ hospice.  Will follow.

## 2011-10-20 NOTE — Progress Notes (Signed)
  Subjective:    Patient ID: Regina Gonzalez, female    DOB: 1945-05-01, 66 y.o.   MRN: [id number]  HPI Recently tx'd for UTI but cx was negative.  Having back aches, HAs, fatigue, stomach discomfort- improved w/ gas-x.  + constipation.  No fevers.  + nausea, no vomiting.  HAs are frontal- dull constant.  Denies sinus pain, nasal congestion/drainage.  Back pain is intermittent.  No change in activity or heavy lifting.  sxs started 2 weeks ago.  tx'd w/ cipro.   Review of Systems For ROS see HPI     Objective:   Physical Exam  Vitals reviewed. Constitutional: She is oriented to person, place, and time. She appears well-developed and well-nourished. No distress.  HENT:  Head: Normocephalic and atraumatic.  Nose: Nose normal.  Mouth/Throat: Oropharynx is clear and moist. No oropharyngeal exudate.       TMs normal No TTP over sinuses  Eyes: Conjunctivae normal and EOM are normal. Pupils are equal, round, and reactive to light.  Neck: Normal range of motion. Neck supple.  Cardiovascular: Normal rate, regular rhythm and normal heart sounds.   Pulmonary/Chest: Effort normal and breath sounds normal. No respiratory distress. She has no wheezes. She has no rales.  Abdominal: Soft. Bowel sounds are normal. She exhibits no distension. There is tenderness (mild over RUQ/epigastrum). There is no rebound and no guarding.  Lymphadenopathy:    She has no cervical adenopathy.  Neurological: She is alert and oriented to person, place, and time.          Assessment & Plan:

## 2011-10-20 NOTE — Telephone Encounter (Signed)
Caller: Lasondra/Patient; Patient Name: Regina Gonzalez; PCP: Sheliah Hatch.; Best Callback Phone Number: [phone] States was seen in office 9-16 and had blood work. MD advised should be able to get results 9-16 in afternoon. Advised per MD note, no H Pylori, wbc slightly low and most likely viral. Is to "hang in there".

## 2011-10-20 NOTE — Telephone Encounter (Signed)
Pt was treated by MD Tabori in office today 

## 2011-10-20 NOTE — Patient Instructions (Addendum)
Schedule your complete physical for June We'll notify you of your lab results Continue Omeprazole for acid production, gas x as needed This may be a combo of illness and depression- consider calling Hospice again as we approach the anniversary Call with any questions or concerns, particularly if worsening Hang in there!

## 2011-10-20 NOTE — Telephone Encounter (Signed)
noted 

## 2011-10-20 NOTE — Assessment & Plan Note (Signed)
New.  Check labs to r/o recurrent H pylori infxn, liver abnormality, CBC to r/o infxn.  Continue omeprazole, add Gas-X prn.  Suspect that this is more emotional than physical w/ anniversary of mom's death approaching.  Discussed this w/ pt.  Will follow.

## 2011-10-24 ENCOUNTER — Telehealth: Payer: Self-pay | Admitting: Family Medicine

## 2011-10-24 MED ORDER — SIMVASTATIN 20 MG PO TABS: 20.0000 mg | ORAL_TABLET | Freq: Every day | ORAL | Status: DC

## 2011-10-24 MED ORDER — CITALOPRAM HYDROBROMIDE 20 MG PO TABS: ORAL_TABLET | ORAL | Status: DC

## 2011-10-24 NOTE — Telephone Encounter (Signed)
Refills x 2 last ov 9.16.13 acute  1-simvastatin 20mg  tab take one tablet by mouth daily #30 last fill 8.19.13  2-Celexa 20mg  tablet take one tablet by mouth daily, last fill per pharmacy 8.19.13 NOTE wrt last 9.5.13 #45 wt/2-refills, also sent to [facility]

## 2011-10-24 NOTE — Telephone Encounter (Signed)
Please advise 

## 2011-10-24 NOTE — Telephone Encounter (Signed)
Caller: Ramanda/Patient; Patient Name: Regina Gonzalez; PCP: Sheliah Hatch.; Best Callback Phone Number: [phone]; Reason for call: Has been sick for past 2 weeks.  Ruled out UTI was diagnosed with virus and elevated  WBCs.  Continues to feel bad with ache in head neck and shoulders as she as had for past 2 weeks.  Declined triage, just wanted to let Dr. Beverely Low know that she is no better--just has short spurts where she feels better for the ache to return.

## 2011-10-27 NOTE — Telephone Encounter (Signed)
At this time, we likely need to repeat CBC to see if counts have changed/improved.  This can all be related to her worsening depression due to the upcoming anniversary of mom's death.  i know that b/c she doesn't have insurance that a big work up is not possible.  We may need to adjust her depression meds.  Please call and see how she's doing.

## 2011-10-27 NOTE — Telephone Encounter (Signed)
Pt states she is not having any fever/chills or nausea, blood in stools, HA, abdominal pain.  She has just been very achy feeling all over.  If she develops any of the symptoms mentioned she will call back.

## 2011-10-27 NOTE — Telephone Encounter (Signed)
If she has fever, chills, increased abdominal pain, headaches, severe nausea, blood in the stools ---> needs to be seen. Otherwise, okay to wait for her PCP (back in 2 days)

## 2011-10-28 MED ORDER — CITALOPRAM HYDROBROMIDE 20 MG PO TABS: ORAL_TABLET | ORAL | Status: DC

## 2011-10-28 NOTE — Telephone Encounter (Signed)
Pt noted that she feels slightly better today, and pt was given instructions and noted she would like to increase depression medication if possible

## 2011-10-28 NOTE — Telephone Encounter (Signed)
Spoke to pt to advise results/instructions. Pt understood. Sent #60 via escribe for 2tabs daily Pt wants to know if she can still come in for a lab visit, per notes hx of mono as a child and noted low immune system and is willing to come in for a lab visit if that is ok with MD Beverely Low

## 2011-10-28 NOTE — Telephone Encounter (Signed)
Should increase Celexa to 2 tabs daily rather than 1.5.  Increase # of pills to #60

## 2011-10-28 NOTE — Telephone Encounter (Signed)
If she had mono, she will not get it again.  But she can always schedule an OV

## 2011-10-29 ENCOUNTER — Other Ambulatory Visit (INDEPENDENT_AMBULATORY_CARE_PROVIDER_SITE_OTHER): Payer: Self-pay

## 2011-10-29 DIAGNOSIS — D72829 Elevated white blood cell count, unspecified: Secondary | ICD-10-CM

## 2011-10-29 LAB — CBC WITH DIFFERENTIAL/PLATELET
Basophils Absolute: 0 10*3/uL (ref 0.0–0.1)
Basophils Relative: 0.8 % (ref 0.0–3.0)
Eosinophils Absolute: 0.2 10*3/uL (ref 0.0–0.7)
Eosinophils Relative: 4.1 % (ref 0.0–5.0)
HCT: 39 % (ref 36.0–46.0)
Hemoglobin: 13 g/dL (ref 12.0–15.0)
Lymphocytes Relative: 43.2 % (ref 12.0–46.0)
Lymphs Abs: 1.8 10*3/uL (ref 0.7–4.0)
MCHC: 33.4 g/dL (ref 30.0–36.0)
MCV: 93.8 fl (ref 78.0–100.0)
Monocytes Absolute: 0.3 10*3/uL (ref 0.1–1.0)
Monocytes Relative: 7.3 % (ref 3.0–12.0)
Neutro Abs: 1.9 10*3/uL (ref 1.4–7.7)
Neutrophils Relative %: 44.6 % (ref 43.0–77.0)
Platelets: 204 10*3/uL (ref 150.0–400.0)
RBC: 4.16 Mil/uL (ref 3.87–5.11)
RDW: 12.9 % (ref 11.5–14.6)
WBC: 4.3 10*3/uL — ABNORMAL LOW (ref 4.5–10.5)

## 2011-10-29 NOTE — Telephone Encounter (Signed)
Pt was scheduled for labs today, will call to advise results, pt understood

## 2011-10-31 ENCOUNTER — Telehealth: Payer: Self-pay | Admitting: Family Medicine

## 2011-10-31 NOTE — Telephone Encounter (Signed)
Caller: [name]/Patient; Patient Name: Regina Gonzalez; PCP: Sheliah Hatch.; Best Callback Phone Number: [phone]; Reason for call: follow up regarding labs.  Seen in office a few weeks ago, and white count was very very low.  States coming up slightly but is frustrated due to her fatigue.  States she cannot work for more than a few hours at a time.  States is discouraged, and wonders if she should "just go to bed until I feel better," or push through this.  Per Epic, lab results improving 10/29/11 over previous results 10/20/11.  States no significant change in fatigue, and no new symptoms.  Afebrile.  Per fatigue protocol, emergent symptoms denied; advised appointment within 2 weeks.  Declines offered appointment; states has no insurance.  Home care measures advised for now, with callback parameters given; to call office midweek if not improving.

## 2011-10-31 NOTE — Telephone Encounter (Signed)
Spoke to pt to advise results/instructions. Pt understood. Pt advised concern again about her low WBC, advised that the numbers are very tiny difference from normal...noted normal range as 4.5 pt number is 4.3, pt understood and will allow the medication to start working

## 2011-10-31 NOTE — Telephone Encounter (Signed)
We recently increased her depression meds- this will take time to work.  She should continue to be active, going to bed and staying there will likely make the situation worse

## 2011-11-24 ENCOUNTER — Other Ambulatory Visit: Payer: Self-pay | Admitting: Family Medicine

## 2011-11-26 NOTE — Telephone Encounter (Signed)
Please advise per last Lipid panal noted 09-19-10

## 2011-11-27 NOTE — Telephone Encounter (Signed)
Noted MD Tabori sent via escribe for #30 no refills

## 2011-12-22 ENCOUNTER — Encounter: Payer: Self-pay | Admitting: Family Medicine

## 2011-12-25 ENCOUNTER — Other Ambulatory Visit: Payer: Self-pay | Admitting: Family Medicine

## 2011-12-25 MED ORDER — SIMVASTATIN 20 MG PO TABS: ORAL_TABLET | ORAL | Status: DC

## 2011-12-25 NOTE — Telephone Encounter (Signed)
Rx sent 

## 2011-12-25 NOTE — Telephone Encounter (Signed)
refill Simvastatin (Tab) 20 MG TAKE ONE TABLET BY MOUTH AT BEDTIME #30 last fill 10.24.13 last ov 9.16.13 acute

## 2012-01-21 ENCOUNTER — Other Ambulatory Visit: Payer: Self-pay | Admitting: Family Medicine

## 2012-01-26 ENCOUNTER — Other Ambulatory Visit: Payer: Self-pay | Admitting: Family Medicine

## 2012-01-26 DIAGNOSIS — F419 Anxiety disorder, unspecified: Secondary | ICD-10-CM

## 2012-01-26 MED ORDER — ALPRAZOLAM 0.5 MG PO TABS: 0.5000 mg | ORAL_TABLET | Freq: Three times a day (TID) | ORAL | Status: DC | PRN

## 2012-01-26 NOTE — Telephone Encounter (Signed)
refill ALPRAZolam (Tab) 0.5 MG Take 1 tablet (0.5 mg total) by mouth 3 (three) times daily as needed for anxiety. --last fill 11.25.13 no qty listed

## 2012-01-26 NOTE — Telephone Encounter (Signed)
Script printed for alprazolam, needs to sign agreement and submit to UDS

## 2012-01-26 NOTE — Telephone Encounter (Signed)
Please advise on RF request.//AB/CMA 

## 2012-01-26 NOTE — Telephone Encounter (Signed)
Ok for #60, 1 refill- needs to complete controlled substance agreement and UDS

## 2012-01-27 ENCOUNTER — Encounter: Payer: Self-pay | Admitting: *Deleted

## 2012-01-27 NOTE — Telephone Encounter (Signed)
LM @ (8:24am) asking the pt to RTC.//AB/CMA

## 2012-01-27 NOTE — Telephone Encounter (Signed)
Discuss with patient will pick up Rx today.

## 2012-02-02 ENCOUNTER — Telehealth: Payer: Self-pay | Admitting: Family Medicine

## 2012-02-02 NOTE — Telephone Encounter (Signed)
Refill Simvastatin 20MG   #30, take one tablet by mouth at bedtime last fill 11.21.13--last ov/wt labs acute 9.16.13

## 2012-02-03 ENCOUNTER — Other Ambulatory Visit: Payer: Self-pay | Admitting: *Deleted

## 2012-02-03 DIAGNOSIS — E785 Hyperlipidemia, unspecified: Secondary | ICD-10-CM

## 2012-02-03 MED ORDER — SIMVASTATIN 20 MG PO TABS: ORAL_TABLET | ORAL | Status: DC

## 2012-02-03 NOTE — Telephone Encounter (Signed)
Refill for simvastatin sent to pharmacy 

## 2012-03-04 ENCOUNTER — Other Ambulatory Visit: Payer: Self-pay | Admitting: Family Medicine

## 2012-03-29 ENCOUNTER — Other Ambulatory Visit: Payer: Self-pay | Admitting: Family Medicine

## 2012-04-05 ENCOUNTER — Other Ambulatory Visit: Payer: Self-pay | Admitting: Family Medicine

## 2012-04-12 ENCOUNTER — Other Ambulatory Visit: Payer: Self-pay | Admitting: Family Medicine

## 2012-04-26 ENCOUNTER — Other Ambulatory Visit: Payer: Self-pay | Admitting: Family Medicine

## 2012-04-27 NOTE — Telephone Encounter (Signed)
Last OV 10/20/11, pending OV 07/05/2012-? Ok to fill until pending OV  Please advise

## 2012-04-30 ENCOUNTER — Other Ambulatory Visit: Payer: Self-pay | Admitting: Family Medicine

## 2012-05-17 ENCOUNTER — Telehealth: Payer: Self-pay | Admitting: Family Medicine

## 2012-05-17 DIAGNOSIS — F419 Anxiety disorder, unspecified: Secondary | ICD-10-CM

## 2012-05-17 NOTE — Telephone Encounter (Signed)
Refill: Alprazolam 0.5 mg tab. Last fill 04-19-12

## 2012-05-18 MED ORDER — ALPRAZOLAM 0.5 MG PO TABS: 0.5000 mg | ORAL_TABLET | Freq: Three times a day (TID) | ORAL | Status: DC | PRN

## 2012-05-18 NOTE — Telephone Encounter (Signed)
Last refill:04-19-12,last OV:10-20-11 and has an appt on 07-05-12.  Please advise.//AB/CMA

## 2012-05-18 NOTE — Telephone Encounter (Signed)
Refill x1 

## 2012-06-01 ENCOUNTER — Encounter (HOSPITAL_COMMUNITY): Payer: Self-pay | Admitting: *Deleted

## 2012-06-01 ENCOUNTER — Emergency Department (HOSPITAL_COMMUNITY): Payer: Self-pay

## 2012-06-01 ENCOUNTER — Emergency Department (HOSPITAL_COMMUNITY)
Admission: EM | Admit: 2012-06-01 | Discharge: 2012-06-01 | Disposition: A | Discharge status: Home / Self Care | Payer: Self-pay | Attending: Emergency Medicine | Admitting: Emergency Medicine

## 2012-06-01 ENCOUNTER — Telehealth: Payer: Self-pay | Admitting: Family Medicine

## 2012-06-01 ENCOUNTER — Other Ambulatory Visit: Payer: Self-pay

## 2012-06-01 DIAGNOSIS — Z79899 Other long term (current) drug therapy: Secondary | ICD-10-CM | POA: Insufficient documentation

## 2012-06-01 DIAGNOSIS — K2270 Barrett's esophagus without dysplasia: Secondary | ICD-10-CM | POA: Insufficient documentation

## 2012-06-01 DIAGNOSIS — E785 Hyperlipidemia, unspecified: Secondary | ICD-10-CM | POA: Insufficient documentation

## 2012-06-01 DIAGNOSIS — M129 Arthropathy, unspecified: Secondary | ICD-10-CM | POA: Insufficient documentation

## 2012-06-01 DIAGNOSIS — G459 Transient cerebral ischemic attack, unspecified: Secondary | ICD-10-CM | POA: Insufficient documentation

## 2012-06-01 DIAGNOSIS — R42 Dizziness and giddiness: Secondary | ICD-10-CM | POA: Insufficient documentation

## 2012-06-01 DIAGNOSIS — F3289 Other specified depressive episodes: Secondary | ICD-10-CM | POA: Insufficient documentation

## 2012-06-01 DIAGNOSIS — Z87448 Personal history of other diseases of urinary system: Secondary | ICD-10-CM | POA: Insufficient documentation

## 2012-06-01 DIAGNOSIS — J4489 Other specified chronic obstructive pulmonary disease: Secondary | ICD-10-CM | POA: Insufficient documentation

## 2012-06-01 DIAGNOSIS — R4789 Other speech disturbances: Secondary | ICD-10-CM | POA: Insufficient documentation

## 2012-06-01 DIAGNOSIS — Z87891 Personal history of nicotine dependence: Secondary | ICD-10-CM | POA: Insufficient documentation

## 2012-06-01 DIAGNOSIS — R51 Headache: Secondary | ICD-10-CM | POA: Insufficient documentation

## 2012-06-01 DIAGNOSIS — F411 Generalized anxiety disorder: Secondary | ICD-10-CM | POA: Insufficient documentation

## 2012-06-01 DIAGNOSIS — J449 Chronic obstructive pulmonary disease, unspecified: Secondary | ICD-10-CM | POA: Insufficient documentation

## 2012-06-01 DIAGNOSIS — Z8739 Personal history of other diseases of the musculoskeletal system and connective tissue: Secondary | ICD-10-CM | POA: Insufficient documentation

## 2012-06-01 DIAGNOSIS — F329 Major depressive disorder, single episode, unspecified: Secondary | ICD-10-CM | POA: Insufficient documentation

## 2012-06-01 DIAGNOSIS — Z8719 Personal history of other diseases of the digestive system: Secondary | ICD-10-CM | POA: Insufficient documentation

## 2012-06-01 LAB — TROPONIN I: Troponin I: <0.3 ng/mL (ref <0.30)

## 2012-06-01 LAB — URINALYSIS, ROUTINE W REFLEX MICROSCOPIC
Bilirubin Urine: NEGATIVE
Glucose, UA: NEGATIVE mg/dL
Ketones, ur: NEGATIVE mg/dL
Leukocytes, UA: NEGATIVE
Nitrite: NEGATIVE
Protein, ur: NEGATIVE mg/dL
Specific Gravity, Urine: 1.007 (ref 1.005–1.030)
Urobilinogen, UA: 0.2 mg/dL (ref 0.0–1.0)
pH: 7.5 (ref 5.0–8.0)

## 2012-06-01 LAB — COMPREHENSIVE METABOLIC PANEL
ALT: 21 U/L (ref 0–35)
AST: 24 U/L (ref 0–37)
Albumin: 4.2 g/dL (ref 3.5–5.2)
Alkaline Phosphatase: 56 U/L (ref 39–117)
BUN: 11 mg/dL (ref 6–23)
CO2: 31 mEq/L (ref 19–32)
Calcium: 10.2 mg/dL (ref 8.4–10.5)
Chloride: 101 mEq/L (ref 96–112)
Creatinine, Ser: 0.86 mg/dL (ref 0.50–1.10)
GFR calc Af Amer: 81 mL/min — ABNORMAL LOW (ref >90)
GFR calc non Af Amer: 70 mL/min — ABNORMAL LOW (ref >90)
Glucose, Bld: 87 mg/dL (ref 70–99)
Potassium: 4.1 mEq/L (ref 3.5–5.1)
Sodium: 139 mEq/L (ref 135–145)
Total Bilirubin: 0.3 mg/dL (ref 0.3–1.2)
Total Protein: 7.1 g/dL (ref 6.0–8.3)

## 2012-06-01 LAB — DIFFERENTIAL
Basophils Absolute: 0 10*3/uL (ref 0.0–0.1)
Basophils Relative: 1 % (ref 0–1)
Eosinophils Absolute: 0.1 10*3/uL (ref 0.0–0.7)
Eosinophils Relative: 3 % (ref 0–5)
Lymphocytes Relative: 34 % (ref 12–46)
Lymphs Abs: 1.5 10*3/uL (ref 0.7–4.0)
Monocytes Absolute: 0.4 10*3/uL (ref 0.1–1.0)
Monocytes Relative: 9 % (ref 3–12)
Neutro Abs: 2.4 10*3/uL (ref 1.7–7.7)
Neutrophils Relative %: 54 % (ref 43–77)

## 2012-06-01 LAB — RAPID URINE DRUG SCREEN, HOSP PERFORMED
Amphetamines: NOT DETECTED
Barbiturates: NOT DETECTED
Benzodiazepines: NOT DETECTED
Cocaine: NOT DETECTED
Opiates: NOT DETECTED
Tetrahydrocannabinol: NOT DETECTED

## 2012-06-01 LAB — GLUCOSE, CAPILLARY: Glucose-Capillary: 86 mg/dL (ref 70–99)

## 2012-06-01 LAB — CBC
HCT: 39.3 % (ref 36.0–46.0)
Hemoglobin: 14.1 g/dL (ref 12.0–15.0)
MCH: 31.7 pg (ref 26.0–34.0)
MCHC: 35.9 g/dL (ref 30.0–36.0)
MCV: 88.3 fL (ref 78.0–100.0)
Platelets: 211 10*3/uL (ref 150–400)
RBC: 4.45 MIL/uL (ref 3.87–5.11)
RDW: 13.1 % (ref 11.5–15.5)
WBC: 4.4 10*3/uL (ref 4.0–10.5)

## 2012-06-01 LAB — SEDIMENTATION RATE: Sed Rate: 2 mm/hr (ref 0–22)

## 2012-06-01 LAB — URINE MICROSCOPIC-ADD ON

## 2012-06-01 LAB — PROTIME-INR
INR: 0.96 (ref 0.00–1.49)
Prothrombin Time: 12.7 seconds (ref 11.6–15.2)

## 2012-06-01 LAB — POCT I-STAT TROPONIN I: Troponin i, poc: 0 ng/mL (ref 0.00–0.08)

## 2012-06-01 LAB — APTT: aPTT: 29 seconds (ref 24–37)

## 2012-06-01 LAB — ETHANOL: Alcohol, Ethyl (B): <11 mg/dL (ref 0–11)

## 2012-06-01 MED ORDER — LORAZEPAM 1 MG PO TABS
1.0000 mg | ORAL_TABLET | Freq: Once | ORAL | Status: AC
  Administered 2012-06-01: 1 mg via ORAL
  Filled 2012-06-01: qty 1

## 2012-06-01 MED ORDER — TETRACAINE HCL 0.5 % OP SOLN
2.0000 [drp] | Freq: Once | OPHTHALMIC | Status: AC
  Administered 2012-06-01: 2 [drp] via OPHTHALMIC
  Filled 2012-06-01: qty 2

## 2012-06-01 MED ORDER — GADOBENATE DIMEGLUMINE 529 MG/ML IV SOLN: 10.0000 mL | Freq: Once | INTRAVENOUS | Status: DC

## 2012-06-01 NOTE — Telephone Encounter (Signed)
Pt called, no answer. Dr. Beverely Low agrees with Call a nurse pt needs to go to ED.

## 2012-06-01 NOTE — Telephone Encounter (Signed)
Patient Information:  Caller Name: Angelize  Phone: [phone]  Patient: Regina Gonzalez, Regina Gonzalez  Gender: Female  DOB: September 12, 1945  Age: 67 Years  PCP: Sheliah Hatch.  Office Follow Up:  Does the office need to follow up with this patient?: No  Instructions For The Office: N/A  RN Note:  Caller interrupted call to take another call.  I called pt back and she said she spoke to the nurse and was told to go to the ED. Symptoms  Reason For Call & Symptoms: Pressure behind left eye.  Pt experiencing  a shooting  pain that runs up into her head.  Reviewed Health History In EMR: Yes  Reviewed Medications In EMR: Yes  Reviewed Allergies In EMR: Yes  Reviewed Surgeries / Procedures: Yes  Date of Onset of Symptoms: 05/31/2012  Treatments Tried: Sinus pill  Treatments Tried Worked: No  Guideline(s) Used:  No Protocol Available - Sick Adult  Disposition Per Guideline:     Reason For Disposition Reached:   Advice Given: Follow advice given  Patient Will Follow Care Advice:  YES

## 2012-06-01 NOTE — ED Notes (Signed)
To ED for eval of left face/head numbness and pain since yesterday. States she noticed a cple times yesterday that she speech changed. No neuro deficits noted today.

## 2012-06-01 NOTE — ED Provider Notes (Signed)
History     CSN: [id number]  Arrival date & time 06/01/12  1030   First MD Initiated Contact with Patient 06/01/12 1121      Chief Complaint  Patient presents with  . Numbness  . Headache    (Consider location/radiation/quality/duration/timing/severity/associated sxs/prior treatment) HPI Comments: Patient reports waking yesterday morning with a dull left-sided headache associated with pressure behind her left eye.  frequently she gets "lightening bolt" of pain in her posterior head it lasts a second and resolved. His has happened multiple times yesterday and today. She reports 2 episodes of speech difficulty yesterday to have resolved. she states she cannot get the words out she wanted to say. Denies any focal weakness, numbness or tingling currently. She has had tingling in left side of her face intermittently over the past 2 days as well. Denies any dizziness, lightheadedness, chest pain or shortness of breath. No visual changes. She's a history of hyperlipidemia, COPD and anxiety.   The history is provided by the patient.    Past Medical History  Diagnosis Date  . Allergy   . Anxiety   . Arthritis   . COPD (chronic obstructive pulmonary disease)   . Asthma   . Blood transfusion   . Depression   . GERD (gastroesophageal reflux disease)     Barrett's  . Hyperlipidemia   . Cyst of pancreas     benign  . Liver cyst     benign  . Kidney cysts     benign  . Osteoporosis     Past Surgical History  Procedure Laterality Date  . Appendectomy    . Breast,biopsies,bilateral      simple cysts  . Colonoscopy    . Upper gastrointestinal endoscopy    . Ovarian cyst removal      Family History  Problem Relation Age of Onset  . Colon cancer Neg Hx     History  Substance Use Topics  . Smoking status: Former Games developer  . Smokeless tobacco: Never Used  . Alcohol Use: No    OB History   Grav Para Term Preterm Abortions TAB SAB Ect Mult Living                  Review of  Systems  Constitutional: Negative for fever, activity change and appetite change.  HENT: Negative for congestion and rhinorrhea.   Respiratory: Negative for cough, chest tightness and shortness of breath.   Cardiovascular: Negative for chest pain.  Gastrointestinal: Negative for nausea, vomiting and abdominal pain.  Genitourinary: Negative for dysuria and hematuria.  Musculoskeletal: Negative for back pain.  Neurological: Positive for speech difficulty, light-headedness, numbness and headaches. Negative for dizziness, facial asymmetry and weakness.  A complete 10 system review of systems was obtained and all systems are negative except as noted in the HPI and PMH.    Allergies  Erythromycin and Metronidazole  Home Medications   Current Outpatient Rx  Name  Route  Sig  Dispense  Refill  . ALPRAZolam (XANAX) 0.5 MG tablet   Oral   Take 0.5-1 mg by mouth 3 (three) times daily as needed for anxiety.         . calcium citrate (CALCITRATE - DOSED IN MG ELEMENTAL CALCIUM) 950 MG tablet   Oral   Take 1 tablet by mouth 3 (three) times a week. Alternates with Multi-vitamin         . citalopram (CELEXA) 20 MG tablet   Oral   Take 30 mg by mouth daily.         Marland Kitchen  fish oil-omega-3 fatty acids 1000 MG capsule   Oral   Take 1 g by mouth See admin instructions. Five times a week         . Multiple Vitamins-Minerals (MULTIVITAMIN WITH MINERALS) tablet   Oral   Take 1 tablet by mouth 4 (four) times a week. Alternates with Caltrate         . omeprazole (PRILOSEC) 20 MG capsule   Oral   Take 20 mg by mouth daily.           . simvastatin (ZOCOR) 20 MG tablet   Oral   Take 10 mg by mouth every evening.           BP 132/58  Pulse 61  Temp(Src) 97.9 F (36.6 C)  Resp 13  SpO2 100%  LMP 06/16/1997  Physical Exam  Constitutional: She is oriented to person, place, and time. She appears well-developed and well-nourished. No distress.  HENT:  Head: Normocephalic and  atraumatic.  Mouth/Throat: Oropharynx is clear and moist. No oropharyngeal exudate.  No temporal artery tenderness  Eyes: Conjunctivae and EOM are normal. Pupils are equal, round, and reactive to light.  Neck: Normal range of motion. Neck supple.  Cardiovascular: Normal rate, regular rhythm and normal heart sounds.   No murmur heard. Pulmonary/Chest: Effort normal and breath sounds normal. No respiratory distress.  Abdominal: Soft. There is no tenderness. There is no guarding.  Musculoskeletal: Normal range of motion. She exhibits no edema and no tenderness.  Neurological: She is alert and oriented to person, place, and time. No cranial nerve deficit. She exhibits normal muscle tone. Coordination normal.  Cranial nerves 2-12 intact, 5 out of 5 strength throughout, no nystagmus, no pronator drift, Romberg negative. No ataxia on finger to nose Normal gait.  Skin: Skin is warm.    ED Course  Procedures (including critical care time)  Labs Reviewed  COMPREHENSIVE METABOLIC PANEL - Abnormal; Notable for the following:    GFR calc non Af Amer 70 (*)    GFR calc Af Amer 81 (*)    All other components within normal limits  URINALYSIS, ROUTINE W REFLEX MICROSCOPIC - Abnormal; Notable for the following:    Hgb urine dipstick SMALL (*)    All other components within normal limits  ETHANOL  PROTIME-INR  APTT  CBC  DIFFERENTIAL  TROPONIN I  URINE RAPID DRUG SCREEN (HOSP PERFORMED)  SEDIMENTATION RATE  GLUCOSE, CAPILLARY  URINE MICROSCOPIC-ADD ON  POCT I-STAT TROPONIN I   Ct Head Wo Contrast  06/01/2012  *RADIOLOGY REPORT*  Clinical Data: Headache, left facial numbness.  CT HEAD WITHOUT CONTRAST  Technique:  Contiguous axial images were obtained from the base of the skull through the vertex without contrast.  Comparison: None.  Findings: No acute intracranial abnormality.  Specifically, no hemorrhage, hydrocephalus, mass lesion, acute infarction, or significant intracranial injury.  No acute  calvarial abnormality. Visualized paranasal sinuses and mastoids clear.  Orbital soft tissues unremarkable.  IMPRESSION: No acute intracranial abnormality.   Original Report Authenticated By: Charlett Nose, M.D.    Mr Angiogram Almyra Deforest Wo Contrast  06/01/2012  *RADIOLOGY REPORT*  Clinical Data:  Headache.  Pressure behind left eye.  MRI HEAD WITHOUT AND WITH CONTRAST MRA HEAD WITHOUT CONTRAST MRA NECK WITHOUT AND WITH CONTRAST  Technique:  Multiplanar, multiecho pulse sequences of the brain and surrounding structures were obtained without and with intravenous contrast.  Angiographic images of the Circle of Willis were obtained using MRA technique without intravenous contrast. Angiographic images  of the neck were obtained using MRA technique without and with intravenous contrast.  Carotid stenosis measurements (when applicable) are obtained utilizing NASCET criteria, using the distal internal carotid diameter as the denominator.  Contrast:  10 ml Multihance  Comparison:  CT 06/01/2012 today  MRI HEAD  Findings:  Negative for acute infarct.  Scattered small white matter hyperintensities bilaterally with a chronic appearance.  Dilated perivascular space in the right lower basal ganglia.  Brainstem and cerebellum are intact.  Negative for hemorrhage or fluid collection.  No mass or edema.  Normal enhancement following contrast administration.  IMPRESSION: No acute abnormality.  Mild changes in the cerebral white matter are most likely due to chronic microvascular ischemia.  MRA HEAD  Findings: Both vertebral arteries are patent to the basilar.  PICA is patent bilaterally.  Superior cerebellar and posterior cerebral arteries are widely patent bilaterally.  Right internal carotid artery  is widely patent.  Right anterior and middle cerebral arteries are widely patent  Left internal carotid artery widely patent.  Left anterior and middle cerebral arteries widely patent.  Negative for cerebral aneurysm.  IMPRESSION:  Negative  MRA NECK  Findings: Standard branching of the aortic arch.  Proximal great vessels are widely patent.  Both carotid arteries are widely patent.  No evidence of carotid stenosis or dissection.  Left vertebral artery is dominant and widely patent.  Right vertebral artery also widely patent.  Mild tortuosity of the vertebral arteries consistent with hypertension.  IMPRESSION: No significant carotid or vertebral stenosis.   Original Report Authenticated By: Janeece Riggers, M.D.    Mr Angiogram Neck W Wo Contrast  06/01/2012  *RADIOLOGY REPORT*  Clinical Data:  Headache.  Pressure behind left eye.  MRI HEAD WITHOUT AND WITH CONTRAST MRA HEAD WITHOUT CONTRAST MRA NECK WITHOUT AND WITH CONTRAST  Technique:  Multiplanar, multiecho pulse sequences of the brain and surrounding structures were obtained without and with intravenous contrast.  Angiographic images of the Circle of Willis were obtained using MRA technique without intravenous contrast. Angiographic images of the neck were obtained using MRA technique without and with intravenous contrast.  Carotid stenosis measurements (when applicable) are obtained utilizing NASCET criteria, using the distal internal carotid diameter as the denominator.  Contrast:  10 ml Multihance  Comparison:  CT 06/01/2012 today  MRI HEAD  Findings:  Negative for acute infarct.  Scattered small white matter hyperintensities bilaterally with a chronic appearance.  Dilated perivascular space in the right lower basal ganglia.  Brainstem and cerebellum are intact.  Negative for hemorrhage or fluid collection.  No mass or edema.  Normal enhancement following contrast administration.  IMPRESSION: No acute abnormality.  Mild changes in the cerebral white matter are most likely due to chronic microvascular ischemia.  MRA HEAD  Findings: Both vertebral arteries are patent to the basilar.  PICA is patent bilaterally.  Superior cerebellar and posterior cerebral arteries are widely patent  bilaterally.  Right internal carotid artery  is widely patent.  Right anterior and middle cerebral arteries are widely patent  Left internal carotid artery widely patent.  Left anterior and middle cerebral arteries widely patent.  Negative for cerebral aneurysm.  IMPRESSION: Negative  MRA NECK  Findings: Standard branching of the aortic arch.  Proximal great vessels are widely patent.  Both carotid arteries are widely patent.  No evidence of carotid stenosis or dissection.  Left vertebral artery is dominant and widely patent.  Right vertebral artery also widely patent.  Mild tortuosity of the vertebral arteries consistent  with hypertension.  IMPRESSION: No significant carotid or vertebral stenosis.   Original Report Authenticated By: Janeece Riggers, M.D.    Mr Brain Wo Contrast  06/01/2012  *RADIOLOGY REPORT*  Clinical Data:  Headache.  Pressure behind left eye.  MRI HEAD WITHOUT AND WITH CONTRAST MRA HEAD WITHOUT CONTRAST MRA NECK WITHOUT AND WITH CONTRAST  Technique:  Multiplanar, multiecho pulse sequences of the brain and surrounding structures were obtained without and with intravenous contrast.  Angiographic images of the Circle of Willis were obtained using MRA technique without intravenous contrast. Angiographic images of the neck were obtained using MRA technique without and with intravenous contrast.  Carotid stenosis measurements (when applicable) are obtained utilizing NASCET criteria, using the distal internal carotid diameter as the denominator.  Contrast:  10 ml Multihance  Comparison:  CT 06/01/2012 today  MRI HEAD  Findings:  Negative for acute infarct.  Scattered small white matter hyperintensities bilaterally with a chronic appearance.  Dilated perivascular space in the right lower basal ganglia.  Brainstem and cerebellum are intact.  Negative for hemorrhage or fluid collection.  No mass or edema.  Normal enhancement following contrast administration.  IMPRESSION: No acute abnormality.  Mild  changes in the cerebral white matter are most likely due to chronic microvascular ischemia.  MRA HEAD  Findings: Both vertebral arteries are patent to the basilar.  PICA is patent bilaterally.  Superior cerebellar and posterior cerebral arteries are widely patent bilaterally.  Right internal carotid artery  is widely patent.  Right anterior and middle cerebral arteries are widely patent  Left internal carotid artery widely patent.  Left anterior and middle cerebral arteries widely patent.  Negative for cerebral aneurysm.  IMPRESSION: Negative  MRA NECK  Findings: Standard branching of the aortic arch.  Proximal great vessels are widely patent.  Both carotid arteries are widely patent.  No evidence of carotid stenosis or dissection.  Left vertebral artery is dominant and widely patent.  Right vertebral artery also widely patent.  Mild tortuosity of the vertebral arteries consistent with hypertension.  IMPRESSION: No significant carotid or vertebral stenosis.   Original Report Authenticated By: Janeece Riggers, M.D.      1. TIA (transient ischemic attack)       MDM  Dull headache since yesterday with intermittent lightening bolt pain, speech difficulty and facial numbness. nonfocal exam without motor deficit or speech deficit.  Not tPA candidate due to delayed presentation and improving symptoms.  CT negative. Labs unremarkable. Case discussed with Dr. Leroy Kennedy neurology. Agrees with MRI MRA to evaluate for CVA. ABCD2 score 3.  Extensive discussion with the patient. I explained to her MRI is negative for acute stroke and MRA is negative for large vascular stenosis or occlusion. However her ABCD 2 score of 3 days giving her a 1-2% risk of having a stroke. Offered admission observation which she declined as she has no insurance. Patient fits low risk group profile.  Will start 81 mg ASA.  Followup with neurology and PCP.   Date: 06/01/2012  Rate: 81  Rhythm: normal sinus rhythm  QRS Axis: normal   Intervals: normal  ST/T Wave abnormalities: nonspecific ST/T changes  Conduction Disutrbances:none  Narrative Interpretation:   Old EKG Reviewed: none available    Glynn Octave, MD 06/01/12 2007

## 2012-06-01 NOTE — ED Notes (Signed)
Pt wore glasses for vision screening.

## 2012-06-01 NOTE — Telephone Encounter (Signed)
Patient Information:  Caller Name: Benisha  Phone: [phone]  Patient: Regina Gonzalez, Regina Gonzalez  Gender: Female  DOB: 06-08-1945  Age: 67 Years  PCP: Sheliah Hatch.  Office Follow Up:  Does the office need to follow up with this patient?: No  Instructions For The Office: N/A  RN Note:  Intermittent posterior headache with mild pressure/pain behind left eye.  No loss of vision. Reported  slight numbness in face and eye. BP 124/63 at 0700 in right arm.  No appointments remain in office.  Due to neurological symptoms, advised to go to [facility] ED now.   Symptoms  Reason For Call & Symptoms: Emergent call:  Pressure and pain behind left eye and shooting posterior headache.  Reviewed Health History In EMR: Yes  Reviewed Medications In EMR: Yes  Reviewed Allergies In EMR: Yes  Reviewed Surgeries / Procedures: Yes  Date of Onset of Symptoms: 05/31/2012  Treatments Tried: checked BP  Treatments Tried Worked: No  Guideline(s) Used:  Eye Pain  Disposition Per Guideline:   Go to ED Now (or to Office with PCP Approval)  Reason For Disposition Reached:   Patient sounds very sick or weak to the triager  Advice Given:  N/A  RN Overrode Recommendation:  Go To ED  With eye symptoms and facial numbness, advised to go to ED now.

## 2012-06-01 NOTE — ED Notes (Signed)
Pt's CBG is 86 mg/dl.RN notified.

## 2012-06-01 NOTE — ED Notes (Signed)
Patient back from CT. Placed back on monitor

## 2012-06-02 ENCOUNTER — Other Ambulatory Visit (HOSPITAL_COMMUNITY): Payer: Self-pay | Admitting: Emergency Medicine

## 2012-06-02 ENCOUNTER — Telehealth: Payer: Self-pay | Admitting: Family Medicine

## 2012-06-02 DIAGNOSIS — G8929 Other chronic pain: Secondary | ICD-10-CM

## 2012-06-02 DIAGNOSIS — R200 Anesthesia of skin: Secondary | ICD-10-CM

## 2012-06-02 DIAGNOSIS — G459 Transient cerebral ischemic attack, unspecified: Secondary | ICD-10-CM

## 2012-06-02 NOTE — Telephone Encounter (Signed)
Caller: Regina Gonzalez/Patient; Phone: [phone]; Reason for Call: Pt states she was seen at [facility] ER yesterday for "mini-stroke".  Pt states she was told to follow up with Dr Beverely Low today.  Pt states she does not need an appt (she doesnt think) but was told to call the office today.  Pt does not want to come in for anymore appts. (RN did not transfer call to the office because pt did not want to scheduled ER follow up appt).   Pt is also concerned about who she should follow up with a neurologist (pt was instructed to yesterday in the ED but there is not a referral on her discharge paperwork).  OFFICE PLEASE FOLLOW UP WITH PT IF APPT IS NEEDED FOR ER FOLLOW UP AND REGARDING NEUROLOGY REFERRAL

## 2012-06-02 NOTE — Telephone Encounter (Signed)
Called pt no answer on her phone.

## 2012-06-02 NOTE — Telephone Encounter (Signed)
Referral order placed per Dr. Beverely Low

## 2012-06-02 NOTE — Telephone Encounter (Signed)
Pt notified. Is going to call [facility]. And schedule. Will stay home from any unnecessary travel.

## 2012-06-02 NOTE — Telephone Encounter (Signed)
Called pt still no answer, could not leave a message.

## 2012-06-02 NOTE — Telephone Encounter (Signed)
Pt clearly needs an appt but if she's not willing to schedule it at this time, there is nothing we can do.  Also, people w/ TIAs usually see a neurologist but again, if she doesn't want to schedule any appts, there's no sense in wasting time in making the referral

## 2012-06-04 ENCOUNTER — Telehealth: Payer: Self-pay | Admitting: Family Medicine

## 2012-06-04 NOTE — Telephone Encounter (Signed)
Can you pleas advise what these drops were for?

## 2012-06-04 NOTE — Telephone Encounter (Signed)
Caller: Brice/Patient; Phone: [phone]; Reason for Call: Pt calling today 06/04/12 regarding wants Dr.  Beverely Low to tell her why she needs the eye drops (Tetracaine) that were prescribed by the ED doctor on 06/01/12.  She was there because she had a mini stroke.  She said the eye drops make her eyes burn and she does not want to use them if she does not need them.  Said she can't remember much about that day and does not know why they prescribed eye drops.  Pt refused appt to come in and talk with MD.  [place] Spaniel she has no insurance.  Just wants to know if she really needs to keep using these drops.  PLEASE CALL PT BACK AT [phone] TO ADVISE.  Thanks.

## 2012-06-04 NOTE — Telephone Encounter (Signed)
Called pt back at [phone] (H) as requested pt line has been busy. Will call again later. JLT

## 2012-06-04 NOTE — Telephone Encounter (Signed)
Unclear as to why pt was given eye drops but this is a numbing medication and she went to ER w/ pain behind L eye.  If they are not helping w/ pain, there is no need to use them

## 2012-06-04 NOTE — Telephone Encounter (Signed)
Pt notified and advised that if any symptoms recur such as numbness,drooping etc then she needs to be seen again.

## 2012-06-07 ENCOUNTER — Encounter: Payer: Self-pay | Admitting: Family Medicine

## 2012-06-21 ENCOUNTER — Telehealth: Payer: Self-pay | Admitting: General Practice

## 2012-06-21 MED ORDER — ALPRAZOLAM 0.5 MG PO TABS: 0.5000 mg | ORAL_TABLET | Freq: Three times a day (TID) | ORAL | Status: DC | PRN

## 2012-06-21 NOTE — Telephone Encounter (Signed)
Pt last seen on 10-20-2011. Alprazolam last filled on 05-19-12 #60 with 0 refills. Ok to fill?

## 2012-06-21 NOTE — Telephone Encounter (Signed)
Med filled.  

## 2012-06-21 NOTE — Telephone Encounter (Signed)
Ok for #60, no refills 

## 2012-06-28 ENCOUNTER — Encounter: Payer: Self-pay | Admitting: Family Medicine

## 2012-07-02 ENCOUNTER — Encounter: Payer: Self-pay | Admitting: Lab

## 2012-07-05 ENCOUNTER — Encounter: Payer: Self-pay | Admitting: Neurology

## 2012-07-05 ENCOUNTER — Ambulatory Visit (INDEPENDENT_AMBULATORY_CARE_PROVIDER_SITE_OTHER): Payer: Medicare Other | Admitting: Neurology

## 2012-07-05 ENCOUNTER — Encounter: Payer: Self-pay | Admitting: Family Medicine

## 2012-07-05 ENCOUNTER — Ambulatory Visit (INDEPENDENT_AMBULATORY_CARE_PROVIDER_SITE_OTHER): Payer: Medicare Other | Admitting: Family Medicine

## 2012-07-05 VITALS — BP 140/60 | HR 61 | Temp 97.9°F | Ht 62.25 in | Wt 115.4 lb

## 2012-07-05 VITALS — BP 134/61 | HR 67 | Ht 61.0 in | Wt 115.0 lb

## 2012-07-05 DIAGNOSIS — M810 Age-related osteoporosis without current pathological fracture: Secondary | ICD-10-CM | POA: Diagnosis not present

## 2012-07-05 DIAGNOSIS — Z0000 Encounter for general adult medical examination without abnormal findings: Secondary | ICD-10-CM

## 2012-07-05 DIAGNOSIS — G459 Transient cerebral ischemic attack, unspecified: Secondary | ICD-10-CM | POA: Diagnosis not present

## 2012-07-05 DIAGNOSIS — E785 Hyperlipidemia, unspecified: Secondary | ICD-10-CM | POA: Diagnosis not present

## 2012-07-05 DIAGNOSIS — Z1231 Encounter for screening mammogram for malignant neoplasm of breast: Secondary | ICD-10-CM

## 2012-07-05 DIAGNOSIS — F329 Major depressive disorder, single episode, unspecified: Secondary | ICD-10-CM | POA: Diagnosis not present

## 2012-07-05 LAB — TSH: TSH: 1.78 u[IU]/mL (ref 0.35–5.50)

## 2012-07-05 LAB — LIPID PANEL
Cholesterol: 199 mg/dL (ref 0–200)
HDL: 83.6 mg/dL (ref >39.00)
LDL Cholesterol: 107 mg/dL — ABNORMAL HIGH (ref 0–99)
Total CHOL/HDL Ratio: 2
Triglycerides: 43 mg/dL (ref 0.0–149.0)
VLDL: 8.6 mg/dL (ref 0.0–40.0)

## 2012-07-05 MED ORDER — OMEPRAZOLE 20 MG PO CPDR: 20.0000 mg | DELAYED_RELEASE_CAPSULE | Freq: Every day | ORAL | Status: DC

## 2012-07-05 NOTE — Patient Instructions (Signed)
Follow up in 6 months to recheck cholesterol We'll notify you of your lab results and make any changes if needed We'll call you with your mammo and bone density appts Call your GI doctor and schedule your endoscopy Try and get regular exercise as a stress reliever Call with any questions or concerns Hang in there!

## 2012-07-05 NOTE — Progress Notes (Signed)
Reason for visit: Possible TIA  Regina Gonzalez is a 67 y.o. female  History of present illness:  Regina Gonzalez is a 67 year old right-handed white female with a history of an event that occurred around 05/31/2012. The patient had several episodes of sharp jabbing pain in the corner of the mouth on the left associated with some mild numb and tingly sensations. The patient had a headache behind the left eye projecting into the back of the head. The patient had recurrence of symptoms the following day, and she went to the emergency room. The patient felt somewhat wiped out following these episodes. The patient was unable to work for about 1 week. The patient underwent MRI evaluation of the brain, and MRA of the head and neck. The MRA evaluations were unremarkable, and the MRI of the head shows very minimal small vessel disease. The patient has not had a 2-D echocardiogram. The patient was placed on low-dose aspirin. The patient has not had any recurrence of similar events. The patient denies any balance issues, or problems controlling the bowels or the bladder. The patient did not have any visual field disturbances or double vision.  Past Medical History  Diagnosis Date  . Allergy   . Anxiety   . Arthritis   . COPD (chronic obstructive pulmonary disease)   . Asthma   . Blood transfusion   . Depression   . GERD (gastroesophageal reflux disease)     Barrett's  . Hyperlipidemia   . Cyst of pancreas     benign  . Liver cyst     benign  . Kidney cysts     benign  . Osteoporosis   . TIA (transient ischemic attack)     Past Surgical History  Procedure Laterality Date  . Appendectomy    . Breast,biopsies,bilateral      simple cysts  . Colonoscopy    . Upper gastrointestinal endoscopy    . Ovarian cyst removal      Family History  Problem Relation Age of Onset  . Colon cancer Neg Hx   . Alcoholism Father   . Diabetes type II Brother   . Heart attack Brother   . Multiple  sclerosis Daughter     Social history:  reports that she has quit smoking. She has never used smokeless tobacco. She reports that she does not drink alcohol or use illicit drugs.  Medications:  Current Outpatient Prescriptions on File Prior to Visit  Medication Sig Dispense Refill  . ALPRAZolam (XANAX) 0.5 MG tablet Take 1-2 tablets (0.5-1 mg total) by mouth 3 (three) times daily as needed for anxiety.  60 tablet  0  . calcium citrate (CALCITRATE - DOSED IN MG ELEMENTAL CALCIUM) 950 MG tablet Take 1 tablet by mouth 3 (three) times a week. Alternates with Multi-vitamin      . citalopram (CELEXA) 20 MG tablet Take 30 mg by mouth daily.      . fish oil-omega-3 fatty acids 1000 MG capsule Take 1 g by mouth See admin instructions. Five times a week      . Multiple Vitamins-Minerals (MULTIVITAMIN WITH MINERALS) tablet Take 1 tablet by mouth 4 (four) times a week. Alternates with Caltrate      . omeprazole (PRILOSEC) 20 MG capsule Take 1 capsule (20 mg total) by mouth daily.  30 capsule  6  . simvastatin (ZOCOR) 20 MG tablet Take 10 mg by mouth every evening.       No current facility-administered medications on file prior  to visit.    Allergies:  Allergies  Allergen Reactions  . Erythromycin Other (See Comments)    Allergic to all mycin drugs gets severe stomach cramps - ALL MYCINS  . Metronidazole     ROS:  Out of a complete 14 system review of symptoms, the patient complains only of the following symptoms, and all other reviewed systems are negative.  Memory problems Headache, numbness, weakness  Blood pressure 134/61, pulse 67, height 5\' 1"  (1.549 m), weight 115 lb (52.164 kg), last menstrual period 06/16/1997.  Physical Exam  General: The patient is alert and cooperative at the time of the examination.  Head: Pupils are equal, round, and reactive to light. Discs are flat bilaterally.  Neck: The neck is supple, no carotid bruits are noted.  Respiratory: The respiratory  examination is clear.  Cardiovascular: The cardiovascular examination reveals a regular rate and rhythm, no obvious murmurs or rubs are noted.  Skin: Extremities are without significant edema.  Neurologic Exam  Mental status:  Cranial nerves: Facial symmetry is present. There is good sensation of the face to pinprick and soft touch bilaterally. The strength of the facial muscles and the muscles to head turning and shoulder shrug are normal bilaterally. Speech is well enunciated, no aphasia or dysarthria is noted. Extraocular movements are full. Visual fields are full.  Motor: The motor testing reveals 5 over 5 strength of all 4 extremities. Good symmetric motor tone is noted throughout.  Sensory: Sensory testing is intact to pinprick, soft touch, vibration sensation, and position sense on all 4 extremities. No evidence of extinction is noted.  Coordination: Cerebellar testing reveals good finger-nose-finger and heel-to-shin bilaterally.  Gait and station: Gait is normal. Tandem gait is normal. Romberg is negative. No drift is seen.  Reflexes: Deep tendon reflexes are symmetric and normal bilaterally. Toes are downgoing bilaterally.   Assessment/Plan:  1. Possible TIA  2. Hypertension  The patient has had a stroke workup that has included MRA of the head and neck, and a MRI the brain. These studies were relatively unremarkable. It is not clear that the episodes that she experienced actually represented a TIA. They may have been a form of a left trigeminal neuropathy or neuralgia. At any rate, the patient not had any further symptoms. A 2D echocardiogram will be done. If this is unremarkable, no further workup is indicated. The patient will remain on low-dose aspirin.  Marlan Palau MD 07/05/2012 5:36 PM  [facility] [address] [place], [place] [address]  Phone [phone] Fax [phone]

## 2012-07-05 NOTE — Progress Notes (Signed)
  Subjective:    Patient ID: Regina Gonzalez, female    DOB: Jun 06, 1945, 67 y.o.   MRN: [id number]  HPI Here today for CPE.  Risk Factors: Hyperlipidemia- chronic problem, on Simvastatin.  Due for labs.  Denies abd pain, N/V, myalgias Depression- chronic problem, on Celexa and xanax prn.  Still having difficulties w/ daughter which is straining her relationship w/ grandson.  Pt reports house is 'sad and lonely'- mother passed away and daughter has taken grandson away.  Husband is an alcoholic- this is also causing problems. TIA- pt presented to ER 4/29 w/ sxs of TIA.  MRI/MRA and CT were normal.  No evidence of MI.  Goal now is risk reduction.  Pt was advised to f/u w/ neuro- has appt this afternoon. Physical Activity:  Pt walking regularly Fall Risk: low risk Depression: chronic problem, see above. Hearing: normal to conversational tones and whispered voice at 6 ft ADL's: independent Cognitive: normal linear thought process, memory and attention intact Home Safety: lives w/ alcoholic husband, reports feeling safe Height, Weight, BMI, Visual Acuity: see vitals, vision corrected to 20/20 w/ glasses Counseling: UTD on colonoscopy, pap.  due for mammo and DEXA. Labs Ordered: See A&P Care Plan: See A&P    Review of Systems Patient reports no vision/ hearing changes, adenopathy,fever, weight change,  persistant/recurrent hoarseness , swallowing issues, chest pain, palpitations, edema, persistant/recurrent cough, hemoptysis, dyspnea (rest/exertional/paroxysmal nocturnal), gastrointestinal bleeding (melena, rectal bleeding), abdominal pain, significant heartburn, bowel changes, GU symptoms (dysuria, hematuria, incontinence), Gyn symptoms (abnormal  bleeding, pain),  syncope, memory loss, skin/hair/nail changes, abnormal bruising or bleeding.     Objective:   Physical Exam General Appearance:    Alert, cooperative, no distress, appears stated age  Head:    Normocephalic, without obvious  abnormality, atraumatic  Eyes:    PERRL, conjunctiva/corneas clear, EOM's intact, fundi    benign, both eyes  Ears:    Normal TM's and external ear canals, both ears  Nose:   Nares normal, septum midline, mucosa normal, no drainage    or sinus tenderness  Throat:   Lips, mucosa, and tongue normal; teeth and gums normal  Neck:   Supple, symmetrical, trachea midline, no adenopathy;    Thyroid: no enlargement/tenderness/nodules  Back:     Symmetric, no curvature, ROM normal, no CVA tenderness  Lungs:     Clear to auscultation bilaterally, respirations unlabored  Chest Wall:    No tenderness or deformity   Heart:    Regular rate and rhythm, S1 and S2 normal, no murmur, rub   or gallop  Breast Exam:    Deferred to mammo  Abdomen:     Soft, non-tender, bowel sounds active all four quadrants,    no masses, no organomegaly  Genitalia:    Deferred  Rectal:    Extremities:   Extremities normal, atraumatic, no cyanosis or edema  Pulses:   2+ and symmetric all extremities  Skin:   Skin color, texture, turgor normal, no rashes or lesions  Lymph nodes:   Cervical, supraclavicular, and axillary nodes normal  Neurologic:   CNII-XII intact, normal strength, sensation and reflexes    throughout          Assessment & Plan:

## 2012-07-06 NOTE — Assessment & Plan Note (Signed)
New.  Unclear if pt actually had TIA- w/u was WNL.  Now on low dose ASA.  Has f/u w/ neuro this afternoon.  Currently asymptomatic.  Goal will be risk reduction.  Will follow.

## 2012-07-06 NOTE — Assessment & Plan Note (Signed)
Ongoing issue.  Pt in bad situation w/ husband and daughter but currently feels safe and not fearful.  Continue current meds.  Will follow.

## 2012-07-06 NOTE — Assessment & Plan Note (Signed)
By pt report.  Due for repeat DEXA.  Check Vit D

## 2012-07-06 NOTE — Assessment & Plan Note (Signed)
Chronic problem.  Tolerating statin w/out difficulty.  Check labs.  Adjust meds prn  

## 2012-07-06 NOTE — Assessment & Plan Note (Signed)
Pt's PE WNL.  Due for mammo and DEXA.  Referral made.  Check labs that were not done at ER.  Anticipatory guidance provided.

## 2012-07-07 ENCOUNTER — Encounter: Payer: Self-pay | Admitting: Family Medicine

## 2012-07-07 DIAGNOSIS — Z79899 Other long term (current) drug therapy: Secondary | ICD-10-CM | POA: Diagnosis not present

## 2012-07-08 MED ORDER — ALPRAZOLAM 0.5 MG PO TABS: 0.5000 mg | ORAL_TABLET | Freq: Three times a day (TID) | ORAL | Status: DC | PRN

## 2012-07-08 NOTE — Telephone Encounter (Signed)
Alprazolam called in on 07-08-12. JLT

## 2012-07-10 LAB — VITAMIN D 1,25 DIHYDROXY
Vitamin D 1, 25 (OH)2 Total: 43 pg/mL (ref 18–72)
Vitamin D2 1, 25 (OH)2: <8 pg/mL
Vitamin D3 1, 25 (OH)2: 43 pg/mL

## 2012-07-16 ENCOUNTER — Encounter: Payer: Self-pay | Admitting: Family Medicine

## 2012-07-20 ENCOUNTER — Encounter: Payer: Self-pay | Admitting: Family Medicine

## 2012-07-21 ENCOUNTER — Telehealth: Payer: Self-pay | Admitting: Family Medicine

## 2012-07-21 ENCOUNTER — Encounter: Payer: Self-pay | Admitting: Family Medicine

## 2012-07-21 ENCOUNTER — Ambulatory Visit (INDEPENDENT_AMBULATORY_CARE_PROVIDER_SITE_OTHER): Payer: Medicare Other | Admitting: Family Medicine

## 2012-07-21 VITALS — BP 122/60 | HR 68 | Temp 97.7°F | Wt 116.2 lb

## 2012-07-21 DIAGNOSIS — S0990XA Unspecified injury of head, initial encounter: Secondary | ICD-10-CM | POA: Diagnosis not present

## 2012-07-21 NOTE — Assessment & Plan Note (Signed)
No LOC Wound cleaned well ---no need for stiches/ staples Keep dry24 hours Pt given verbal and written instructions---if and inc symptoms call office or go to ER

## 2012-07-21 NOTE — Telephone Encounter (Signed)
Patient Information:  Caller Name: Lekisha  Phone: [phone]  Patient: Regina Gonzalez, Regina Gonzalez  Gender: Female  DOB: 14-May-1945  Age: 67 Years  PCP: Sheliah Hatch.  Office Follow Up:  Does the office need to follow up with this patient?: No  Instructions For The Office: N/A  RN Note:  Spoke with Dr. Beverely Low regarding ED vs office visit. She instructed that it is ok for the patient to wait and come to the office as long as bleeding is under control. Appointment scheduled for patient in office at 11:15am.  Symptoms  Reason For Call & Symptoms: Patient hit the top of her head on the freezer door this morning when bending over to get something out of her refridgerator. Reports approximately 2 inches long laceration to her scalp. Reports mild pain, mild bleeding.  Reviewed Health History In EMR: Yes  Reviewed Medications In EMR: Yes  Reviewed Allergies In EMR: Yes  Reviewed Surgeries / Procedures: Yes  Date of Onset of Symptoms: 07/21/2012  Guideline(s) Used:  Head Injury  Disposition Per Guideline:   Go to ED Now (or to Office with PCP Approval)  Reason For Disposition Reached:   Skin is split open or gaping (length > 1/2 inch or 12 mm)  Advice Given:  N/A  Patient Will Follow Care Advice:  YES  Appointment Scheduled:  07/21/2012 11:15:00 Appointment Scheduled Provider:  Lelon Perla.

## 2012-07-21 NOTE — Patient Instructions (Signed)
Head Injury, Adult °You have had a head injury that does not appear serious at this time. A concussion is a state of changed mental ability, usually from a blow to the head. You should take clear liquids for the rest of the day and then resume your regular diet. You should not take sedatives or alcoholic beverages for as long as directed by your caregiver after discharge. After injuries such as yours, most problems occur within the first 24 hours. °SYMPTOMS °These minor symptoms may be experienced after discharge: °· Memory difficulties. °· Dizziness. °· Headaches. °· Double vision. °· Hearing difficulties. °· Depression. °· Tiredness. °· Weakness. °· Difficulty with concentration. °If you experience any of these problems, you should not be alarmed. A concussion requires a few days for recovery. Many patients with head injuries frequently experience such symptoms. Usually, these problems disappear without medical care. If symptoms last for more than one day, notify your caregiver. See your caregiver sooner if symptoms are becoming worse rather than better. °HOME CARE INSTRUCTIONS  °· During the next 24 hours you must stay with someone who can watch you for the warning signs listed below. °Although it is unlikely that serious side effects will occur, you should be aware of signs and symptoms which may necessitate your return to this location. Side effects may occur up to 7  10 days following the injury. It is important for you to carefully monitor your condition and contact your caregiver or seek immediate medical attention if there is a change in your condition. °SEEK IMMEDIATE MEDICAL CARE IF:  °· There is confusion or drowsiness. °· You can not awaken the injured person. °· There is nausea (feeling sick to your stomach) or continued, forceful vomiting. °· You notice dizziness or unsteadiness which is getting worse, or inability to walk. °· You have convulsions or unconsciousness. °· You experience severe,  persistent headaches not relieved by over-the-counter or prescription medicines for pain. (Do not take aspirin as this impairs clotting abilities). Take other pain medications only as directed. °· You can not use arms or legs normally. °· There is clear or bloody discharge from the nose or ears. °MAKE SURE YOU:  °· Understand these instructions. °· Will watch your condition. °· Will get help right away if you are not doing well or get worse. °Document Released: 01/20/2005 Document Revised: 04/14/2011 Document Reviewed: 12/08/2008 °ExitCare® Patient Information ©2014 [employer]. ° °

## 2012-07-21 NOTE — Progress Notes (Signed)
  Subjective:    Patient ID: ONNA NODAL, female    DOB: [birth year], 67 y.o.   MRN: [id number]  HPI Pt here c/o hitting her head on the freezer door when she went to get something out of the refrigeration and it started bleeding.  She was unable to get bleeding to stop so CAN sent her here.  Pt denies, ha, visual changes, nausea or vomiting.     Review of Systems As above    Objective:   Physical Exam  BP 122/60  Pulse 68  Temp(Src) 97.7 F (36.5 C) (Oral)  Wt 116 lb 3.2 oz (52.708 kg)  BMI 21.97 kg/m2  SpO2 98%  LMP 06/16/1997 General appearance: alert, cooperative, appears stated age and no distress Head: tiny puncture wound on top scalp--L side,  bleeding stopped.  wound cleaned , no fb, no signs of infection, no need for stiches Eyes: conjunctivae/corneas clear. PERRL, EOM's intact. Fundi benign.  Tetanus--- UTD    Assessment & Plan:

## 2012-07-21 NOTE — Telephone Encounter (Signed)
Appointment Scheduled:  07/21/2012 11:15:00  Appointment Scheduled Provider:  Lelon Perla

## 2012-07-22 MED ORDER — BUPROPION HCL ER (XL) 150 MG PO TB24: 150.0000 mg | ORAL_TABLET | Freq: Every day | ORAL | Status: DC

## 2012-07-22 NOTE — Telephone Encounter (Signed)
New rx sent to the pharmacy by e-script.//AB/CMA 

## 2012-07-30 ENCOUNTER — Telehealth: Payer: Self-pay | Admitting: *Deleted

## 2012-07-30 NOTE — Telephone Encounter (Signed)
Last OV 07-21-12, Last refilled 07-08-12 #60

## 2012-08-01 NOTE — Telephone Encounter (Signed)
Ok for #60, no refills 

## 2012-08-02 ENCOUNTER — Other Ambulatory Visit: Payer: Medicare Other

## 2012-08-02 ENCOUNTER — Ambulatory Visit: Payer: Medicare Other

## 2012-08-02 MED ORDER — ALPRAZOLAM 0.5 MG PO TABS: 0.5000 mg | ORAL_TABLET | Freq: Three times a day (TID) | ORAL | Status: DC | PRN

## 2012-08-02 NOTE — Telephone Encounter (Signed)
Rx printed and faxed to the pharmacy.//AB/CMA 

## 2012-08-09 ENCOUNTER — Encounter: Payer: Self-pay | Admitting: Family Medicine

## 2012-08-09 NOTE — Telephone Encounter (Signed)
Patient's MRI/MRA was 06/01/2012. Please advise. GF/RN

## 2012-08-11 ENCOUNTER — Ambulatory Visit
Admission: RE | Admit: 2012-08-11 | Discharge: 2012-08-11 | Disposition: A | Discharge status: Home / Self Care | Payer: Medicare Other | Source: Ambulatory Visit | Attending: Family Medicine | Admitting: Family Medicine

## 2012-08-11 ENCOUNTER — Other Ambulatory Visit (HOSPITAL_COMMUNITY): Payer: Medicare Other

## 2012-08-11 DIAGNOSIS — M899 Disorder of bone, unspecified: Secondary | ICD-10-CM | POA: Diagnosis not present

## 2012-08-11 DIAGNOSIS — Z1231 Encounter for screening mammogram for malignant neoplasm of breast: Secondary | ICD-10-CM

## 2012-08-11 DIAGNOSIS — M949 Disorder of cartilage, unspecified: Secondary | ICD-10-CM | POA: Diagnosis not present

## 2012-08-11 DIAGNOSIS — M810 Age-related osteoporosis without current pathological fracture: Secondary | ICD-10-CM | POA: Diagnosis not present

## 2012-08-12 DIAGNOSIS — K219 Gastro-esophageal reflux disease without esophagitis: Secondary | ICD-10-CM | POA: Diagnosis not present

## 2012-08-12 DIAGNOSIS — R490 Dysphonia: Secondary | ICD-10-CM | POA: Diagnosis not present

## 2012-08-12 DIAGNOSIS — K5900 Constipation, unspecified: Secondary | ICD-10-CM | POA: Diagnosis not present

## 2012-08-12 DIAGNOSIS — K2270 Barrett's esophagus without dysplasia: Secondary | ICD-10-CM | POA: Diagnosis not present

## 2012-08-13 ENCOUNTER — Ambulatory Visit (HOSPITAL_COMMUNITY): Payer: Medicare Other | Attending: Cardiovascular Disease | Admitting: Radiology

## 2012-08-13 DIAGNOSIS — I359 Nonrheumatic aortic valve disorder, unspecified: Secondary | ICD-10-CM | POA: Diagnosis not present

## 2012-08-13 DIAGNOSIS — E785 Hyperlipidemia, unspecified: Secondary | ICD-10-CM | POA: Diagnosis not present

## 2012-08-13 DIAGNOSIS — G459 Transient cerebral ischemic attack, unspecified: Secondary | ICD-10-CM | POA: Insufficient documentation

## 2012-08-13 HISTORY — PX: TRANSTHORACIC ECHOCARDIOGRAM: SHX275

## 2012-08-13 NOTE — Progress Notes (Signed)
Echocardiogram performed.  

## 2012-08-16 ENCOUNTER — Telehealth: Payer: Self-pay | Admitting: Neurology

## 2012-08-16 NOTE — Telephone Encounter (Signed)
I called patient. The 2-D echocardiogram was unremarkable. The patient will remain on low-dose aspirin. The patient is trying to obtain life insurance, and she indicates that she may have a problem doing this. If needed, we can write a letter.

## 2012-08-18 ENCOUNTER — Telehealth: Payer: Self-pay | Admitting: *Deleted

## 2012-08-18 MED ORDER — ALENDRONATE SODIUM 70 MG PO TABS: 70.0000 mg | ORAL_TABLET | ORAL | Status: DC

## 2012-08-18 NOTE — Telephone Encounter (Signed)
Spoke with the pt and informed her of recent BDS results and note.  Pt understood and agreed.  New rx ordered and sent to the pharmacy by e-script.//AB/CMA

## 2012-08-18 NOTE — Telephone Encounter (Signed)
Message copied by Verdie Shire on Wed Aug 18, 2012 11:49 AM ------      Message from: Sheliah Hatch      Created: Thu Aug 12, 2012 11:14 AM       Pt w/ osteoporosis on DEXA.  Start Fosamax 70mg  weekly.  #12, 3 refills      Make sure she is taking Ca 1200 + Vit D 800 daily ------

## 2012-08-23 DIAGNOSIS — H2510 Age-related nuclear cataract, unspecified eye: Secondary | ICD-10-CM | POA: Diagnosis not present

## 2012-08-24 ENCOUNTER — Other Ambulatory Visit: Payer: Self-pay | Admitting: Family Medicine

## 2012-08-24 NOTE — Telephone Encounter (Signed)
Pharmacy is requesting for alprazolam 0.5mg  last ov 07/21/12 last date filled 08/02/12   60 ct Please Advise.

## 2012-08-26 NOTE — Telephone Encounter (Signed)
Last seen 07/05/12 and filled 08/02/12 # 60. Please advise      KP

## 2012-08-26 NOTE — Telephone Encounter (Signed)
Ok for 6 months of Celexa Ok for #60 Alprazolam, 1 refill

## 2012-08-30 ENCOUNTER — Encounter: Payer: Self-pay | Admitting: Family Medicine

## 2012-09-02 ENCOUNTER — Telehealth: Payer: Self-pay | Admitting: *Deleted

## 2012-09-02 MED ORDER — ALPRAZOLAM 0.5 MG PO TABS: 0.5000 mg | ORAL_TABLET | Freq: Three times a day (TID) | ORAL | Status: DC | PRN

## 2012-09-02 NOTE — Telephone Encounter (Signed)
Refill faxed to pharmacy.

## 2012-09-02 NOTE — Telephone Encounter (Signed)
Pharmacy's requesting a refill for Alprazolam 0.5 mg patient has a controlled substance contract on file last ov 07/21/12 last date filled 08/02/12 60 ct. This is a Dr. Laury Axon patient  please advise.

## 2012-09-02 NOTE — Telephone Encounter (Signed)
Low risk UDS 07/2012, okay to refill.

## 2012-09-03 ENCOUNTER — Ambulatory Visit: Payer: Self-pay | Admitting: Family

## 2012-09-03 ENCOUNTER — Telehealth: Payer: Self-pay | Admitting: Family Medicine

## 2012-09-03 NOTE — Telephone Encounter (Signed)
Please make sure script was sent

## 2012-09-03 NOTE — Telephone Encounter (Signed)
Completed.

## 2012-09-03 NOTE — Telephone Encounter (Signed)
Patient Information:  Caller Name: Roux  Phone: [phone]  Patient: Regina Gonzalez, Regina Gonzalez  Gender: Female  DOB: Feb 28, 1945  Age: 67 Years  PCP: Sheliah Hatch.  Office Follow Up:  Does the office need to follow up with this patient?: Yes  Instructions For The Office: FYI- suggest MD review this call.  RN Note:   No appointments remain at [facility] office.  Scheduled for 1015 on 09/03/12 wtih M. Peggyann Juba, NP at [facility] office. Caller decided she had to go to work, would not be able to keep appointment "all the way over there" and would just come by office now to pick up Rx.  Transferred to [facility] office to confirm Rx is ready to print and appointment cancelled for [facility] office.    Symptoms  Reason For Call & Symptoms: Withdrawl symptoms.  Ran out of 0.5 Xanax 09/01/12 due to delays with refills.  Usually takes 2-3 in evening for past 5 years. Reports Rx was called to pharmacy but  has not been picked up.  Called to report withdrawl symptoms including agitation, nervousness, jitters, mild headache, insomnia x 2 nights.  Took 2/3 of one of two 0.5 mg pill she found in her drawer at 0730.  BP 138/67, P 70.  Advised to see MD within 4 hours for new or worsening agitation per Withdrawl Symptoms.  Reviewed Health History In EMR: Yes  Reviewed Medications In EMR: Yes  Reviewed Allergies In EMR: Yes  Reviewed Surgeries / Procedures: Yes  Date of Onset of Symptoms: 09/02/2012  Treatments Tried: partial dose of Xanax  Treatments Tried Worked: No  Guideline(s) Used:  No Protocol Available - Sick Adult  Disposition Per Guideline:   See Within 4 hours   Reason For Disposition Reached:   Nursing judgment  Advice Given:  Call Back If:  You become worse.  Patient Will Follow Care Advice: No

## 2012-09-08 ENCOUNTER — Other Ambulatory Visit: Payer: Self-pay

## 2012-09-16 ENCOUNTER — Encounter: Payer: Self-pay | Admitting: Family Medicine

## 2012-09-16 MED ORDER — PANTOPRAZOLE SODIUM 40 MG PO TBEC: 40.0000 mg | DELAYED_RELEASE_TABLET | Freq: Every day | ORAL | Status: DC

## 2012-09-16 NOTE — Telephone Encounter (Signed)
Please advise.//AB/CMA 

## 2012-09-22 ENCOUNTER — Telehealth: Payer: Self-pay | Admitting: Neurology

## 2012-09-24 ENCOUNTER — Ambulatory Visit (INDEPENDENT_AMBULATORY_CARE_PROVIDER_SITE_OTHER): Payer: Medicare Other | Admitting: Family Medicine

## 2012-09-24 ENCOUNTER — Encounter: Payer: Self-pay | Admitting: Family Medicine

## 2012-09-24 VITALS — BP 120/68 | HR 66 | Temp 98.2°F | Ht 62.0 in | Wt 116.0 lb

## 2012-09-24 DIAGNOSIS — R1013 Epigastric pain: Secondary | ICD-10-CM

## 2012-09-24 LAB — HEPATIC FUNCTION PANEL
ALT: 23 U/L (ref 0–35)
AST: 25 U/L (ref 0–37)
Albumin: 4.1 g/dL (ref 3.5–5.2)
Alkaline Phosphatase: 50 U/L (ref 39–117)
Bilirubin, Direct: 0.1 mg/dL (ref 0.0–0.3)
Total Bilirubin: 0.5 mg/dL (ref 0.3–1.2)
Total Protein: 6.7 g/dL (ref 6.0–8.3)

## 2012-09-24 LAB — CBC WITH DIFFERENTIAL/PLATELET
Basophils Absolute: 0 10*3/uL (ref 0.0–0.1)
Basophils Relative: 0.8 % (ref 0.0–3.0)
Eosinophils Absolute: 0.1 10*3/uL (ref 0.0–0.7)
Eosinophils Relative: 2.5 % (ref 0.0–5.0)
HCT: 38.3 % (ref 36.0–46.0)
Hemoglobin: 13.1 g/dL (ref 12.0–15.0)
Lymphocytes Relative: 39 % (ref 12.0–46.0)
Lymphs Abs: 1.8 10*3/uL (ref 0.7–4.0)
MCHC: 34.2 g/dL (ref 30.0–36.0)
MCV: 91.8 fl (ref 78.0–100.0)
Monocytes Absolute: 0.3 10*3/uL (ref 0.1–1.0)
Monocytes Relative: 7 % (ref 3.0–12.0)
Neutro Abs: 2.3 10*3/uL (ref 1.4–7.7)
Neutrophils Relative %: 50.7 % (ref 43.0–77.0)
Platelets: 209 10*3/uL (ref 150.0–400.0)
RBC: 4.17 Mil/uL (ref 3.87–5.11)
RDW: 13.4 % (ref 11.5–14.6)
WBC: 4.6 10*3/uL (ref 4.5–10.5)

## 2012-09-24 LAB — H. PYLORI ANTIBODY, IGG: H Pylori IgG: POSITIVE

## 2012-09-24 LAB — BASIC METABOLIC PANEL
BUN: 9 mg/dL (ref 6–23)
CO2: 30 mEq/L (ref 19–32)
Calcium: 9.5 mg/dL (ref 8.4–10.5)
Chloride: 99 mEq/L (ref 96–112)
Creatinine, Ser: 0.9 mg/dL (ref 0.4–1.2)
GFR: 71.3 mL/min (ref >60.00)
Glucose, Bld: 75 mg/dL (ref 70–99)
Potassium: 3.5 mEq/L (ref 3.5–5.1)
Sodium: 136 mEq/L (ref 135–145)

## 2012-09-24 LAB — AMYLASE: Amylase: 93 U/L (ref 27–131)

## 2012-09-24 LAB — LIPASE: Lipase: 49 U/L (ref 11.0–59.0)

## 2012-09-24 NOTE — Progress Notes (Signed)
  Subjective:    Patient ID: Regina Gonzalez, female    DOB: 10/19/1945, 67 y.o.   MRN: [id number]  HPI abd pain- having epigastric pain radiating up into chest, particularly at night.  Some RUQ pain.  Switched to protonix a week ago w/ improvement in sxs x3 days and then they returned.  Not eating spicy foods.  Eating late at night and then lying down.  appt w/ GI is in 2 weeks- Dr Dulce Sellar at [place].  Hx of H pylori infxn.   Review of Systems For ROS see HPI     Objective:   Physical Exam  Vitals reviewed. Constitutional: She is oriented to person, place, and time. She appears well-developed and well-nourished. No distress.  Cardiovascular: Normal rate, regular rhythm, normal heart sounds and intact distal pulses.   Pulmonary/Chest: Effort normal and breath sounds normal. No respiratory distress. She has no wheezes. She has no rales.  Abdominal: Soft. Bowel sounds are normal. She exhibits no distension and no mass. There is tenderness (mild epigastric tenderness). There is no rebound and no guarding.  Neurological: She is alert and oriented to person, place, and time.  Skin: Skin is warm and dry.  Psychiatric:  anxious          Assessment & Plan:

## 2012-09-24 NOTE — Telephone Encounter (Signed)
FYI:  Patient left message that a life insurance company may contact doctor for clarification about a diagnosis.  She has applied for a life insurance policy, and there is something in her medical record about a TIA, which doctor said he wasn't sure she had one.  So she wanted to make doctor aware that he may be contacted.

## 2012-09-24 NOTE — Patient Instructions (Addendum)
We'll notify you of your lab results and make any changes if needed Take the Gas-X regularly Increase the Protonix to twice daily Avoid spicy or acidic foods (citrus, tomatoes, etc) Avoid eating before lying down Call with any questions or concerns Hang in there!!!

## 2012-09-24 NOTE — Telephone Encounter (Signed)
Noted. I'll be happy to talk with someone or send a letter.

## 2012-09-26 ENCOUNTER — Encounter: Payer: Self-pay | Admitting: Family Medicine

## 2012-09-27 ENCOUNTER — Other Ambulatory Visit: Payer: Self-pay | Admitting: Family Medicine

## 2012-09-27 ENCOUNTER — Telehealth: Payer: Self-pay | Admitting: *Deleted

## 2012-09-27 ENCOUNTER — Encounter: Payer: Self-pay | Admitting: Family Medicine

## 2012-09-27 MED ORDER — DOXYCYCLINE HYCLATE 100 MG PO TABS: 100.0000 mg | ORAL_TABLET | Freq: Two times a day (BID) | ORAL | Status: DC

## 2012-09-27 NOTE — Assessment & Plan Note (Signed)
Recurrent problem.  Check labs to r/o biliary issue or H pylori infxn.  Reviewed lifestyle modifications.  Double PPI to BID.  Encouraged GI f/u as scheduled.  Will follow.

## 2012-09-27 NOTE — Telephone Encounter (Signed)
Regina Gonzalez called and ask if Regina Gonzalez was suppose call her in a antibiotic during her last office visit.  I have checked on patients med list and I did not see any antibiotics filled for her at this time.  Regina Gonzalez stated that she was very sick and wanted to start taking her medicine right away.  She would like some one to call her back when an antibiotic has been called into [facility] [address] phone # [phone].  Patient can be reached on her cell phone #[phone].  Please Advise AG

## 2012-09-27 NOTE — Telephone Encounter (Signed)
Message copied by Dion Body on Mon Sep 27, 2012  1:39 PM ------      Message from: Sheliah Hatch      Created: Sun Sep 26, 2012 10:12 AM       Pt H pylori +      Needs to start: Bismuth subsalicylate 525mg  TID PLUS Doxycycline 100mg  BID x14 days      Please ask pt what reaction she has to flagyl b/c this is also part of the treatment plan but it's listed as an allergy for her      Needs to f/u as scheduled w/ GI       ------

## 2012-09-27 NOTE — Telephone Encounter (Signed)
Spoke with patient concerning medications. Advised patient that [facility]. Pt also states that the flagyl makes her real sick and that she can not take it. Pt states she will take the antibiotic only and follow up with her GI in 2 weeks. SW, CMA

## 2012-09-27 NOTE — Addendum Note (Signed)
Addended by: Dion Body on: 09/27/2012 01:38 PM   Modules accepted: Orders

## 2012-09-28 ENCOUNTER — Other Ambulatory Visit: Payer: Self-pay | Admitting: General Practice

## 2012-09-28 ENCOUNTER — Encounter: Payer: Self-pay | Admitting: Family Medicine

## 2012-09-28 MED ORDER — SIMVASTATIN 20 MG PO TABS: ORAL_TABLET | ORAL | Status: DC

## 2012-09-28 MED ORDER — ALPRAZOLAM 0.5 MG PO TABS: 0.5000 mg | ORAL_TABLET | Freq: Three times a day (TID) | ORAL | Status: DC | PRN

## 2012-09-28 NOTE — Telephone Encounter (Signed)
Med filled.  

## 2012-09-29 ENCOUNTER — Encounter: Payer: Self-pay | Admitting: Family Medicine

## 2012-10-08 ENCOUNTER — Other Ambulatory Visit: Payer: Self-pay | Admitting: Gastroenterology

## 2012-10-08 DIAGNOSIS — Z1211 Encounter for screening for malignant neoplasm of colon: Secondary | ICD-10-CM | POA: Diagnosis not present

## 2012-10-08 DIAGNOSIS — R1310 Dysphagia, unspecified: Secondary | ICD-10-CM

## 2012-10-08 DIAGNOSIS — R109 Unspecified abdominal pain: Secondary | ICD-10-CM | POA: Diagnosis not present

## 2012-10-08 DIAGNOSIS — K219 Gastro-esophageal reflux disease without esophagitis: Secondary | ICD-10-CM | POA: Diagnosis not present

## 2012-10-11 ENCOUNTER — Encounter: Payer: Self-pay | Admitting: Family Medicine

## 2012-10-13 ENCOUNTER — Ambulatory Visit
Admission: RE | Admit: 2012-10-13 | Discharge: 2012-10-13 | Disposition: A | Discharge status: Home / Self Care | Payer: Medicare Other | Source: Ambulatory Visit | Attending: Gastroenterology | Admitting: Gastroenterology

## 2012-10-13 DIAGNOSIS — R1310 Dysphagia, unspecified: Secondary | ICD-10-CM

## 2012-10-20 ENCOUNTER — Other Ambulatory Visit: Payer: Self-pay | Admitting: General Practice

## 2012-10-20 MED ORDER — CITALOPRAM HYDROBROMIDE 20 MG PO TABS: ORAL_TABLET | ORAL | Status: DC

## 2012-10-26 ENCOUNTER — Other Ambulatory Visit: Payer: Self-pay | Admitting: Family Medicine

## 2012-10-28 ENCOUNTER — Other Ambulatory Visit: Payer: Self-pay | Admitting: Internal Medicine

## 2012-10-28 NOTE — Telephone Encounter (Signed)
rx refill- Xanax  Last OV- 09/24/12 w/ Dr.Tabori  Last refilled - 09/28/12 #60 / 0 rf  UDS low risk - 07/05/12   Please advise. DJR

## 2012-11-01 ENCOUNTER — Encounter: Payer: Self-pay | Admitting: Family Medicine

## 2012-11-01 ENCOUNTER — Ambulatory Visit (INDEPENDENT_AMBULATORY_CARE_PROVIDER_SITE_OTHER): Payer: Medicare Other | Admitting: Family Medicine

## 2012-11-01 VITALS — BP 122/76 | HR 65 | Temp 98.5°F | Wt 117.2 lb

## 2012-11-01 DIAGNOSIS — F329 Major depressive disorder, single episode, unspecified: Secondary | ICD-10-CM | POA: Diagnosis not present

## 2012-11-01 DIAGNOSIS — Z23 Encounter for immunization: Secondary | ICD-10-CM

## 2012-11-01 DIAGNOSIS — IMO0001 Reserved for inherently not codable concepts without codable children: Secondary | ICD-10-CM

## 2012-11-01 DIAGNOSIS — R030 Elevated blood-pressure reading, without diagnosis of hypertension: Secondary | ICD-10-CM | POA: Diagnosis not present

## 2012-11-01 MED ORDER — ALPRAZOLAM 0.5 MG PO TABS: 0.5000 mg | ORAL_TABLET | Freq: Three times a day (TID) | ORAL | Status: DC | PRN

## 2012-11-01 NOTE — Assessment & Plan Note (Signed)
Deteriorated.  Refill on xanax provided to improve anxiety.  Will follow.

## 2012-11-01 NOTE — Assessment & Plan Note (Signed)
New.  Pt's BP is normal in office today.  Suspect pt's BP is elevated due to pt's stress of recent family situation.  No meds at this time but pt to monitor BP closely and find stress outlet.  Will follow.

## 2012-11-01 NOTE — Progress Notes (Signed)
  Subjective:    Patient ID: Regina Gonzalez, female    DOB: 1945-06-03, 67 y.o.   MRN: [id number]  HPI Elevated BP- pt reports sxs x1 week.  Reading was 156/90 last night after reading letter daughter wrote her.  Her daughter has been arrested, spent time in jail, and has left pt custody of grandson.  Pt took BP w/ wrist cuff.  + HA when BP was high, facial flushing.  Denies CP, SOB, visual changes, edema.  Due for refill on xanax.   Review of Systems For ROS see HPI     Objective:   Physical Exam  Vitals reviewed. Constitutional: She is oriented to person, place, and time. She appears well-developed and well-nourished. No distress.  HENT:  Head: Normocephalic and atraumatic.  Eyes: Conjunctivae and EOM are normal. Pupils are equal, round, and reactive to light.  Neck: Normal range of motion. Neck supple. No thyromegaly present.  Cardiovascular: Normal rate, regular rhythm, normal heart sounds and intact distal pulses.   No murmur heard. Pulmonary/Chest: Effort normal and breath sounds normal. No respiratory distress.  Abdominal: Soft. She exhibits no distension. There is no tenderness.  Musculoskeletal: She exhibits no edema.  Lymphadenopathy:    She has no cervical adenopathy.  Neurological: She is alert and oriented to person, place, and time.  Skin: Skin is warm and dry.  Psychiatric: She has a normal mood and affect. Her behavior is normal.          Assessment & Plan:

## 2012-11-01 NOTE — Patient Instructions (Addendum)
Continue to monitor your BP- if it's consistently higher than 140/90, call me Try and find a stress outlet- you need it! Call with any questions or concerns Hang in there!!

## 2012-11-03 ENCOUNTER — Other Ambulatory Visit: Payer: Self-pay | Admitting: *Deleted

## 2012-11-08 ENCOUNTER — Telehealth: Payer: Self-pay | Admitting: Family Medicine

## 2012-11-08 DIAGNOSIS — M19049 Primary osteoarthritis, unspecified hand: Secondary | ICD-10-CM

## 2012-11-08 NOTE — Telephone Encounter (Signed)
Patient is calling to request a referral to be seen for management of her arthritis. States that she discussed her arthritis issues with Dr. Beverely Low at her last OV.

## 2012-11-08 NOTE — Telephone Encounter (Signed)
Pt states that she has nodules on her hands and experiences sever discomfort and difficulty performing tasks. Referral placed.

## 2012-11-08 NOTE — Telephone Encounter (Signed)
Not sure what pt is referring to- she needs to be a little more specific.  We discussed BP and depression at last OV (at length about her family situation) but I don't remember discussing arthritis

## 2012-12-07 ENCOUNTER — Other Ambulatory Visit: Payer: Self-pay | Admitting: Family Medicine

## 2012-12-07 NOTE — Telephone Encounter (Signed)
Last OV 9-29 Med filled 9-29 #60 with 1 refill  Low risk

## 2012-12-07 NOTE — Telephone Encounter (Signed)
Med faxed   

## 2012-12-20 ENCOUNTER — Encounter: Payer: Self-pay | Admitting: Family Medicine

## 2012-12-20 ENCOUNTER — Ambulatory Visit (INDEPENDENT_AMBULATORY_CARE_PROVIDER_SITE_OTHER): Payer: Medicare Other | Admitting: Family Medicine

## 2012-12-20 VITALS — BP 134/88 | HR 70 | Temp 98.2°F | Resp 16 | Wt 117.1 lb

## 2012-12-20 DIAGNOSIS — J019 Acute sinusitis, unspecified: Secondary | ICD-10-CM | POA: Diagnosis not present

## 2012-12-20 MED ORDER — AMOXICILLIN 875 MG PO TABS: 875.0000 mg | ORAL_TABLET | Freq: Two times a day (BID) | ORAL | Status: DC

## 2012-12-20 NOTE — Progress Notes (Signed)
  Subjective:    Patient ID: Regina Gonzalez, female    DOB: 10/16/1945, 68 y.o.   MRN: [id number]  HPI Pre visit review using our clinic review tool, if applicable. No additional management support is needed unless otherwise documented below in the visit note.  URI- sxs started 1 week ago.  Now w/ some chest tightness, mild SOB.  Minimal cough.  + hx of asthma when sick.  No fevers.  + sick contacts.  Some maxillary sinus pain.  + nasal congestion.  No ear pain.   Review of Systems For ROS see HPI     Objective:   Physical Exam  Vitals reviewed. Constitutional: She appears well-developed and well-nourished. No distress.  HENT:  Head: Normocephalic and atraumatic.  Right Ear: Tympanic membrane normal.  Left Ear: Tympanic membrane normal.  Nose: Mucosal edema and rhinorrhea present. Right sinus exhibits maxillary sinus tenderness. Right sinus exhibits no frontal sinus tenderness. Left sinus exhibits maxillary sinus tenderness. Left sinus exhibits no frontal sinus tenderness.  Mouth/Throat: Uvula is midline and mucous membranes are normal. Posterior oropharyngeal erythema present. No oropharyngeal exudate.  Eyes: Conjunctivae and EOM are normal. Pupils are equal, round, and reactive to light.  Neck: Normal range of motion. Neck supple.  Cardiovascular: Normal rate, regular rhythm and normal heart sounds.   Pulmonary/Chest: Effort normal and breath sounds normal. No respiratory distress. She has no wheezes.  Lymphadenopathy:    She has no cervical adenopathy.          Assessment & Plan:

## 2012-12-20 NOTE — Patient Instructions (Signed)
Follow up as needed Start the Amoxicillin twice daily- take w/ food Drink plenty of fluids Mucinex to break up the chest congestion REST! Call with any questions or concerns Happy Thanksgiving!

## 2012-12-22 NOTE — Assessment & Plan Note (Signed)
Pt's sxs and PE consistent w/ infxn.  Start abx.  Reviewed supportive care and red flags that should prompt return.  Pt expressed understanding and is in agreement w/ plan.  

## 2013-01-01 ENCOUNTER — Other Ambulatory Visit: Payer: Self-pay | Admitting: Family Medicine

## 2013-01-03 NOTE — Telephone Encounter (Signed)
Med filled.  

## 2013-01-06 DIAGNOSIS — K219 Gastro-esophageal reflux disease without esophagitis: Secondary | ICD-10-CM | POA: Diagnosis not present

## 2013-01-06 DIAGNOSIS — R1310 Dysphagia, unspecified: Secondary | ICD-10-CM | POA: Diagnosis not present

## 2013-01-06 DIAGNOSIS — K5900 Constipation, unspecified: Secondary | ICD-10-CM | POA: Diagnosis not present

## 2013-01-08 ENCOUNTER — Other Ambulatory Visit: Payer: Self-pay | Admitting: Family Medicine

## 2013-01-09 ENCOUNTER — Other Ambulatory Visit: Payer: Self-pay | Admitting: Family Medicine

## 2013-01-10 ENCOUNTER — Other Ambulatory Visit: Payer: Self-pay | Admitting: Family Medicine

## 2013-01-10 NOTE — Telephone Encounter (Signed)
Requesting Alprazolam 0.5mg  Last refill:11-30-12 Last OV:11-01-12 UDS:07-05-12-Low Risk-next UDS:01-04-13 Please advise.//AB/CMA

## 2013-01-11 NOTE — Telephone Encounter (Signed)
Rx was phoned-in to [facility]).//AB/CMA

## 2013-01-12 ENCOUNTER — Telehealth: Payer: Self-pay | Admitting: Family Medicine

## 2013-01-12 NOTE — Telephone Encounter (Signed)
Patient called and stated that it was tome to recheck her cholesterol. Please advise me.

## 2013-01-13 NOTE — Telephone Encounter (Signed)
Called patient back and scheduled an appointment for December 24 th at 9:00am thanks.

## 2013-01-13 NOTE — Telephone Encounter (Signed)
Pt needs to schedule an office visit for cholesterol f/u.

## 2013-01-14 ENCOUNTER — Other Ambulatory Visit: Payer: Self-pay | Admitting: General Practice

## 2013-01-14 MED ORDER — SIMVASTATIN 20 MG PO TABS: ORAL_TABLET | ORAL | Status: DC

## 2013-01-16 ENCOUNTER — Encounter: Payer: Self-pay | Admitting: Family Medicine

## 2013-01-17 ENCOUNTER — Ambulatory Visit: Payer: Medicare Other | Admitting: Family Medicine

## 2013-01-17 ENCOUNTER — Other Ambulatory Visit: Payer: Medicare Other

## 2013-01-24 DIAGNOSIS — Z79899 Other long term (current) drug therapy: Secondary | ICD-10-CM | POA: Diagnosis not present

## 2013-01-24 DIAGNOSIS — M19049 Primary osteoarthritis, unspecified hand: Secondary | ICD-10-CM | POA: Diagnosis not present

## 2013-01-24 DIAGNOSIS — M6530 Trigger finger, unspecified finger: Secondary | ICD-10-CM | POA: Diagnosis not present

## 2013-01-24 DIAGNOSIS — L408 Other psoriasis: Secondary | ICD-10-CM | POA: Diagnosis not present

## 2013-01-24 DIAGNOSIS — M064 Inflammatory polyarthropathy: Secondary | ICD-10-CM | POA: Diagnosis not present

## 2013-01-26 ENCOUNTER — Ambulatory Visit: Payer: Medicare Other | Admitting: Family Medicine

## 2013-02-08 ENCOUNTER — Other Ambulatory Visit: Payer: Self-pay | Admitting: Family Medicine

## 2013-02-09 NOTE — Telephone Encounter (Signed)
Last OV 12-20-12 Med filled 01-08-13 #60 with 0  [facility] on file no UDS

## 2013-02-09 NOTE — Telephone Encounter (Signed)
Med filled and faxed.  

## 2013-02-24 ENCOUNTER — Other Ambulatory Visit: Payer: Self-pay | Admitting: Family Medicine

## 2013-02-24 NOTE — Telephone Encounter (Signed)
Med filled.  

## 2013-02-28 ENCOUNTER — Other Ambulatory Visit: Payer: Self-pay | Admitting: Gastroenterology

## 2013-02-28 DIAGNOSIS — R109 Unspecified abdominal pain: Secondary | ICD-10-CM

## 2013-03-02 ENCOUNTER — Ambulatory Visit
Admission: RE | Admit: 2013-03-02 | Discharge: 2013-03-02 | Disposition: A | Discharge status: Home / Self Care | Payer: Medicare Other | Source: Ambulatory Visit | Attending: Gastroenterology | Admitting: Gastroenterology

## 2013-03-02 DIAGNOSIS — K862 Cyst of pancreas: Secondary | ICD-10-CM | POA: Diagnosis not present

## 2013-03-02 DIAGNOSIS — N281 Cyst of kidney, acquired: Secondary | ICD-10-CM | POA: Diagnosis not present

## 2013-03-02 DIAGNOSIS — R109 Unspecified abdominal pain: Secondary | ICD-10-CM

## 2013-03-02 DIAGNOSIS — K863 Pseudocyst of pancreas: Secondary | ICD-10-CM | POA: Diagnosis not present

## 2013-03-04 DIAGNOSIS — R1310 Dysphagia, unspecified: Secondary | ICD-10-CM | POA: Diagnosis not present

## 2013-03-07 DIAGNOSIS — Z79899 Other long term (current) drug therapy: Secondary | ICD-10-CM | POA: Diagnosis not present

## 2013-03-07 DIAGNOSIS — M6530 Trigger finger, unspecified finger: Secondary | ICD-10-CM | POA: Diagnosis not present

## 2013-03-07 DIAGNOSIS — M19049 Primary osteoarthritis, unspecified hand: Secondary | ICD-10-CM | POA: Diagnosis not present

## 2013-03-07 DIAGNOSIS — M064 Inflammatory polyarthropathy: Secondary | ICD-10-CM | POA: Diagnosis not present

## 2013-03-17 ENCOUNTER — Ambulatory Visit (INDEPENDENT_AMBULATORY_CARE_PROVIDER_SITE_OTHER): Payer: Medicare Other | Admitting: Family Medicine

## 2013-03-17 ENCOUNTER — Encounter: Payer: Self-pay | Admitting: Family Medicine

## 2013-03-17 VITALS — BP 120/62 | HR 68 | Temp 98.2°F | Resp 16 | Wt 114.5 lb

## 2013-03-17 DIAGNOSIS — J019 Acute sinusitis, unspecified: Secondary | ICD-10-CM

## 2013-03-17 MED ORDER — PROMETHAZINE-DM 6.25-15 MG/5ML PO SYRP: 5.0000 mL | ORAL_SOLUTION | Freq: Four times a day (QID) | ORAL | Status: DC | PRN

## 2013-03-17 MED ORDER — AMOXICILLIN 875 MG PO TABS: 875.0000 mg | ORAL_TABLET | Freq: Two times a day (BID) | ORAL | Status: DC

## 2013-03-17 NOTE — Progress Notes (Signed)
   Subjective:    Patient ID: Regina Gonzalez, female    DOB: 1945-11-04, 68 y.o.   MRN: [id number]  Cough   URI- sxs started Saturday w/ 'bad cold'.  Yesterday had excessive fatigue.  + body aches.  + chest tightness.  Cough is dry.  No fevers.  + sinus pressure.  Bilateral ear fullness.  No known sick contacts.  No N/V/D.   Review of Systems  Respiratory: Positive for cough.    For ROS see HPI     Objective:   Physical Exam  Vitals reviewed. Constitutional: She appears well-developed and well-nourished. No distress.  HENT:  Head: Normocephalic and atraumatic.  Right Ear: Tympanic membrane normal.  Left Ear: Tympanic membrane normal.  Nose: Mucosal edema and rhinorrhea present. Right sinus exhibits maxillary sinus tenderness and frontal sinus tenderness. Left sinus exhibits maxillary sinus tenderness and frontal sinus tenderness.  Mouth/Throat: Uvula is midline and mucous membranes are normal. Posterior oropharyngeal erythema present. No oropharyngeal exudate.  Eyes: Conjunctivae and EOM are normal. Pupils are equal, round, and reactive to light.  Neck: Normal range of motion. Neck supple.  Cardiovascular: Normal rate, regular rhythm and normal heart sounds.   Pulmonary/Chest: Effort normal and breath sounds normal. No respiratory distress. She has no wheezes.  Dry cough  Lymphadenopathy:    She has no cervical adenopathy.          Assessment & Plan:

## 2013-03-17 NOTE — Assessment & Plan Note (Signed)
Pt's sxs and PE consistent w/ infxn.  Start abx.  Cough meds prn.  Reviewed supportive care and red flags that should prompt return.  Pt expressed understanding and is in agreement w/ plan.  

## 2013-03-17 NOTE — Patient Instructions (Signed)
Follow up as needed Start the Amoxicillin twice daily- take w/ food Drink plenty of fluids Use the cough syrup as needed- will cause drowsiness Mucinex DM for daytime cough REST! Hang in there!!!

## 2013-03-17 NOTE — Progress Notes (Signed)
Pre visit review using our clinic review tool, if applicable. No additional management support is needed unless otherwise documented below in the visit note. 

## 2013-04-04 DIAGNOSIS — A048 Other specified bacterial intestinal infections: Secondary | ICD-10-CM | POA: Diagnosis not present

## 2013-04-04 DIAGNOSIS — R109 Unspecified abdominal pain: Secondary | ICD-10-CM | POA: Diagnosis not present

## 2013-04-04 DIAGNOSIS — K219 Gastro-esophageal reflux disease without esophagitis: Secondary | ICD-10-CM | POA: Diagnosis not present

## 2013-04-05 DIAGNOSIS — A048 Other specified bacterial intestinal infections: Secondary | ICD-10-CM | POA: Diagnosis not present

## 2013-04-11 ENCOUNTER — Encounter: Payer: Self-pay | Admitting: Family Medicine

## 2013-04-13 ENCOUNTER — Other Ambulatory Visit: Payer: Self-pay | Admitting: Family Medicine

## 2013-04-13 NOTE — Telephone Encounter (Signed)
Last Ov 03-17-13 (sinus) Med filled 02-08-13

## 2013-04-13 NOTE — Telephone Encounter (Signed)
Med filled and faxed.  

## 2013-04-18 ENCOUNTER — Other Ambulatory Visit (HOSPITAL_COMMUNITY): Payer: Self-pay | Admitting: Gastroenterology

## 2013-04-18 DIAGNOSIS — R109 Unspecified abdominal pain: Secondary | ICD-10-CM

## 2013-04-26 ENCOUNTER — Encounter: Payer: Self-pay | Admitting: Family Medicine

## 2013-04-27 ENCOUNTER — Ambulatory Visit (HOSPITAL_COMMUNITY): Payer: Medicare Other

## 2013-04-29 ENCOUNTER — Encounter: Payer: Self-pay | Admitting: Family Medicine

## 2013-04-29 ENCOUNTER — Ambulatory Visit (INDEPENDENT_AMBULATORY_CARE_PROVIDER_SITE_OTHER): Payer: Medicare Other | Admitting: Family Medicine

## 2013-04-29 VITALS — BP 130/64 | HR 62 | Temp 98.2°F | Resp 16 | Wt 114.4 lb

## 2013-04-29 DIAGNOSIS — K644 Residual hemorrhoidal skin tags: Secondary | ICD-10-CM

## 2013-04-29 MED ORDER — HYDROCORTISONE ACE-PRAMOXINE 2.5-1 % RE CREA: 1.0000 "application " | TOPICAL_CREAM | Freq: Three times a day (TID) | RECTAL | Status: DC

## 2013-04-29 NOTE — Progress Notes (Signed)
Pre visit review using our clinic review tool, if applicable. No additional management support is needed unless otherwise documented below in the visit note. 

## 2013-04-29 NOTE — Progress Notes (Signed)
   Subjective:    Patient ID: Regina Gonzalez, female    DOB: 10-02-1945, 68 y.o.   MRN: [id number]  HPI Bleeding in panties- this Sunday BRB but was unable to determine if this came from vagina or rectum.  No pain.  No vaginal discharge.  No burning w/ urination.  No straining or constipation recently.  Had colonoscopy 3 yrs ago and 'i was ok'.  Known hx of hemorrhoids   Review of Systems For ROS see HPI     Objective:   Physical Exam  Vitals reviewed. Constitutional: She appears well-developed and well-nourished. No distress.  Genitourinary: Rectal exam shows external hemorrhoid (large unroofed external hemorrhoid covering L wall) and tenderness (over external hemorrhoid). Rectal exam shows no internal hemorrhoid and no fissure. There is no rash, tenderness or lesion on the right labia. There is no rash, tenderness or lesion on the left labia. No erythema or bleeding around the vagina. No foreign body around the vagina. No vaginal discharge found.          Assessment & Plan:

## 2013-04-29 NOTE — Patient Instructions (Signed)
Follow up as needed Apply the Analpram 3x/day to improve the swelling and help heal the area If the bleeding continues, please call Dr Hulen Shoutsutlaw's office Call with any questions or concerns Hang in there!

## 2013-05-01 NOTE — Assessment & Plan Note (Signed)
New.  No evidence of vaginal bleeding.  Large, unroofed external hemorrhoid covering L rectal wall.  This is clearly the source of pt's bleeding.  Start Analpram and pt to call GI for ongoing management.  Pt expressed understanding and is in agreement w/ plan.

## 2013-05-26 DIAGNOSIS — K921 Melena: Secondary | ICD-10-CM | POA: Diagnosis not present

## 2013-05-26 DIAGNOSIS — K219 Gastro-esophageal reflux disease without esophagitis: Secondary | ICD-10-CM | POA: Diagnosis not present

## 2013-05-26 DIAGNOSIS — R109 Unspecified abdominal pain: Secondary | ICD-10-CM | POA: Diagnosis not present

## 2013-06-08 ENCOUNTER — Other Ambulatory Visit: Payer: Self-pay | Admitting: Family Medicine

## 2013-06-08 NOTE — Telephone Encounter (Signed)
Med filled.  

## 2013-07-06 ENCOUNTER — Encounter: Payer: Medicare Other | Admitting: Family Medicine

## 2013-07-07 ENCOUNTER — Telehealth: Payer: Self-pay

## 2013-07-07 NOTE — Telephone Encounter (Signed)
Medication and allergies:  Reviewed and updated  90 day supply/mail order: n/a Local pharmacy:  [id number] - [place], [place] - [address]   Immunizations due:  PNA and Zoster   A/P: No changes to personal, family history or past surgical hx PAP- 06/04/10- normal---would like a PAP with this appointment CCS- 05/05/10- external hemorrhoids MMG- 08/11/12- negative BD- 08/11/12- osteoporosis Flu- 11/01/12 Tdap- 11/01/12 PNA- DUE Shingles- DUE  To Discuss with Provider: Scaly place in the middle of her back that she would like to have assessed.   Stopped taking her Simvastatin

## 2013-07-08 ENCOUNTER — Ambulatory Visit (INDEPENDENT_AMBULATORY_CARE_PROVIDER_SITE_OTHER): Payer: Medicare Other | Admitting: Family Medicine

## 2013-07-08 ENCOUNTER — Encounter: Payer: Self-pay | Admitting: Family Medicine

## 2013-07-08 ENCOUNTER — Other Ambulatory Visit: Payer: Self-pay | Admitting: Family Medicine

## 2013-07-08 VITALS — BP 130/82 | HR 65 | Temp 98.2°F | Resp 16 | Ht 62.0 in | Wt 114.1 lb

## 2013-07-08 DIAGNOSIS — R490 Dysphonia: Secondary | ICD-10-CM | POA: Diagnosis not present

## 2013-07-08 DIAGNOSIS — F329 Major depressive disorder, single episode, unspecified: Secondary | ICD-10-CM | POA: Diagnosis not present

## 2013-07-08 DIAGNOSIS — E785 Hyperlipidemia, unspecified: Secondary | ICD-10-CM

## 2013-07-08 DIAGNOSIS — F3289 Other specified depressive episodes: Secondary | ICD-10-CM

## 2013-07-08 DIAGNOSIS — M810 Age-related osteoporosis without current pathological fracture: Secondary | ICD-10-CM

## 2013-07-08 DIAGNOSIS — N950 Postmenopausal bleeding: Secondary | ICD-10-CM | POA: Diagnosis not present

## 2013-07-08 DIAGNOSIS — D709 Neutropenia, unspecified: Secondary | ICD-10-CM

## 2013-07-08 DIAGNOSIS — Z0000 Encounter for general adult medical examination without abnormal findings: Secondary | ICD-10-CM

## 2013-07-08 LAB — BASIC METABOLIC PANEL
BUN: 12 mg/dL (ref 6–23)
CO2: 31 mEq/L (ref 19–32)
Calcium: 9.7 mg/dL (ref 8.4–10.5)
Chloride: 102 mEq/L (ref 96–112)
Creatinine, Ser: 1 mg/dL (ref 0.4–1.2)
GFR: 61.81 mL/min (ref >60.00)
Glucose, Bld: 85 mg/dL (ref 70–99)
Potassium: 3.6 mEq/L (ref 3.5–5.1)
Sodium: 138 mEq/L (ref 135–145)

## 2013-07-08 LAB — HEPATIC FUNCTION PANEL
ALT: 28 U/L (ref 0–35)
AST: 27 U/L (ref 0–37)
Albumin: 4.2 g/dL (ref 3.5–5.2)
Alkaline Phosphatase: 45 U/L (ref 39–117)
Bilirubin, Direct: 0 mg/dL (ref 0.0–0.3)
Total Bilirubin: 0.5 mg/dL (ref 0.2–1.2)
Total Protein: 6.7 g/dL (ref 6.0–8.3)

## 2013-07-08 LAB — CBC WITH DIFFERENTIAL/PLATELET
Basophils Absolute: 0.1 10*3/uL (ref 0.0–0.1)
Basophils Relative: 1.4 % (ref 0.0–3.0)
Eosinophils Absolute: 0.2 10*3/uL (ref 0.0–0.7)
Eosinophils Relative: 4.4 % (ref 0.0–5.0)
HCT: 35.7 % — ABNORMAL LOW (ref 36.0–46.0)
Hemoglobin: 11.7 g/dL — ABNORMAL LOW (ref 12.0–15.0)
Lymphocytes Relative: 48.6 % — ABNORMAL HIGH (ref 12.0–46.0)
Lymphs Abs: 1.7 10*3/uL (ref 0.7–4.0)
MCHC: 32.8 g/dL (ref 30.0–36.0)
MCV: 83.1 fl (ref 78.0–100.0)
Monocytes Absolute: 0.3 10*3/uL (ref 0.1–1.0)
Monocytes Relative: 8.4 % (ref 3.0–12.0)
Neutro Abs: 1.3 10*3/uL — ABNORMAL LOW (ref 1.4–7.7)
Neutrophils Relative %: 37.2 % — ABNORMAL LOW (ref 43.0–77.0)
Platelets: 243 10*3/uL (ref 150.0–400.0)
RBC: 4.3 Mil/uL (ref 3.87–5.11)
RDW: 15.2 % (ref 11.5–15.5)
WBC: 3.6 10*3/uL — ABNORMAL LOW (ref 4.0–10.5)

## 2013-07-08 LAB — LIPID PANEL
Cholesterol: 235 mg/dL — ABNORMAL HIGH (ref 0–200)
HDL: 86.9 mg/dL (ref >39.00)
LDL Cholesterol: 139 mg/dL — ABNORMAL HIGH (ref 0–99)
NonHDL: 148.1
Total CHOL/HDL Ratio: 3
Triglycerides: 47 mg/dL (ref 0.0–149.0)
VLDL: 9.4 mg/dL (ref 0.0–40.0)

## 2013-07-08 LAB — TSH: TSH: 2.53 u[IU]/mL (ref 0.35–4.50)

## 2013-07-08 LAB — VITAMIN D 25 HYDROXY (VIT D DEFICIENCY, FRACTURES): VITD: 56.14 ng/mL

## 2013-07-08 NOTE — Progress Notes (Signed)
   Subjective:    Patient ID: Regina Gonzalez, female    DOB: October 31, 1945, 68 y.o.   MRN: [id number]  HPI Here today for CPE.  Risk Factors: Hyperlipidemia- chronic problem, on Simvastatin.  Denies abd pain, N/V Depression- continues to have severe sxs, having custody issues w/ grandson and daughter.  Going through mediation.  Currently on celexa.  Denies SI/HI.  Not interested in counseling b/c she fears it will complicate the custody situation Osteoporosis- on Ca and Vit D daily, UTD on DEXA.  Due for Vit D level Physical Activity: limited exercise, just started walking. Fall Risk: low risk Depression: see above Hearing: normal to conversational tones and whispered voice at 6 ft ADL's: independent Cognitive: normal linear thought process, memory and attention intact Home Safety: safe at home, lives w/ husband and grandson, husband is an alcoholic Height, Weight, BMI, Visual Acuity: see vitals, vision corrected to 20/20 w/ glasses Counseling: UTD on colonoscopy, DEXA, mammo.  Pt declines pap and is over 13 yrs old Labs Ordered: See A&P Care Plan: See A&P    Review of Systems Patient reports no vision/ hearing changes, adenopathy,fever, weight change, swallowing issues, chest pain, palpitations, edema, persistant/recurrent cough, hemoptysis, dyspnea (rest/exertional/paroxysmal nocturnal), gastrointestinal bleeding (melena, rectal bleeding), abdominal pain, significant heartburn, bowel changes, GU symptoms (dysuria, hematuria, incontinence),  syncope, focal weakness, memory loss, numbness & tingling, skin/hair/nail changes, abnormal bruising or bleeding.  Possible intermittent vaginal spotting- pt reports difficult to determine whether bleeding is vaginal or hemorrhoidal. Chronic hoarseness     Objective:   Physical Exam  General Appearance:    Alert, cooperative, no distress, appears stated age  Head:    Normocephalic, without obvious abnormality, atraumatic  Eyes:    PERRL,  conjunctiva/corneas clear, EOM's intact, fundi    benign, both eyes  Ears:    Normal TM's and external ear canals, both ears  Nose:   Nares normal, septum midline, mucosa normal, no drainage    or sinus tenderness  Throat:   Lips, mucosa, and tongue normal; teeth and gums normal  Neck:   Supple, symmetrical, trachea midline, no adenopathy;    Thyroid: no enlargement/tenderness/nodules  Back:     Symmetric, no curvature, ROM normal, no CVA tenderness  Lungs:     Clear to auscultation bilaterally, respirations unlabored  Chest Wall:    No tenderness or deformity   Heart:    Regular rate and rhythm, S1 and S2 normal, no murmur, rub   or gallop  Breast Exam:    Deferred to mammo at pt's request  Abdomen:     Soft, non-tender, bowel sounds active all four quadrants,    no masses, no organomegaly  Genitalia:    Deferred to upcoming GYN appt  Rectal:    Extremities:   Extremities normal, atraumatic, no cyanosis or edema  Pulses:   2+ and symmetric all extremities  Skin:   Skin color, texture, turgor normal, no rashes or lesions  Lymph nodes:   Cervical, supraclavicular, and axillary nodes normal  Neurologic:   CNII-XII intact, normal strength, sensation and reflexes    throughout          Assessment & Plan:

## 2013-07-08 NOTE — Assessment & Plan Note (Signed)
Chronic problem.  sxs continue to be moderate to severe.  Pt on meds but refusing counseling due to possible impact on custody situation.  Will continue to follow.

## 2013-07-08 NOTE — Patient Instructions (Signed)
Follow up in 6 months to recheck cholesterol We'll notify you of your lab results and make any changes if needed We'll call you with your ENT and GYN appts Call with any questions or concerns Happy Early Birthday!!!

## 2013-07-08 NOTE — Assessment & Plan Note (Signed)
Chronic problem, on Ca and Vit D.  UTD on DEXA.  Check Vit D level and replete prn.

## 2013-07-08 NOTE — Assessment & Plan Note (Signed)
Chronic problem for pt.  Is seeing GI.  Had endoscopy 'it was fine'.  Will refer to ENT for evaluation.  Pt expressed understanding and is in agreement w/ plan.

## 2013-07-08 NOTE — Assessment & Plan Note (Signed)
Pt's PE WNL.  UTD on mammo, DEXA, colonoscopy.  No longer requiring paps but due to possible vaginal bleeding will refer to GYN.  Will follow.

## 2013-07-08 NOTE — Assessment & Plan Note (Signed)
Unclear if pt is actually having vaginal bleeding or hemorrhoidal bleeding (as this was her most recent issue).  Will refer to GYN since pt is postmenopausal and we need to completely r/o endometrial hyperplasia/dysplasia.  No bleeding today.  Exam deferred to GYN.  Will follow.

## 2013-07-08 NOTE — Progress Notes (Signed)
Pre visit review using our clinic review tool, if applicable. No additional management support is needed unless otherwise documented below in the visit note. 

## 2013-07-08 NOTE — Assessment & Plan Note (Signed)
Chronic problem, tolerating statin w/o difficulty.  Check labs.  Adjust meds prn  

## 2013-07-11 ENCOUNTER — Telehealth: Payer: Self-pay | Admitting: Family Medicine

## 2013-07-11 NOTE — Telephone Encounter (Signed)
[name] had questions regarding her hemoglobin level.  She was made aware that her hemoglobin on 07/08/13 was lower than the last time it was checked on 09/24/12.  She was informed that her hemoglobin level was 11.1 and that normal range is 12- 15 g/dl.  She asked if she needed to take anything.  Pt was told, not at this time, but that she could increase the amount of iron rich food in her diet to include things such as red meat, pork, poultry, seafood, beans, dark green leafy vegtables, dried fruit (raisins and apricots), and iron-fortified cereals, breads and pasta as appropriate.  She was also made aware that Dr. Beverely Low recommends that she come back in within 2 weeks for a repeat CBC.  Patient stated understanding and agreed.  A lab appointment was scheduled for 07/29/13 @ 8:15 am.     [name] also asked that it be noted that her cholesterol was elevated because she stopped taking her cholesterol medication x 3 months, but since receiving lab results, she has restarted her medication and has increased her level of activity.  She is also working on her diet.

## 2013-07-11 NOTE — Telephone Encounter (Signed)
Left message for call back.

## 2013-07-11 NOTE — Telephone Encounter (Signed)
Patient has questions regarding her hemoglobin level. She would like to know if Dr. Beverely Low thinks she is anemic. She states she used to be anemic when she was a baby. CB# [phone]

## 2013-07-14 ENCOUNTER — Other Ambulatory Visit: Payer: Self-pay | Admitting: Family Medicine

## 2013-07-15 NOTE — Telephone Encounter (Signed)
Med filled and faxed.  

## 2013-07-15 NOTE — Telephone Encounter (Signed)
Last OV 07-08-13 Med filled 04-13-13 #60 with 2  Low risk

## 2013-07-18 DIAGNOSIS — R490 Dysphonia: Secondary | ICD-10-CM | POA: Diagnosis not present

## 2013-07-18 DIAGNOSIS — R12 Heartburn: Secondary | ICD-10-CM | POA: Diagnosis not present

## 2013-07-29 ENCOUNTER — Other Ambulatory Visit (INDEPENDENT_AMBULATORY_CARE_PROVIDER_SITE_OTHER): Payer: Medicare Other

## 2013-07-29 DIAGNOSIS — D709 Neutropenia, unspecified: Secondary | ICD-10-CM

## 2013-07-29 LAB — CBC WITH DIFFERENTIAL/PLATELET
Basophils Absolute: 0 10*3/uL (ref 0.0–0.1)
Basophils Relative: 0.9 % (ref 0.0–3.0)
Eosinophils Absolute: 0.1 10*3/uL (ref 0.0–0.7)
Eosinophils Relative: 4.3 % (ref 0.0–5.0)
HCT: 32.2 % — ABNORMAL LOW (ref 36.0–46.0)
Hemoglobin: 10.8 g/dL — ABNORMAL LOW (ref 12.0–15.0)
Lymphocytes Relative: 44 % (ref 12.0–46.0)
Lymphs Abs: 1.5 10*3/uL (ref 0.7–4.0)
MCHC: 33.4 g/dL (ref 30.0–36.0)
MCV: 81.3 fl (ref 78.0–100.0)
Monocytes Absolute: 0.3 10*3/uL (ref 0.1–1.0)
Monocytes Relative: 9.7 % (ref 3.0–12.0)
Neutro Abs: 1.4 10*3/uL (ref 1.4–7.7)
Neutrophils Relative %: 41.1 % — ABNORMAL LOW (ref 43.0–77.0)
Platelets: 220 10*3/uL (ref 150.0–400.0)
RBC: 3.95 Mil/uL (ref 3.87–5.11)
RDW: 15 % (ref 11.5–15.5)
WBC: 3.5 10*3/uL — ABNORMAL LOW (ref 4.0–10.5)

## 2013-08-01 ENCOUNTER — Encounter: Payer: Self-pay | Admitting: Family Medicine

## 2013-08-01 ENCOUNTER — Telehealth: Payer: Self-pay | Admitting: Family Medicine

## 2013-08-01 DIAGNOSIS — N950 Postmenopausal bleeding: Secondary | ICD-10-CM

## 2013-08-01 NOTE — Telephone Encounter (Signed)
Caller name: Bjorn LoserRhonda  Call back number:[phone]   Reason for call:   Pt would like a new referral or recommendation to a different OBGYN than Dr. Ambrose MantleHenley.      Pt would like a call about labs

## 2013-08-01 NOTE — Telephone Encounter (Signed)
Please refer to Regina Gonzalez for post-menopausal bleeding Please call and see what questions she has regarding labs

## 2013-08-01 NOTE — Telephone Encounter (Signed)
Spoke with pt she said that she did not understand why we repeated her labs. Advised pt that we were rechecking her blood count. Also pt stated that she "does not know" if she is still bleeding. She is ok with the referral  To [facility].

## 2013-08-02 ENCOUNTER — Encounter: Payer: Self-pay | Admitting: Family Medicine

## 2013-08-02 DIAGNOSIS — H04129 Dry eye syndrome of unspecified lacrimal gland: Secondary | ICD-10-CM | POA: Diagnosis not present

## 2013-08-02 NOTE — Telephone Encounter (Signed)
Pt called in to just add that she is bleeding a little bit more and will email you later.

## 2013-08-03 DIAGNOSIS — Z1151 Encounter for screening for human papillomavirus (HPV): Secondary | ICD-10-CM | POA: Diagnosis not present

## 2013-08-03 DIAGNOSIS — Z124 Encounter for screening for malignant neoplasm of cervix: Secondary | ICD-10-CM | POA: Diagnosis not present

## 2013-08-03 DIAGNOSIS — N950 Postmenopausal bleeding: Secondary | ICD-10-CM | POA: Diagnosis not present

## 2013-08-03 DIAGNOSIS — K644 Residual hemorrhoidal skin tags: Secondary | ICD-10-CM | POA: Diagnosis not present

## 2013-08-09 ENCOUNTER — Other Ambulatory Visit: Payer: Self-pay | Admitting: Family Medicine

## 2013-08-09 NOTE — Telephone Encounter (Signed)
Med filled.  

## 2013-08-11 DIAGNOSIS — K5900 Constipation, unspecified: Secondary | ICD-10-CM | POA: Diagnosis not present

## 2013-08-11 DIAGNOSIS — K921 Melena: Secondary | ICD-10-CM | POA: Diagnosis not present

## 2013-08-12 DIAGNOSIS — N950 Postmenopausal bleeding: Secondary | ICD-10-CM | POA: Diagnosis not present

## 2013-08-16 ENCOUNTER — Encounter: Payer: Self-pay | Admitting: General Practice

## 2013-09-22 ENCOUNTER — Other Ambulatory Visit: Payer: Self-pay | Admitting: Family Medicine

## 2013-09-22 NOTE — Telephone Encounter (Signed)
Med filled.  

## 2013-09-23 ENCOUNTER — Telehealth: Payer: Self-pay

## 2013-09-23 ENCOUNTER — Ambulatory Visit (INDEPENDENT_AMBULATORY_CARE_PROVIDER_SITE_OTHER): Payer: Medicare Other

## 2013-09-23 DIAGNOSIS — Z23 Encounter for immunization: Secondary | ICD-10-CM

## 2013-09-23 NOTE — Telephone Encounter (Signed)
Pt arrived to receive pneumonia vaccine. Verbal order given by Dr. Beverely Lowabori to give Prevnar-13.

## 2013-10-18 DIAGNOSIS — R198 Other specified symptoms and signs involving the digestive system and abdomen: Secondary | ICD-10-CM | POA: Diagnosis not present

## 2013-10-18 DIAGNOSIS — R142 Eructation: Secondary | ICD-10-CM | POA: Diagnosis not present

## 2013-10-18 DIAGNOSIS — R143 Flatulence: Secondary | ICD-10-CM | POA: Diagnosis not present

## 2013-10-18 DIAGNOSIS — R141 Gas pain: Secondary | ICD-10-CM | POA: Diagnosis not present

## 2013-10-20 ENCOUNTER — Encounter: Payer: Self-pay | Admitting: Family Medicine

## 2013-10-20 MED ORDER — ALPRAZOLAM 0.5 MG PO TABS: ORAL_TABLET | ORAL | Status: DC

## 2013-10-20 NOTE — Telephone Encounter (Signed)
Med filled.  

## 2013-10-21 ENCOUNTER — Encounter: Payer: Self-pay | Admitting: Family Medicine

## 2013-10-25 DIAGNOSIS — K5900 Constipation, unspecified: Secondary | ICD-10-CM | POA: Diagnosis not present

## 2013-10-25 DIAGNOSIS — K648 Other hemorrhoids: Secondary | ICD-10-CM | POA: Diagnosis not present

## 2013-10-25 DIAGNOSIS — R198 Other specified symptoms and signs involving the digestive system and abdomen: Secondary | ICD-10-CM | POA: Diagnosis not present

## 2013-10-25 DIAGNOSIS — K5730 Diverticulosis of large intestine without perforation or abscess without bleeding: Secondary | ICD-10-CM | POA: Diagnosis not present

## 2013-10-25 DIAGNOSIS — D649 Anemia, unspecified: Secondary | ICD-10-CM | POA: Diagnosis not present

## 2013-10-25 LAB — HM COLONOSCOPY

## 2013-10-28 ENCOUNTER — Other Ambulatory Visit: Payer: Self-pay

## 2013-10-28 DIAGNOSIS — Z1231 Encounter for screening mammogram for malignant neoplasm of breast: Secondary | ICD-10-CM

## 2013-11-07 ENCOUNTER — Encounter: Payer: Self-pay | Admitting: General Practice

## 2013-11-08 DIAGNOSIS — K648 Other hemorrhoids: Secondary | ICD-10-CM | POA: Diagnosis not present

## 2013-11-09 ENCOUNTER — Other Ambulatory Visit: Payer: Self-pay | Admitting: Family Medicine

## 2013-11-09 NOTE — Telephone Encounter (Signed)
Med filled.  

## 2013-11-17 ENCOUNTER — Ambulatory Visit
Admission: RE | Admit: 2013-11-17 | Discharge: 2013-11-17 | Disposition: A | Discharge status: Home / Self Care | Payer: Medicare Other | Source: Ambulatory Visit

## 2013-11-17 DIAGNOSIS — Z1231 Encounter for screening mammogram for malignant neoplasm of breast: Secondary | ICD-10-CM | POA: Diagnosis not present

## 2013-11-25 ENCOUNTER — Ambulatory Visit (INDEPENDENT_AMBULATORY_CARE_PROVIDER_SITE_OTHER): Payer: Medicare Other

## 2013-11-25 DIAGNOSIS — Z23 Encounter for immunization: Secondary | ICD-10-CM | POA: Diagnosis not present

## 2013-12-20 ENCOUNTER — Ambulatory Visit (INDEPENDENT_AMBULATORY_CARE_PROVIDER_SITE_OTHER): Payer: Medicare Other | Admitting: Physician Assistant

## 2013-12-20 ENCOUNTER — Encounter: Payer: Self-pay | Admitting: Physician Assistant

## 2013-12-20 VITALS — BP 140/64 | HR 64 | Temp 97.8°F | Resp 16 | Ht 62.0 in | Wt 114.5 lb

## 2013-12-20 DIAGNOSIS — J019 Acute sinusitis, unspecified: Secondary | ICD-10-CM | POA: Diagnosis not present

## 2013-12-20 DIAGNOSIS — B9689 Other specified bacterial agents as the cause of diseases classified elsewhere: Secondary | ICD-10-CM

## 2013-12-20 MED ORDER — FLUTICASONE PROPIONATE 50 MCG/ACT NA SUSP: 2.0000 | Freq: Every day | NASAL | Status: DC

## 2013-12-20 MED ORDER — AMOXICILLIN-POT CLAVULANATE 875-125 MG PO TABS: 1.0000 | ORAL_TABLET | Freq: Two times a day (BID) | ORAL | Status: DC

## 2013-12-20 NOTE — Progress Notes (Signed)
History of Present Illness: Regina Gonzalez is a 68 y.o. female who present to the clinic today complaining of sinus pressure, sinus pain and nasal congestion. Patient endorses thick and colored drainage.  Endorses bilateral ear pressure, popping and pain.  Patient denies fever, tooth pain, SOB or wheezing.  Denies recent travel or sick contact.   History: Past Medical History  Diagnosis Date  . Allergy   . Anxiety   . Arthritis   . COPD (chronic obstructive pulmonary disease)   . Asthma   . Blood transfusion   . Depression   . GERD (gastroesophageal reflux disease)     Barrett's  . Hyperlipidemia   . Cyst of pancreas     benign  . Liver cyst     benign  . Kidney cysts     benign  . Osteoporosis   . TIA (transient ischemic attack)   . IBS (irritable bowel syndrome)   . External hemorrhoid    Current outpatient prescriptions: ALPRAZolam (XANAX) 0.5 MG tablet, TAKE ONE OR TWO TABLETS BY MOUTH THREE TIMES DAILY AS NEEDED FOR anxiety, Disp: 60 tablet, Rfl: 2;  aspirin 81 MG tablet, Take 81 mg by mouth daily., Disp: , Rfl: ;  calcium citrate (CALCITRATE - DOSED IN MG ELEMENTAL CALCIUM) 950 MG tablet, Take 1 tablet by mouth daily. Alternates with Multi-vitamin, Disp: , Rfl:  citalopram (CELEXA) 20 MG tablet, TAKE TWO TABLETS BY MOUTH DAILY , Disp: 60 tablet, Rfl: 1;  dicyclomine (BENTYL) 20 MG tablet, TAKE ONE TABLET BY MOUTH THREE TIMES DAILY BETWEEN MEALS AS NEEDED, Disp: 30 tablet, Rfl: 3;  fish oil-omega-3 fatty acids 1000 MG capsule, Take 1 g by mouth See admin instructions. Five times a week, Disp: , Rfl:  Multiple Vitamins-Minerals (MULTIVITAMIN WITH MINERALS) tablet, Take 1 tablet by mouth 4 (four) times a week. Alternates with Caltrate, Disp: , Rfl: ;  pantoprazole (PROTONIX) 40 MG tablet, TAKE ONE TABLET BY MOUTH ONE TIME DAILY , Disp: 30 tablet, Rfl: 2;  Pramox-PE-Glycerin-Petrolatum (PREPARATION H) 1-0.25-14.4-15 % CREA, Place 1 application rectally as needed., Disp: , Rfl:    simvastatin (ZOCOR) 20 MG tablet, TAKE ONE TABLET BY MOUTH AT BEDTIME, Disp: 90 tablet, Rfl: 1;  amoxicillin-clavulanate (AUGMENTIN) 875-125 MG per tablet, Take 1 tablet by mouth 2 (two) times daily., Disp: 20 tablet, Rfl: 0;  fluticasone (FLONASE) 50 MCG/ACT nasal spray, Place 2 sprays into both nostrils daily., Disp: 16 g, Rfl: 6 Allergies  Allergen Reactions  . Erythromycin Other (See Comments)    Allergic to all mycin drugs gets severe stomach cramps - ALL MYCINS  . Metronidazole    Family History  Problem Relation Age of Onset  . Colon cancer Neg Hx   . Alcoholism Father   . Diabetes type II Brother   . Heart attack Brother   . Multiple sclerosis Daughter    History   Social History  . Marital Status: Married    Spouse Name: Channing MuttersRoy    Number of Children: 2  . Years of Education: 12   Occupational History  . [employer]    Social History Main Topics  . Smoking status: Former Games developermoker  . Smokeless tobacco: Never Used  . Alcohol Use: No  . Drug Use: No  . Sexual Activity: None   Other Topics Concern  . None   Social History Narrative   Patient lives at home wit her husband  Channing Mutters(Roy). works for [employer]. Patient has high school education and two children. Caffeine 2-3 cups daily.   Review of Systems: See HPI.  All other ROS are negative.  Physical Examination: BP 141/44 mmHg  Pulse 56  Temp(Src) 97.8 F (36.6 C) (Oral)  Resp 16  Ht 5\' 2"  (1.575 m)  Wt 114 lb 8 oz (51.937 kg)  BMI 20.94 kg/m2  SpO2 100%  LMP 06/16/1997  General appearance: alert, cooperative and appears stated age Head: Normocephalic, without obvious abnormality, atraumatic, sinuses tender to percussion Eyes: conjunctivae/corneas clear. PERRL, EOM's intact. Fundi benign. Ears: normal TM's and external ear canals both ears Nose: moderate congestion, turbinates swollen, sinus tenderness bilateral Throat: lips, mucosa, and tongue normal; teeth and gums  normal Neck: no adenopathy, no carotid bruit, no JVD, supple, symmetrical, trachea midline and thyroid not enlarged, symmetric, no tenderness/mass/nodules Lungs: clear to auscultation bilaterally Chest wall: no tenderness  Assessment/Plan: Acute bacterial sinusitis Rx Augmentin.  Increase fluids.  Rest.  Saline nasal spray.  Probiotic.  Mucinex as directed.  Humidifier in bedroom. Rx Flonase.  Call or return to clinic if symptoms are not improving.

## 2013-12-20 NOTE — Assessment & Plan Note (Signed)
Rx Augmentin.  Increase fluids.  Rest.  Saline nasal spray.  Probiotic.  Mucinex as directed.  Humidifier in bedroom. Rx Flonase.  Call or return to clinic if symptoms are not improving.

## 2013-12-20 NOTE — Patient Instructions (Signed)
Please take antibiotic as directed.  Increase fluid intake.  Use Saline nasal spray.  Take a daily multivitamin. Rx Flonase.  Place a humidifier in the bedroom.  Please call or return clinic if symptoms are not improving.  Sinusitis Sinusitis is redness, soreness, and swelling (inflammation) of the paranasal sinuses. Paranasal sinuses are air pockets within the bones of your face (beneath the eyes, the middle of the forehead, or above the eyes). In healthy paranasal sinuses, mucus is able to drain out, and air is able to circulate through them by way of your nose. However, when your paranasal sinuses are inflamed, mucus and air can become trapped. This can allow bacteria and other germs to grow and cause infection. Sinusitis can develop quickly and last only a short time (acute) or continue over a long period (chronic). Sinusitis that lasts for more than 12 weeks is considered chronic.  CAUSES  Causes of sinusitis include:  Allergies.  Structural abnormalities, such as displacement of the cartilage that separates your nostrils (deviated septum), which can decrease the air flow through your nose and sinuses and affect sinus drainage.  Functional abnormalities, such as when the small hairs (cilia) that line your sinuses and help remove mucus do not work properly or are not present. SYMPTOMS  Symptoms of acute and chronic sinusitis are the same. The primary symptoms are pain and pressure around the affected sinuses. Other symptoms include:  Upper toothache.  Earache.  Headache.  Bad breath.  Decreased sense of smell and taste.  A cough, which worsens when you are lying flat.  Fatigue.  Fever.  Thick drainage from your nose, which often is green and may contain pus (purulent).  Swelling and warmth over the affected sinuses. DIAGNOSIS  Your caregiver will perform a physical exam. During the exam, your caregiver may:  Look in your nose for signs of abnormal growths in your nostrils  (nasal polyps).  Tap over the affected sinus to check for signs of infection.  View the inside of your sinuses (endoscopy) with a special imaging device with a light attached (endoscope), which is inserted into your sinuses. If your caregiver suspects that you have chronic sinusitis, one or more of the following tests may be recommended:  Allergy tests.  Nasal culture A sample of mucus is taken from your nose and sent to a lab and screened for bacteria.  Nasal cytology A sample of mucus is taken from your nose and examined by your caregiver to determine if your sinusitis is related to an allergy. TREATMENT  Most cases of acute sinusitis are related to a viral infection and will resolve on their own within 10 days. Sometimes medicines are prescribed to help relieve symptoms (pain medicine, decongestants, nasal steroid sprays, or saline sprays).  However, for sinusitis related to a bacterial infection, your caregiver will prescribe antibiotic medicines. These are medicines that will help kill the bacteria causing the infection.  Rarely, sinusitis is caused by a fungal infection. In theses cases, your caregiver will prescribe antifungal medicine. For some cases of chronic sinusitis, surgery is needed. Generally, these are cases in which sinusitis recurs more than 3 times per year, despite other treatments. HOME CARE INSTRUCTIONS   Drink plenty of water. Water helps thin the mucus so your sinuses can drain more easily.  Use a humidifier.  Inhale steam 3 to 4 times a day (for example, sit in the bathroom with the shower running).  Apply a warm, moist washcloth to your face 3 to 4 times  a day, or as directed by your caregiver.  Use saline nasal sprays to help moisten and clean your sinuses.  Take over-the-counter or prescription medicines for pain, discomfort, or fever only as directed by your caregiver. SEEK IMMEDIATE MEDICAL CARE IF:  You have increasing pain or severe headaches.  You  have nausea, vomiting, or drowsiness.  You have swelling around your face.  You have vision problems.  You have a stiff neck.  You have difficulty breathing. MAKE SURE YOU:   Understand these instructions.  Will watch your condition.  Will get help right away if you are not doing well or get worse. Document Released: 01/20/2005 Document Revised: 04/14/2011 Document Reviewed: 02/04/2011 [facility] Patient Information 2014 [place], [place].

## 2013-12-20 NOTE — Addendum Note (Signed)
Addended by: Marcelline MatesMARTIN, Germaine Ripp on: 12/20/2013 10:51 AM   Modules accepted: Kipp BroodSmartSet

## 2013-12-20 NOTE — Progress Notes (Signed)
Pre visit review using our clinic review tool, if applicable. No additional management support is needed unless otherwise documented below in the visit note/SLS  

## 2013-12-25 ENCOUNTER — Encounter (HOSPITAL_BASED_OUTPATIENT_CLINIC_OR_DEPARTMENT_OTHER): Payer: Self-pay | Admitting: *Deleted

## 2013-12-25 ENCOUNTER — Emergency Department (HOSPITAL_BASED_OUTPATIENT_CLINIC_OR_DEPARTMENT_OTHER)
Admission: EM | Admit: 2013-12-25 | Discharge: 2013-12-25 | Disposition: A | Discharge status: Home / Self Care | Payer: Medicare Other | Attending: Emergency Medicine | Admitting: Emergency Medicine

## 2013-12-25 DIAGNOSIS — K219 Gastro-esophageal reflux disease without esophagitis: Secondary | ICD-10-CM | POA: Diagnosis not present

## 2013-12-25 DIAGNOSIS — B349 Viral infection, unspecified: Secondary | ICD-10-CM | POA: Insufficient documentation

## 2013-12-25 DIAGNOSIS — M810 Age-related osteoporosis without current pathological fracture: Secondary | ICD-10-CM | POA: Insufficient documentation

## 2013-12-25 DIAGNOSIS — Z8679 Personal history of other diseases of the circulatory system: Secondary | ICD-10-CM | POA: Diagnosis not present

## 2013-12-25 DIAGNOSIS — Z79899 Other long term (current) drug therapy: Secondary | ICD-10-CM | POA: Insufficient documentation

## 2013-12-25 DIAGNOSIS — J449 Chronic obstructive pulmonary disease, unspecified: Secondary | ICD-10-CM | POA: Insufficient documentation

## 2013-12-25 DIAGNOSIS — Z7951 Long term (current) use of inhaled steroids: Secondary | ICD-10-CM | POA: Insufficient documentation

## 2013-12-25 DIAGNOSIS — Z8673 Personal history of transient ischemic attack (TIA), and cerebral infarction without residual deficits: Secondary | ICD-10-CM | POA: Insufficient documentation

## 2013-12-25 DIAGNOSIS — K589 Irritable bowel syndrome without diarrhea: Secondary | ICD-10-CM | POA: Diagnosis not present

## 2013-12-25 DIAGNOSIS — Q6100 Congenital renal cyst, unspecified: Secondary | ICD-10-CM | POA: Insufficient documentation

## 2013-12-25 DIAGNOSIS — Z87891 Personal history of nicotine dependence: Secondary | ICD-10-CM | POA: Diagnosis not present

## 2013-12-25 DIAGNOSIS — M791 Myalgia, unspecified site: Secondary | ICD-10-CM

## 2013-12-25 DIAGNOSIS — M1990 Unspecified osteoarthritis, unspecified site: Secondary | ICD-10-CM | POA: Diagnosis not present

## 2013-12-25 DIAGNOSIS — E785 Hyperlipidemia, unspecified: Secondary | ICD-10-CM | POA: Diagnosis not present

## 2013-12-25 DIAGNOSIS — F329 Major depressive disorder, single episode, unspecified: Secondary | ICD-10-CM | POA: Diagnosis not present

## 2013-12-25 DIAGNOSIS — Z792 Long term (current) use of antibiotics: Secondary | ICD-10-CM | POA: Diagnosis not present

## 2013-12-25 DIAGNOSIS — F419 Anxiety disorder, unspecified: Secondary | ICD-10-CM | POA: Insufficient documentation

## 2013-12-25 LAB — URINALYSIS, ROUTINE W REFLEX MICROSCOPIC
Bilirubin Urine: NEGATIVE
Glucose, UA: NEGATIVE mg/dL
Ketones, ur: NEGATIVE mg/dL
Leukocytes, UA: NEGATIVE
Nitrite: NEGATIVE
Protein, ur: NEGATIVE mg/dL
Specific Gravity, Urine: 1.004 — ABNORMAL LOW (ref 1.005–1.030)
Urobilinogen, UA: 0.2 mg/dL (ref 0.0–1.0)
pH: 7 (ref 5.0–8.0)

## 2013-12-25 LAB — URINE MICROSCOPIC-ADD ON

## 2013-12-25 NOTE — Discharge Instructions (Signed)
Viral Infections °A viral infection can be caused by different types of viruses. Most viral infections are not serious and resolve on their own. However, some infections may cause severe symptoms and may lead to further complications. °SYMPTOMS °Viruses can frequently cause: °· Minor sore throat. °· Aches and pains. °· Headaches. °· Runny nose. °· Different types of rashes. °· Watery eyes. °· Tiredness. °· Cough. °· Loss of appetite. °· Gastrointestinal infections, resulting in nausea, vomiting, and diarrhea. °These symptoms do not respond to antibiotics because the infection is not caused by bacteria. However, you might catch a bacterial infection following the viral infection. This is sometimes called a "superinfection." Symptoms of such a bacterial infection may include: °· Worsening sore throat with pus and difficulty swallowing. °· Swollen neck glands. °· Chills and a high or persistent fever. °· Severe headache. °· Tenderness over the sinuses. °· Persistent overall ill feeling (malaise), muscle aches, and tiredness (fatigue). °· Persistent cough. °· Yellow, green, or brown mucus production with coughing. °HOME CARE INSTRUCTIONS  °· Only take over-the-counter or prescription medicines for pain, discomfort, diarrhea, or fever as directed by your caregiver. °· Drink enough water and fluids to keep your urine clear or pale yellow. Sports drinks can provide valuable electrolytes, sugars, and hydration. °· Get plenty of rest and maintain proper nutrition. Soups and broths with crackers or rice are fine. °SEEK IMMEDIATE MEDICAL CARE IF:  °· You have severe headaches, shortness of breath, chest pain, neck pain, or an unusual rash. °· You have uncontrolled vomiting, diarrhea, or you are unable to keep down fluids. °· You or your child has an oral temperature above 102° F (38.9° C), not controlled by medicine. °· Your baby is older than 3 months with a rectal temperature of 102° F (38.9° C) or higher. °· Your baby is 3  months old or younger with a rectal temperature of 100.4° F (38° C) or higher. °MAKE SURE YOU:  °· Understand these instructions. °· Will watch your condition. °· Will get help right away if you are not doing well or get worse. °Document Released: 10/30/2004 Document Revised: 04/14/2011 Document Reviewed: 05/27/2010 °ExitCare® Patient Information ©2015 [employer]. This information is not intended to replace advice given to you by your health care provider. Make sure you discuss any questions you have with your health care provider. ° °Muscle Pain °Muscle pain (myalgia) may be caused by many things, including: °· Overuse or muscle strain, especially if you are not in shape. This is the most common cause of muscle pain. °· Injury. °· Bruises. °· Viruses, such as the flu. °· Infectious diseases. °· Fibromyalgia, which is a chronic condition that causes muscle tenderness, fatigue, and headache. °· Autoimmune diseases, including lupus. °· Certain drugs, including ACE inhibitors and statins. °Muscle pain may be mild or severe. In most cases, the pain lasts only a short time and goes away without treatment. To diagnose the cause of your muscle pain, your health care provider will take your medical history. This means he or she will ask you when your muscle pain began and what has been happening. If you have not had muscle pain for very long, your health care provider may want to wait before doing much testing. If your muscle pain has lasted a long time, your health care provider may want to run tests right away. If your health care provider thinks your muscle pain may be caused by illness, you may need to have additional tests to rule out certain conditions.  °  Treatment for muscle pain depends on the cause. Home care is often enough to relieve muscle pain. Your health care provider may also prescribe anti-inflammatory medicine. °HOME CARE INSTRUCTIONS °Watch your condition for any changes. The following actions may help  to lessen any discomfort you are feeling: °· Only take over-the-counter or prescription medicines as directed by your health care provider. °· Apply ice to the sore muscle: °¨ Put ice in a plastic bag. °¨ Place a towel between your skin and the bag. °¨ Leave the ice on for 15-20 minutes, 3-4 times a day. °· You may alternate applying hot and cold packs to the muscle as directed by your health care provider. °· If overuse is causing your muscle pain, slow down your activities until the pain goes away. °¨ Remember that it is normal to feel some muscle pain after starting a workout program. Muscles that have not been used often will be sore at first. °¨ Do regular, gentle exercises if you are not usually active. °¨ Warm up before exercising to lower your risk of muscle pain. °· Do not continue working out if the pain is very bad. Bad pain could mean you have injured a muscle. °SEEK MEDICAL CARE IF: °· Your muscle pain gets worse, and medicines do not help. °· You have muscle pain that lasts longer than 3 days. °· You have a rash or fever along with muscle pain. °· You have muscle pain after a tick bite. °· You have muscle pain while working out, even though you are in good physical condition. °· You have redness, soreness, or swelling along with muscle pain. °· You have muscle pain after starting a new medicine or changing the dose of a medicine. °SEEK IMMEDIATE MEDICAL CARE IF: °· You have trouble breathing. °· You have trouble swallowing. °· You have muscle pain along with a stiff neck, fever, and vomiting. °· You have severe muscle weakness or cannot move part of your body. °MAKE SURE YOU:  °· Understand these instructions. °· Will watch your condition. °· Will get help right away if you are not doing well or get worse. °Document Released: 12/12/2005 Document Revised: 01/25/2013 Document Reviewed: 11/16/2012 °ExitCare® Patient Information ©2015 [employer]. This information is not intended to replace advice given  to you by your health care provider. Make sure you discuss any questions you have with your health care provider. ° °

## 2013-12-25 NOTE — ED Notes (Signed)
Patient currently taking amoxicillin for sinus infection and was seen by primary MD on Tuesday. C/O generalized body aches

## 2013-12-25 NOTE — ED Provider Notes (Signed)
CSN: [id number]     Arrival date & time 12/25/13  1012 History   First MD Initiated Contact with Patient 12/25/13 1052     Chief Complaint  Patient presents with  . Generalized Body Aches     (Consider location/radiation/quality/duration/timing/severity/associated sxs/prior Treatment) HPI  This is a 68 year old female who presents with generalized bodyaches. Patient reports that she feels achy and is tired all over.  She has not noted any fevers at home. She was seen by her primary doctor on Tuesday and diagnosed with a sinus infection. She is currently taking amoxicillin. Patient states that she just "doesn't feel like myself." She reports decreased energy. She denies any chest pain, shortness of breath, cough. She's been taking Motrin at home. She denies any abdominal pain, vomiting, diarrhea. She denies any sick contacts. She does endorse mild dysuria. She feels like she is staying hydrated.  Past Medical History  Diagnosis Date  . Allergy   . Anxiety   . COPD (chronic obstructive pulmonary disease)   . Asthma   . Blood transfusion   . Depression   . GERD (gastroesophageal reflux disease)     Barrett's  . Hyperlipidemia   . Cyst of pancreas     benign  . Liver cyst     benign  . Kidney cysts     benign  . Osteoporosis   . TIA (transient ischemic attack)   . IBS (irritable bowel syndrome)   . External hemorrhoid   . Arthritis    Past Surgical History  Procedure Laterality Date  . Appendectomy    . Breast,biopsies,bilateral      simple cysts  . Colonoscopy    . Upper gastrointestinal endoscopy    . Ovarian cyst removal    . Tonsillectomy and adenoidectomy     Family History  Problem Relation Age of Onset  . Colon cancer Neg Hx   . Alcoholism Father   . Diabetes type II Brother   . Heart attack Brother   . Multiple sclerosis Daughter    History  Substance Use Topics  . Smoking status: Former Games developermoker  . Smokeless tobacco: Never Used  . Alcohol Use: No   OB  History    No data available     Review of Systems  Constitutional: Negative for fever and chills.  HENT: Positive for congestion and sinus pressure.   Respiratory: Negative for cough, chest tightness and shortness of breath.   Cardiovascular: Negative for chest pain.  Gastrointestinal: Negative for nausea, vomiting and abdominal pain.  Genitourinary: Negative for dysuria.  Musculoskeletal: Positive for myalgias. Negative for back pain.  Skin: Negative for rash.  Neurological: Negative for headaches.  All other systems reviewed and are negative.     Allergies  Erythromycin and Metronidazole  Home Medications   Prior to Admission medications   Medication Sig Start Date End Date Taking? Authorizing Provider  ALPRAZolam Prudy Feeler(XANAX) 0.5 MG tablet TAKE ONE OR TWO TABLETS BY MOUTH THREE TIMES DAILY AS NEEDED FOR anxiety 10/20/13   Sheliah HatchKatherine E Tabori, MD  amoxicillin-clavulanate (AUGMENTIN) 875-125 MG per tablet Take 1 tablet by mouth 2 (two) times daily. 12/20/13   Waldon MerlWilliam C Martin, PA-C  aspirin 81 MG tablet Take 81 mg by mouth as needed.     Historical Provider, MD  calcium citrate (CALCITRATE - DOSED IN MG ELEMENTAL CALCIUM) 950 MG tablet Take 1 tablet by mouth daily. Alternates with Multi-vitamin    Historical Provider, MD  citalopram (CELEXA) 20 MG tablet TAKE TWO TABLETS  BY MOUTH DAILY  11/09/13   Sheliah HatchKatherine E Tabori, MD  dicyclomine (BENTYL) 20 MG tablet TAKE ONE TABLET BY MOUTH THREE TIMES DAILY BETWEEN MEALS AS NEEDED 10/26/12   Sheliah HatchKatherine E Tabori, MD  fish oil-omega-3 fatty acids 1000 MG capsule Take 1 g by mouth See admin instructions. Five times a week    Historical Provider, MD  fluticasone (FLONASE) 50 MCG/ACT nasal spray Place 2 sprays into both nostrils daily. 12/20/13   Waldon MerlWilliam C Martin, PA-C  Multiple Vitamins-Minerals (MULTIVITAMIN WITH MINERALS) tablet Take 1 tablet by mouth 4 (four) times a week. Alternates with Caltrate    Historical Provider, MD  pantoprazole (PROTONIX) 40  MG tablet TAKE ONE TABLET BY MOUTH ONE TIME DAILY  09/22/13   Sheliah HatchKatherine E Tabori, MD  Pramox-PE-Glycerin-Petrolatum (PREPARATION H) 1-0.25-14.4-15 % CREA Place 1 application rectally as needed.    Historical Provider, MD  simvastatin (ZOCOR) 20 MG tablet TAKE ONE TABLET BY MOUTH AT BEDTIME 01/14/13   Sheliah HatchKatherine E Tabori, MD   BP 148/53 mmHg  Pulse 77  Temp(Src) 98 F (36.7 C) (Oral)  Resp 18  Ht 5\' 2"  (1.575 m)  Wt 114 lb (51.71 kg)  BMI 20.85 kg/m2  SpO2 98%  LMP 06/16/1997 Physical Exam  Constitutional: She is oriented to person, place, and time. She appears well-developed and well-nourished.  thin  HENT:  Head: Normocephalic and atraumatic.  Mouth/Throat: Oropharynx is clear and moist.  No maxillary sinus tenderness  Eyes: Pupils are equal, round, and reactive to light.  Neck: Normal range of motion. Neck supple.  Cardiovascular: Normal rate, regular rhythm and normal heart sounds.   No murmur heard. Pulmonary/Chest: Effort normal and breath sounds normal. No respiratory distress. She has no wheezes.  Abdominal: Soft. Bowel sounds are normal. There is no tenderness. There is no rebound.  Neurological: She is alert and oriented to person, place, and time.  Skin: Skin is warm and dry.  Psychiatric: She has a normal mood and affect.  Nursing note and vitals reviewed.   ED Course  Procedures (including critical care time) Labs Review Labs Reviewed  URINALYSIS, ROUTINE W REFLEX MICROSCOPIC - Abnormal; Notable for the following:    Specific Gravity, Urine 1.004 (*)    Hgb urine dipstick SMALL (*)    All other components within normal limits  URINE MICROSCOPIC-ADD ON    Imaging Review No results found.   EKG Interpretation None      MDM   Final diagnoses:  Myalgia  Viral infection    Patient presents with diffuse myalgias. Currently being treated for sinus infection with amoxicillin. Is otherwise nontoxic on exam. Afebrile and vital signs within normal limits. She  has no other infectious symptoms including cough, vomiting, diarrhea, chest pain, shortness of breath, or abdominal pain. Physical exam is benign. Urinalysis obtained to evaluate for urinary tract infection and to assess volume status. No evidence of overt dehydration and no evidence of urinary tract infection.  Discussed with patient that this may be viral in nature. She needs to maintain good hydration at home and continue Motrin for body aches. She is to follow-up with her primary physician in one to 2 days if symptoms continue.  After history, exam, and medical workup I feel the patient has been appropriately medically screened and is safe for discharge home. Pertinent diagnoses were discussed with the patient. Patient was given return precautions.     Shon Batonourtney F Jd Mccaster, MD 12/27/13 [phone]

## 2013-12-27 ENCOUNTER — Other Ambulatory Visit: Payer: Self-pay | Admitting: General Practice

## 2013-12-27 MED ORDER — PANTOPRAZOLE SODIUM 40 MG PO TBEC: 40.0000 mg | DELAYED_RELEASE_TABLET | Freq: Every day | ORAL | Status: DC

## 2014-01-09 ENCOUNTER — Encounter: Payer: Self-pay | Admitting: Family Medicine

## 2014-01-09 ENCOUNTER — Ambulatory Visit (INDEPENDENT_AMBULATORY_CARE_PROVIDER_SITE_OTHER): Payer: Medicare Other | Admitting: Family Medicine

## 2014-01-09 VITALS — BP 140/82 | HR 68 | Temp 97.6°F | Resp 16 | Wt 115.2 lb

## 2014-01-09 DIAGNOSIS — R319 Hematuria, unspecified: Secondary | ICD-10-CM

## 2014-01-09 DIAGNOSIS — R8299 Other abnormal findings in urine: Secondary | ICD-10-CM | POA: Diagnosis not present

## 2014-01-09 DIAGNOSIS — M546 Pain in thoracic spine: Secondary | ICD-10-CM | POA: Diagnosis not present

## 2014-01-09 DIAGNOSIS — R8290 Unspecified abnormal findings in urine: Secondary | ICD-10-CM | POA: Diagnosis not present

## 2014-01-09 LAB — POCT URINALYSIS DIPSTICK
Bilirubin, UA: NEGATIVE
Glucose, UA: NEGATIVE
Ketones, UA: NEGATIVE
Leukocytes, UA: NEGATIVE
Nitrite, UA: NEGATIVE
Protein, UA: NEGATIVE
Spec Grav, UA: 1.02
Urobilinogen, UA: 0.2
pH, UA: 6

## 2014-01-09 LAB — CBC WITH DIFFERENTIAL/PLATELET
Basophils Absolute: 0 10*3/uL (ref 0.0–0.1)
Basophils Relative: 0.9 % (ref 0.0–3.0)
Eosinophils Absolute: 0.2 10*3/uL (ref 0.0–0.7)
Eosinophils Relative: 4.2 % (ref 0.0–5.0)
HCT: 33 % — ABNORMAL LOW (ref 36.0–46.0)
Hemoglobin: 10.6 g/dL — ABNORMAL LOW (ref 12.0–15.0)
Lymphocytes Relative: 44 % (ref 12.0–46.0)
Lymphs Abs: 1.6 10*3/uL (ref 0.7–4.0)
MCHC: 32.2 g/dL (ref 30.0–36.0)
MCV: 78.1 fl (ref 78.0–100.0)
Monocytes Absolute: 0.4 10*3/uL (ref 0.1–1.0)
Monocytes Relative: 9.7 % (ref 3.0–12.0)
Neutro Abs: 1.5 10*3/uL (ref 1.4–7.7)
Neutrophils Relative %: 41.2 % — ABNORMAL LOW (ref 43.0–77.0)
Platelets: 251 10*3/uL (ref 150.0–400.0)
RBC: 4.23 Mil/uL (ref 3.87–5.11)
RDW: 18.1 % — ABNORMAL HIGH (ref 11.5–15.5)
WBC: 3.7 10*3/uL — ABNORMAL LOW (ref 4.0–10.5)

## 2014-01-09 MED ORDER — MELOXICAM 7.5 MG PO TABS: 7.5000 mg | ORAL_TABLET | Freq: Every day | ORAL | Status: DC

## 2014-01-09 NOTE — Patient Instructions (Signed)
Follow up as needed We'll notify you of your lab and urine results Start the Meloxicam daily in the AM- take w/ food- for inflammation x7 days and then as needed Try and work on your posture- slouching puts a lot of pressure on various muscles Call with any questions or concerns Happy Holidays!

## 2014-01-09 NOTE — Progress Notes (Signed)
Pre visit review using our clinic review tool, if applicable. No additional management support is needed unless otherwise documented below in the visit note. 

## 2014-01-09 NOTE — Progress Notes (Signed)
   Subjective:    Patient ID: Regina Gonzalez, female    DOB: Jul 12, 1945, 68 y.o.   MRN: [id number]  HPI R sided back pain- sxs started 'a couple of months ago'.  Worsening.  Described as an intermittent, dull ache- 'like when you work out'  Pain doesn't change w/ movement.  Occasionally sore to touch.  No changes in activity level.  No dysuria, hematuria.     Review of Systems For ROS see HPI     Objective:   Physical Exam  Constitutional: She is oriented to person, place, and time. She appears well-developed and well-nourished. No distress.  Abdominal: Soft. Bowel sounds are normal. She exhibits no distension. There is no tenderness. There is no rebound and no guarding.  Musculoskeletal:  + TTP over R lat w/ mild spasm No CVA tenderness Full ROM of shoulders bilaterally Full flexion/extension of spine  Neurological: She is alert and oriented to person, place, and time. She has normal reflexes. No cranial nerve deficit. Coordination normal.  Skin: Skin is warm and dry. No rash noted. No erythema.  Vitals reviewed.         Assessment & Plan:

## 2014-01-10 LAB — URINE CULTURE
Colony Count: NO GROWTH
Organism ID, Bacteria: NO GROWTH

## 2014-01-10 NOTE — Assessment & Plan Note (Signed)
New.  Pt w/ + TTP over R lat w/ mild spasm.  No CVA tenderness, no abd pain, no urinary complaints.  Start low dose NSAIDs for muscle pain.  Offered flexeril- pt declined.  Heat prn.  Reviewed supportive care and red flags that should prompt return.  Pt expressed understanding and is in agreement w/ plan.

## 2014-01-16 ENCOUNTER — Encounter: Payer: Self-pay | Admitting: Family Medicine

## 2014-01-17 MED ORDER — CITALOPRAM HYDROBROMIDE 20 MG PO TABS: 40.0000 mg | ORAL_TABLET | Freq: Every day | ORAL | Status: DC

## 2014-01-17 NOTE — Telephone Encounter (Signed)
Med filled.  

## 2014-01-23 ENCOUNTER — Other Ambulatory Visit: Payer: Self-pay | Admitting: Family Medicine

## 2014-01-23 NOTE — Telephone Encounter (Signed)
Last OV 01-09-14 Alprazolam last filled 10-20-13 #60 with 2

## 2014-01-23 NOTE — Telephone Encounter (Signed)
Med filled and faxed.  

## 2014-02-06 ENCOUNTER — Other Ambulatory Visit: Payer: Self-pay | Admitting: General Practice

## 2014-02-06 MED ORDER — DICYCLOMINE HCL 20 MG PO TABS: ORAL_TABLET | ORAL | Status: DC

## 2014-03-07 DIAGNOSIS — K648 Other hemorrhoids: Secondary | ICD-10-CM | POA: Diagnosis not present

## 2014-03-15 ENCOUNTER — Telehealth: Payer: Self-pay | Admitting: *Deleted

## 2014-03-15 NOTE — Telephone Encounter (Signed)
Prior authorization for citalopram initiated. Awaiting determination. JG//CMA

## 2014-03-16 NOTE — Telephone Encounter (Signed)
Approved effective 03/15/2014 through "until further notice"

## 2014-03-21 ENCOUNTER — Other Ambulatory Visit: Payer: Self-pay | Admitting: Family Medicine

## 2014-03-22 NOTE — Telephone Encounter (Signed)
Last OV 01-09-14 Alprazolam last filled 01-23-14 #60 with 1

## 2014-03-22 NOTE — Telephone Encounter (Signed)
Med filled and faxed.  

## 2014-03-27 ENCOUNTER — Other Ambulatory Visit (INDEPENDENT_AMBULATORY_CARE_PROVIDER_SITE_OTHER): Payer: Self-pay | Admitting: General Surgery

## 2014-03-27 DIAGNOSIS — K648 Other hemorrhoids: Secondary | ICD-10-CM | POA: Diagnosis not present

## 2014-03-27 NOTE — H&P (Signed)
Daveah C. Kindall 03/27/2014 12:05 PM Location: [facility] Patient #: [id number] DOB: 16-Feb-1945 Married / Language: [place] / Race: White Female History of Present Illness Romie Levee(Rosa Wyly MD; 03/27/2014 2:40 PM) Patient words: recheck hems.  The patient is a 69 year old female who presents with a complaint of anal problems. Pt of Dr. Dulce Sellarutlaw. She complains of a mucus leakage around her anus. She reports very occasional bleeding. Her colonoscopy performed recently was negative for any other masses or polyps according to the patient. She does have occasional constipation. This has been worse recently. Her bowel habits are irregular. She denies any anal pain except for with excessive wiping. She is currently trying to increase her fiber intake to help with these symptoms but admits to not doing this on a regular basis. Her anal symptoms worsen with her constipation. She also complains of bloating. Other Problems Romie Levee(Anes Rigel, MD; 03/27/2014 2:41 PM) Alcohol Abuse Anxiety Disorder Arthritis Cerebrovascular Accident Chronic Obstructive Lung Disease Diverticulosis Gastroesophageal Reflux Disease Hypercholesterolemia PROLAPSED INTERNAL HEMORRHOIDS, GRADE 3 (455.2  K64.8) This is a 69 year old female who presents to the office with complaints of abdominal bloating and irregular bowel movements and mucous drainage from her anal canal. Her anal symptoms are getting worse. She would like to proceed with surgery.  Past Surgical History Romie Levee(Taqwa Deem, MD; 03/27/2014 2:41 PM) Appendectomy Breast Biopsy Bilateral. Oral Surgery Tonsillectomy  Allergies Romie Levee(Keshawna Dix, MD; 03/27/2014 2:41 PM) MetroNIDAZOLE *ANTI-INFECTIVE AGENTS - MISC.* Erythromycin (Acne Aid) *DERMATOLOGICALS*  Medication History Romie Levee(Isaiyah Feldhaus, MD; 03/27/2014 2:41 PM) Medications Reconciled Xanax (0.5MG  Tablet, Oral as needed) Active. Citalopram Hydrobromide (20MG  Tablet, Oral)  Active. Multivitamin Adults 50+ (Oral daily) Active. Pantoprazole Sodium (40MG  Tablet DR, Oral daily) Active. Simvastatin (20MG  Tablet, Oral daily) Active.  Social History Romie Levee(Tenille Morrill, MD; 03/27/2014 2:41 PM) No drug use Tobacco use Former smoker. Alcohol use Remotely quit alcohol use. Caffeine use Coffee.  Family History Romie Levee(Jennafer Gladue, MD; 03/27/2014 2:41 PM) Alcohol Abuse Father. Arthritis Mother. Diabetes Mellitus Brother. Heart Disease Brother. Heart disease in female family member before age 67    Vitals Maryan Puls(Christy Moore MA; 03/27/2014 12:05 PM) 03/27/2014 12:05 PM Weight: 115.6 lb Temp.: 97.20F(Temporal)  Pulse: 72 (Regular)  Resp.: 16 (Unlabored)  BP: 130/80 (Sitting, Left Arm, Standard)     Physical Exam Romie Levee(Cleophas Yoak MD; 03/27/2014 2:42 PM)  General Mental Status-Alert. General Appearance-Cooperative.  Chest and Lung Exam Chest and lung exam reveals -on auscultation, normal breath sounds, no adventitious sounds and normal vocal resonance.  Cardiovascular Cardiovascular examination reveals -normal heart sounds, regular rate and rhythm with no murmurs.  Abdomen Palpation/Percussion Palpation and Percussion of the abdomen reveal - Soft and Non Tender.  Rectal Anorectal Exam External - normal external exam. Internal - Note: Grade 3 right anterior prolapsed internal hemorrhoid.    Assessment & Plan Romie Levee(Arsh Feutz MD; 03/27/2014 2:41 PM)  PROLAPSED INTERNAL HEMORRHOIDS, GRADE 3 (455.2  K64.8) Story: This is a 69 year old female who presents to the office with complaints of abdominal bloating and irregular bowel movements and mucous drainage from her anal canal. Her anal symptoms are getting worse. She would like to proceed with surgery. Impression: We discussed rubber band ligation in the office versus surgical hemorrhoidal pexy for her right anterior grade 3 internal hemorrhoid. She has decided to undergo surgical resection. We  discussed the risk and benefits of this including recurrence, pain and bleeding. I strongly urged her to continue to work on her constipation as this would make her recovery worse if she did not.

## 2014-03-28 ENCOUNTER — Other Ambulatory Visit: Payer: Self-pay | Admitting: Family Medicine

## 2014-03-29 NOTE — Telephone Encounter (Signed)
Med filled.  

## 2014-04-03 ENCOUNTER — Encounter (HOSPITAL_BASED_OUTPATIENT_CLINIC_OR_DEPARTMENT_OTHER): Payer: Self-pay | Admitting: *Deleted

## 2014-04-03 NOTE — Progress Notes (Signed)
NPO AFTER MN. ARRIVE AT [id number]. NEEDS HG. WILL TAKE CELEXA AND PROTONIX AM DOS W/ SIPS OF WATER.

## 2014-04-06 ENCOUNTER — Encounter (HOSPITAL_BASED_OUTPATIENT_CLINIC_OR_DEPARTMENT_OTHER): Payer: Self-pay | Admitting: Anesthesiology

## 2014-04-06 ENCOUNTER — Encounter (HOSPITAL_BASED_OUTPATIENT_CLINIC_OR_DEPARTMENT_OTHER)
Admission: RE | Disposition: A | Discharge status: Home / Self Care | Payer: Self-pay | Source: Ambulatory Visit | Attending: General Surgery

## 2014-04-06 ENCOUNTER — Ambulatory Visit (HOSPITAL_BASED_OUTPATIENT_CLINIC_OR_DEPARTMENT_OTHER)
Admission: RE | Admit: 2014-04-06 | Discharge: 2014-04-06 | Disposition: A | Discharge status: Home / Self Care | Payer: Medicare Other | Source: Ambulatory Visit | Attending: General Surgery | Admitting: General Surgery

## 2014-04-06 ENCOUNTER — Ambulatory Visit (HOSPITAL_BASED_OUTPATIENT_CLINIC_OR_DEPARTMENT_OTHER): Payer: Medicare Other | Admitting: Anesthesiology

## 2014-04-06 DIAGNOSIS — F419 Anxiety disorder, unspecified: Secondary | ICD-10-CM | POA: Diagnosis not present

## 2014-04-06 DIAGNOSIS — M1990 Unspecified osteoarthritis, unspecified site: Secondary | ICD-10-CM | POA: Diagnosis not present

## 2014-04-06 DIAGNOSIS — F329 Major depressive disorder, single episode, unspecified: Secondary | ICD-10-CM | POA: Diagnosis not present

## 2014-04-06 DIAGNOSIS — Z87891 Personal history of nicotine dependence: Secondary | ICD-10-CM | POA: Diagnosis not present

## 2014-04-06 DIAGNOSIS — F1010 Alcohol abuse, uncomplicated: Secondary | ICD-10-CM | POA: Insufficient documentation

## 2014-04-06 DIAGNOSIS — E780 Pure hypercholesterolemia: Secondary | ICD-10-CM | POA: Diagnosis not present

## 2014-04-06 DIAGNOSIS — K620 Anal polyp: Secondary | ICD-10-CM | POA: Diagnosis not present

## 2014-04-06 DIAGNOSIS — K219 Gastro-esophageal reflux disease without esophagitis: Secondary | ICD-10-CM | POA: Diagnosis not present

## 2014-04-06 DIAGNOSIS — Z9089 Acquired absence of other organs: Secondary | ICD-10-CM | POA: Insufficient documentation

## 2014-04-06 DIAGNOSIS — Z862 Personal history of diseases of the blood and blood-forming organs and certain disorders involving the immune mechanism: Secondary | ICD-10-CM | POA: Diagnosis not present

## 2014-04-06 DIAGNOSIS — Z79899 Other long term (current) drug therapy: Secondary | ICD-10-CM | POA: Diagnosis not present

## 2014-04-06 DIAGNOSIS — K642 Third degree hemorrhoids: Secondary | ICD-10-CM | POA: Diagnosis not present

## 2014-04-06 DIAGNOSIS — Z8673 Personal history of transient ischemic attack (TIA), and cerebral infarction without residual deficits: Secondary | ICD-10-CM | POA: Diagnosis not present

## 2014-04-06 DIAGNOSIS — J449 Chronic obstructive pulmonary disease, unspecified: Secondary | ICD-10-CM | POA: Diagnosis not present

## 2014-04-06 DIAGNOSIS — Z8261 Family history of arthritis: Secondary | ICD-10-CM | POA: Insufficient documentation

## 2014-04-06 DIAGNOSIS — K648 Other hemorrhoids: Secondary | ICD-10-CM | POA: Diagnosis not present

## 2014-04-06 HISTORY — DX: Other specified diseases of liver: K76.89

## 2014-04-06 HISTORY — DX: Cyst of pancreas: K86.2

## 2014-04-06 HISTORY — DX: Personal history of colonic polyps: Z86.010

## 2014-04-06 HISTORY — DX: Other constipation: K59.09

## 2014-04-06 HISTORY — DX: Other specified postprocedural states: Z98.890

## 2014-04-06 HISTORY — DX: Personal history of transient ischemic attack (TIA), and cerebral infarction without residual deficits: Z86.73

## 2014-04-06 HISTORY — PX: HEMORRHOID SURGERY: SHX153

## 2014-04-06 HISTORY — DX: Cyst of kidney, acquired: N28.1

## 2014-04-06 HISTORY — DX: Presence of spectacles and contact lenses: Z97.3

## 2014-04-06 HISTORY — DX: Diverticulosis of large intestine without perforation or abscess without bleeding: K57.30

## 2014-04-06 HISTORY — DX: Presence of dental prosthetic device (complete) (partial): Z97.2

## 2014-04-06 HISTORY — DX: Personal history of colon polyps, unspecified: Z86.0100

## 2014-04-06 HISTORY — DX: Emphysema, unspecified: J43.9

## 2014-04-06 HISTORY — DX: Personal history of other infectious and parasitic diseases: Z86.19

## 2014-04-06 HISTORY — DX: Allergic rhinitis, unspecified: J30.9

## 2014-04-06 HISTORY — DX: Other specified postprocedural states: R11.2

## 2014-04-06 HISTORY — DX: Third degree hemorrhoids: K64.2

## 2014-04-06 LAB — POCT HEMOGLOBIN-HEMACUE: Hemoglobin: 10.6 g/dL — ABNORMAL LOW (ref 12.0–15.0)

## 2014-04-06 SURGERY — HEMORRHOIDECTOMY
Anesthesia: Monitor Anesthesia Care | Site: Rectum

## 2014-04-06 MED ORDER — LACTATED RINGERS IV SOLN
INTRAVENOUS | Status: DC
  Administered 2014-04-06: 09:00:00 via INTRAVENOUS
  Filled 2014-04-06: qty 1000

## 2014-04-06 MED ORDER — PROMETHAZINE HCL 25 MG/ML IJ SOLN
6.2500 mg | INTRAMUSCULAR | Status: DC | PRN
  Filled 2014-04-06: qty 1

## 2014-04-06 MED ORDER — PROPOFOL 10 MG/ML IV EMUL
INTRAVENOUS | Status: DC | PRN
  Administered 2014-04-06: 120 ug/kg/min via INTRAVENOUS

## 2014-04-06 MED ORDER — FENTANYL CITRATE 0.05 MG/ML IJ SOLN
25.0000 ug | INTRAMUSCULAR | Status: DC | PRN
  Filled 2014-04-06: qty 1

## 2014-04-06 MED ORDER — MIDAZOLAM HCL 2 MG/2ML IJ SOLN
INTRAMUSCULAR | Status: AC
  Filled 2014-04-06: qty 2

## 2014-04-06 MED ORDER — DEXAMETHASONE SODIUM PHOSPHATE 4 MG/ML IJ SOLN
INTRAMUSCULAR | Status: DC | PRN
  Administered 2014-04-06: 8 mg via INTRAVENOUS

## 2014-04-06 MED ORDER — HYDROCODONE-ACETAMINOPHEN 5-325 MG PO TABS: 1.0000 | ORAL_TABLET | ORAL | Status: DC | PRN

## 2014-04-06 MED ORDER — ACETAMINOPHEN 325 MG PO TABS
650.0000 mg | ORAL_TABLET | ORAL | Status: DC | PRN
  Filled 2014-04-06: qty 2

## 2014-04-06 MED ORDER — ONDANSETRON HCL 4 MG/2ML IJ SOLN
INTRAMUSCULAR | Status: DC | PRN
  Administered 2014-04-06: 4 mg via INTRAVENOUS

## 2014-04-06 MED ORDER — FENTANYL CITRATE 0.05 MG/ML IJ SOLN
INTRAMUSCULAR | Status: AC
Start: 2014-04-06 — End: 2014-04-06
  Filled 2014-04-06: qty 2

## 2014-04-06 MED ORDER — ACETAMINOPHEN 650 MG RE SUPP
650.0000 mg | RECTAL | Status: DC | PRN
  Filled 2014-04-06: qty 1

## 2014-04-06 MED ORDER — OXYCODONE HCL 5 MG PO TABS
5.0000 mg | ORAL_TABLET | ORAL | Status: DC | PRN
  Administered 2014-04-06: 5 mg via ORAL
  Filled 2014-04-06: qty 2

## 2014-04-06 MED ORDER — LIDOCAINE 5 % EX OINT
TOPICAL_OINTMENT | CUTANEOUS | Status: DC | PRN
  Administered 2014-04-06: 1 via TOPICAL

## 2014-04-06 MED ORDER — FENTANYL CITRATE 0.05 MG/ML IJ SOLN
INTRAMUSCULAR | Status: DC | PRN
  Administered 2014-04-06: 50 ug via INTRAVENOUS

## 2014-04-06 MED ORDER — OXYCODONE HCL 5 MG PO TABS
ORAL_TABLET | ORAL | Status: AC
  Filled 2014-04-06: qty 1

## 2014-04-06 MED ORDER — BUPIVACAINE-EPINEPHRINE 0.5% -1:200000 IJ SOLN
INTRAMUSCULAR | Status: DC | PRN
  Administered 2014-04-06: 23 mL

## 2014-04-06 MED ORDER — SODIUM CHLORIDE 0.9 % IJ SOLN
3.0000 mL | INTRAMUSCULAR | Status: DC | PRN
  Filled 2014-04-06: qty 3

## 2014-04-06 MED ORDER — SODIUM CHLORIDE 0.9 % IJ SOLN
3.0000 mL | Freq: Two times a day (BID) | INTRAMUSCULAR | Status: DC
  Filled 2014-04-06: qty 3

## 2014-04-06 MED ORDER — MIDAZOLAM HCL 5 MG/5ML IJ SOLN
INTRAMUSCULAR | Status: DC | PRN
  Administered 2014-04-06: 0.5 mg via INTRAVENOUS

## 2014-04-06 MED ORDER — KETAMINE HCL 100 MG/ML IJ SOLN
250.0000 mg | INTRAMUSCULAR | Status: DC | PRN
  Administered 2014-04-06: 10 ug/kg/min via INTRAVENOUS

## 2014-04-06 MED ORDER — SODIUM CHLORIDE 0.9 % IV SOLN
250.0000 mL | INTRAVENOUS | Status: DC | PRN
  Filled 2014-04-06: qty 250

## 2014-04-06 SURGICAL SUPPLY — 29 items
BLADE HEX COATED 2.75 (ELECTRODE) ×2 IMPLANT
BLADE SURG 10 STRL SS (BLADE) ×2 IMPLANT
BRIEF STRETCH FOR OB PAD LRG (UNDERPADS AND DIAPERS) ×2 IMPLANT
COVER MAYO STAND STRL (DRAPES) ×2 IMPLANT
COVER TABLE BACK 60X90 (DRAPES) ×2 IMPLANT
DRAPE PED LAPAROTOMY (DRAPES) ×2 IMPLANT
DRAPE UTILITY XL STRL (DRAPES) ×2 IMPLANT
DRSG PAD ABDOMINAL 8X10 ST (GAUZE/BANDAGES/DRESSINGS) ×2 IMPLANT
ELECT REM PT RETURN 9FT ADLT (ELECTROSURGICAL) ×2
ELECTRODE REM PT RTRN 9FT ADLT (ELECTROSURGICAL) ×1 IMPLANT
GLOVE BIO SURGEON STRL SZ 6.5 (GLOVE) ×6 IMPLANT
GLOVE BIO SURGEON STRL SZ7.5 (GLOVE) ×2 IMPLANT
GLOVE INDICATOR 6.5 STRL GRN (GLOVE) ×4 IMPLANT
GLOVE INDICATOR 7.0 STRL GRN (GLOVE) ×2 IMPLANT
GOWN STRL REUS W/ TWL LRG LVL3 (GOWN DISPOSABLE) ×2 IMPLANT
GOWN STRL REUS W/TWL 2XL LVL3 (GOWN DISPOSABLE) ×2 IMPLANT
GOWN STRL REUS W/TWL LRG LVL3 (GOWN DISPOSABLE) ×2
NEEDLE HYPO 25X1 1.5 SAFETY (NEEDLE) ×2 IMPLANT
NS IRRIG 500ML POUR BTL (IV SOLUTION) ×2 IMPLANT
PACK BASIN DAY SURGERY FS (CUSTOM PROCEDURE TRAY) ×2 IMPLANT
PAD ABD 8X10 STRL (GAUZE/BANDAGES/DRESSINGS) ×2 IMPLANT
PENCIL BUTTON HOLSTER BLD 10FT (ELECTRODE) ×2 IMPLANT
SPONGE GAUZE 4X4 12PLY STER LF (GAUZE/BANDAGES/DRESSINGS) ×2 IMPLANT
SUT VIC AB 3-0 SH 27 (SUTURE) ×1
SUT VIC AB 3-0 SH 27X BRD (SUTURE) ×1 IMPLANT
SYR CONTROL 10ML LL (SYRINGE) ×2 IMPLANT
TRAY DSU PREP LF (CUSTOM PROCEDURE TRAY) ×2 IMPLANT
TUBE CONNECTING 12X1/4 (SUCTIONS) ×2 IMPLANT
YANKAUER SUCT BULB TIP NO VENT (SUCTIONS) ×2 IMPLANT

## 2014-04-06 NOTE — Anesthesia Postprocedure Evaluation (Signed)
  Anesthesia Post-op Note  Patient: Regina Gonzalez  Procedure(s) Performed: Procedure(s) (LRB): Hemmorhoidopexy with anal canal biopsy (N/A)  Patient Location: PACU  Anesthesia Type: MAC  Level of Consciousness: awake and alert   Airway and Oxygen Therapy: Patient Spontanous Breathing  Post-op Pain: mild  Post-op Assessment: Post-op Vital signs reviewed, Patient's Cardiovascular Status Stable, Respiratory Function Stable, Patent Airway and No signs of Nausea or vomiting  Last Vitals:  Filed Vitals:   04/06/14 1045  BP: 118/48  Pulse: 63  Temp:   Resp: 14    Post-op Vital Signs: stable   Complications: No apparent anesthesia complications

## 2014-04-06 NOTE — Op Note (Signed)
04/06/2014  9:53 AM  PATIENT:  Regina Gonzalez  69 y.o. female  Patient Care Team: Sheliah HatchKatherine E Tabori, MD as PCP - General  PRE-OPERATIVE DIAGNOSIS:  Grade 3 internal hemorroid  POST-OPERATIVE DIAGNOSIS:  Grade 3 internal hemorroid  PROCEDURE:  Hemmorhoidopexy, Anal exam under anesthesia with biopsy  Surgeon(s): Romie LeveeAlicia Ivery Michalski, MD  ASSISTANT: none   ANESTHESIA:   local and MAC  SPECIMEN:  Source of Specimen:  R anterior anal canal polyp  DISPOSITION OF SPECIMEN:  PATHOLOGY  COUNTS:  YES  PLAN OF CARE: Discharge to home after PACU  PATIENT DISPOSITION:  PACU - hemodynamically stable.  INDICATION: 69 year old female with long-standing history of constipation who presents to the office with a bleeding grade 3 right anterior internal hemorrhoid   OR FINDINGS: Right anterior internal hemorrhoid grade 3. Inflammatory polyp suspected.  DESCRIPTION: the patient was identified in the preoperative holding area and taken to the OR where they were laid on the operating room table.  MAC anesthesia was induced without difficulty. The patient was then positioned in prone jackknife position with buttocks gently taped apart.  The patient was then prepped and draped in usual sterile fashion.  SCDs were noted to be in place prior to the initiation of anesthesia. A surgical timeout was performed indicating the correct patient, procedure, positioning and need for preoperative antibiotics.  After this was completed, I placed a rectal block using half percent Marcaine with epinephrine. This was done in the standard radial fashion.  I began with a digital rectal exam.  There were no other abnormal masses. The patient had an associated external skin tag with her right anterior internal hemorrhoid. There was a polyp noted on the hemorrhoid that appeared to be inflammatory. This was sent to pathology for further examination after being excised with Metzenbaum scissors.  I then placed a Hill-Ferguson  anoscope into the anal canal and evaluated this completely.  There were no other hemorrhoid columns identified. There was no fissure identified. I placed a 3-0 Vicryl suture proximally into the anal canal and secured this with a figure-of-eight suture. I then performed hemorrhoidal pexy to the dentate line and tied this to my previous proximal suture. This closed down the hemorrhoid nicely. The external skin tag was reduced. The patient was then awakened from anesthesias and the postanesthesia care unit in stable condition. The sterile dressing was applied. All counts were correct per operating room staff.

## 2014-04-06 NOTE — Discharge Instructions (Addendum)
ANORECTAL SURGERY: POST OP INSTRUCTIONS °1. Take your usually prescribed home medications unless otherwise directed. °2. DIET: During the first few hours after surgery sip on some liquids until you are able to urinate.  It is normal to not urinate for several hours after this surgery.  If you feel uncomfortable, please contact the office for instructions.  After you are able to urinate,you may eat, if you feel like it.  Follow a light bland diet the first 24 hours after arrival home, such as soup, liquids, crackers, etc.  Be sure to include lots of fluids daily (6-8 glasses).  Avoid fast food or heavy meals, as your are more likely to get nauseated.  Eat a low fat diet the next few days after surgery.  Limit caffeine intake to 1-2 servings a day. °3. PAIN CONTROL: °a. Pain is best controlled by a usual combination of several different methods TOGETHER: °i. Muscle relaxation: Soak in a warm bath (or Sitz bath) three times a day and after bowel movements.  Continue to do this until all pain is resolved. °ii. Over the counter pain medication °iii. Prescription pain medication °b. Most patients will experience some swelling and discomfort in the anus/rectal area and incisions.  Heat such as warm towels, sitz baths, warm baths, etc to help relax tight/sore spots and speed recovery.  Some people prefer to use ice, especially in the first couple days after surgery, as it may decrease the pain and swelling, or alternate between ice & heat.  Experiment to what works for you.  Swelling and bruising can take several weeks to resolve.  Pain can take even longer to completely resolve. °c. It is helpful to take an over-the-counter pain medication regularly for the first few weeks.  Choose one of the following that works best for you: °i. Naproxen (Aleve, etc)  Two 220mg tabs twice a day °ii. Ibuprofen (Advil, etc) Three 200mg tabs four times a day (every meal & bedtime) °d. A  prescription for pain medication (such as percocet,  oxycodone, hydrocodone, etc) should be given to you upon discharge.  Take your pain medication as prescribed.  °i. If you are having problems/concerns with the prescription medicine (does not control pain, nausea, vomiting, rash, itching, etc), please call us [phone] to see if we need to switch you to a different pain medicine that will work better for you and/or control your side effect better. °ii. If you need a refill on your pain medication, please contact your pharmacy.  They will contact our office to request authorization. Prescriptions will not be filled after 5 pm or on week-ends. °4. KEEP YOUR BOWELS REGULAR and AVOID CONSTIPATION °a. The goal is one to two soft bowel movements a day.  You should at least have a bowel movement every other day. °b. Avoid getting constipated.  Between the surgery and the pain medications, it is common to experience some constipation. This can be very painful after rectal surgery.  Increasing fluid intake and taking a fiber supplement (such as Metamucil, Citrucel, FiberCon, etc) 1-2 times a day regularly will usually help prevent this problem from occurring.  A stool softener like colace is also recommended.  This can be purchased over the counter at your pharmacy.  You can take it up to 3 times a day.  If you do not have a bowel movement after 24 hrs since your surgery, take one does of milk of magnesia.  If you still haven't had a bowel movement 8-12 hours after   that dose, take another dose.  If you don't have a bowel movement 48 hrs after surgery, purchase a Fleets enema from the drug store and administer gently per package instructions.  If you still are having trouble with your bowel movements after that, please call the office for further instructions. °c. If you develop diarrhea or have many loose bowel movements, simplify your diet to bland foods & liquids for a few days.  Stop any stool softeners and decrease your fiber supplement.  Switching to mild  anti-diarrheal medications (Kayopectate, Pepto Bismol) can help.  If this worsens or does not improve, please call us. ° °5. Wound Care °a. Remove your bandages before your first bowel movement or 8 hours after surgery.     °b. Remove any wound packing material at this tim,e as well.  You do not need to repack the wound unless instructed otherwise.  Wear an absorbent pad or soft cotton gauze in your underwear to catch any drainage and help keep the area clean. You should change this every 2-3 hours while awake. °c. Keep the area clean and dry.  Bathe / shower every day, especially after bowel movements.  Keep the area clean by showering / bathing over the incision / wound.   It is okay to soak an open wound to help wash it.  Wet wipes or showers / gentle washing after bowel movements is often less traumatic than regular toilet paper. °d. You may have some styrofoam-like soft packing in the rectum which will come out with the first bowel movement.  °e. You will often notice bleeding with bowel movements.  This should slow down by the end of the first week of surgery °f. Expect some drainage.  This should slow down, too, by the end of the first week of surgery.  Wear an absorbent pad or soft cotton gauze in your underwear until the drainage stops. °g. Do Not sit on a rubber or pillow ring.  This can make you symptoms worse.  You may sit on a soft pillow if needed.  °6. ACTIVITIES as tolerated:   °a. You may resume regular (light) daily activities beginning the next day--such as daily self-care, walking, climbing stairs--gradually increasing activities as tolerated.  If you can walk 30 minutes without difficulty, it is safe to try more intense activity such as jogging, treadmill, bicycling, low-impact aerobics, swimming, etc. °b. Save the most intensive and strenuous activity for last such as sit-ups, heavy lifting, contact sports, etc  Refrain from any heavy lifting or straining until you are off narcotics for pain  control.   °c. You may drive when you are no longer taking prescription pain medication, you can comfortably sit for long periods of time, and you can safely maneuver your car and apply brakes. °d. You may have sexual intercourse when it is comfortable.  °7. FOLLOW UP in our office °a. Please call [facility] at [phone] to set up an appointment to see your surgeon in the office for a follow-up appointment approximately 3-4 weeks after your surgery. °b. Make sure that you call for this appointment the day you arrive home to insure a convenient appointment time. °10. IF YOU HAVE DISABILITY OR FAMILY LEAVE FORMS, BRING THEM TO THE OFFICE FOR PROCESSING.  DO NOT GIVE THEM TO YOUR DOCTOR. ° ° ° ° °WHEN TO CALL US [phone]: °1. Poor pain control °2. Reactions / problems with new medications (rash/itching, nausea, etc)  °3. Fever over 101.5 F (38.5 C) °4.   Inability to urinate °5. Nausea and/or vomiting °6. Worsening swelling or bruising °7. Continued bleeding from incision. °8. Increased pain, redness, or drainage from the incision ° °The clinic staff is available to answer your questions during regular business hours (8:30am-5pm).  Please don’t hesitate to call and ask to speak to one of our nurses for clinical concerns.   A surgeon from [facility] is always on call at the hospitals °  °If you have a medical emergency, go to the nearest emergency room or call 911. °  ° °[facility] °[address], [address], [place], [place]  [address] ? °MAIN: [phone] ? TOLL FREE: [phone] ? °FAX [phone] °[email or web address] ° ° ° ° ° °Post Anesthesia Home Care Instructions ° °Activity: °Get plenty of rest for the remainder of the day. A responsible adult should stay with you for 24 hours following the procedure.  °For the next 24 hours, DO NOT: °-Drive a car °-Operate machinery °-Drink alcoholic beverages °-Take any medication unless instructed by your  physician °-Make any legal decisions or sign important papers. ° °Meals: °Start with liquid foods such as gelatin or soup. Progress to regular foods as tolerated. Avoid greasy, spicy, heavy foods. If nausea and/or vomiting occur, drink only clear liquids until the nausea and/or vomiting subsides. Call your physician if vomiting continues. ° °Special Instructions/Symptoms: °Your throat may feel dry or sore from the anesthesia or the breathing tube placed in your throat during surgery. If this causes discomfort, gargle with warm salt water. The discomfort should disappear within 24 hours. ° °

## 2014-04-06 NOTE — H&P (View-Only) (Signed)
Regina Gonzalez 03/27/2014 12:05 PM Location: [facility] Patient #: [id number] DOB: 07/15/1945 Married / Language: English / Race: White Female History of Present Illness (Hason Ofarrell MD; 03/27/2014 2:40 PM) Patient words: recheck hems.  The patient is a 68 year old female who presents with a complaint of anal problems. Pt of Dr. Outlaw. She complains of a mucus leakage around her anus. She reports very occasional bleeding. Her colonoscopy performed recently was negative for any other masses or polyps according to the patient. She does have occasional constipation. This has been worse recently. Her bowel habits are irregular. She denies any anal pain except for with excessive wiping. She is currently trying to increase her fiber intake to help with these symptoms but admits to not doing this on a regular basis. Her anal symptoms worsen with her constipation. She also complains of bloating. Other Problems (Gianfranco Araki, MD; 03/27/2014 2:41 PM) Alcohol Abuse Anxiety Disorder Arthritis Cerebrovascular Accident Chronic Obstructive Lung Disease Diverticulosis Gastroesophageal Reflux Disease Hypercholesterolemia PROLAPSED INTERNAL HEMORRHOIDS, GRADE 3 (455.2  K64.8) This is a 68-year-old female who presents to the office with complaints of abdominal bloating and irregular bowel movements and mucous drainage from her anal canal. Her anal symptoms are getting worse. She would like to proceed with surgery.  Past Surgical History (Scheryl Sanborn, MD; 03/27/2014 2:41 PM) Appendectomy Breast Biopsy Bilateral. Oral Surgery Tonsillectomy  Allergies (Gilmer Kaminsky, MD; 03/27/2014 2:41 PM) MetroNIDAZOLE *ANTI-INFECTIVE AGENTS - MISC.* Erythromycin (Acne Aid) *DERMATOLOGICALS*  Medication History (Georgine Wiltse, MD; 03/27/2014 2:41 PM) Medications Reconciled Xanax (0.5MG Tablet, Oral as needed) Active. Citalopram Hydrobromide (20MG Tablet, Oral)  Active. Multivitamin Adults 50+ (Oral daily) Active. Pantoprazole Sodium (40MG Tablet DR, Oral daily) Active. Simvastatin (20MG Tablet, Oral daily) Active.  Social History (Bane Hagy, MD; 03/27/2014 2:41 PM) No drug use Tobacco use Former smoker. Alcohol use Remotely quit alcohol use. Caffeine use Coffee.  Family History (Louvina Cleary, MD; 03/27/2014 2:41 PM) Alcohol Abuse Father. Arthritis Mother. Diabetes Mellitus Brother. Heart Disease Brother. Heart disease in female family member before age 55    Vitals (Christy Moore MA; 03/27/2014 12:05 PM) 03/27/2014 12:05 PM Weight: 115.6 lb Temp.: 97.5F(Temporal)  Pulse: 72 (Regular)  Resp.: 16 (Unlabored)  BP: 130/80 (Sitting, Left Arm, Standard)     Physical Exam (Mayli Covington MD; 03/27/2014 2:42 PM)  General Mental Status-Alert. General Appearance-Cooperative.  Chest and Lung Exam Chest and lung exam reveals -on auscultation, normal breath sounds, no adventitious sounds and normal vocal resonance.  Cardiovascular Cardiovascular examination reveals -normal heart sounds, regular rate and rhythm with no murmurs.  Abdomen Palpation/Percussion Palpation and Percussion of the abdomen reveal - Soft and Non Tender.  Rectal Anorectal Exam External - normal external exam. Internal - Note: Grade 3 right anterior prolapsed internal hemorrhoid.    Assessment & Plan (Kam Rahimi MD; 03/27/2014 2:41 PM)  PROLAPSED INTERNAL HEMORRHOIDS, GRADE 3 (455.2  K64.8) Story: This is a 68-year-old female who presents to the office with complaints of abdominal bloating and irregular bowel movements and mucous drainage from her anal canal. Her anal symptoms are getting worse. She would like to proceed with surgery. Impression: We discussed rubber band ligation in the office versus surgical hemorrhoidal pexy for her right anterior grade 3 internal hemorrhoid. She has decided to undergo surgical resection. We  discussed the risk and benefits of this including recurrence, pain and bleeding. I strongly urged her to continue to work on her constipation as this would make her recovery worse if she did not. 

## 2014-04-06 NOTE — Interval H&P Note (Signed)
History and Physical Interval Note:  04/06/2014 9:04 AM  Regina Gonzalez  has presented today for surgery, with the diagnosis of Grade 3 internal hemorroids  The various methods of treatment have been discussed with the patient and family. After consideration of risks, benefits and other options for treatment, the patient has consented to Hemorrhoidopexy as a surgical intervention .  The patient's history has been reviewed, patient examined, no change in status, stable for surgery.  I have reviewed the patient's chart and labs.  Questions were answered to the patient's satisfaction.     Vanita PandaAlicia C Londynn Sonoda, MD  Colorectal and General Surgery [facility]

## 2014-04-06 NOTE — Transfer of Care (Signed)
Immediate Anesthesia Transfer of Care Note  Patient: Regina Gonzalez  Procedure(s) Performed: Procedure(s) (LRB): Hemmorhoidopexy with anal canal biopsy (N/A)  Patient Location: PACU  Anesthesia Type: MAC  Level of Consciousness: awake, alert , oriented and patient cooperative  Airway & Oxygen Therapy: Patient Spontanous Breathing and Patient connected to face mask oxygen  Post-op Assessment: Report given to PACU RN and Post -op Vital signs reviewed and stable  Post vital signs: Reviewed and stable  Complications: No apparent anesthesia complications

## 2014-04-06 NOTE — Anesthesia Preprocedure Evaluation (Signed)
Anesthesia Evaluation  Patient identified by MRN, date of birth, ID band Patient awake    Reviewed: Allergy & Precautions, NPO status , Patient's Chart, lab work & pertinent test results  History of Anesthesia Complications (+) PONV and history of anesthetic complications  Airway Mallampati: II  TM Distance: >3 FB Neck ROM: Full    Dental no notable dental hx.    Pulmonary shortness of breath, COPDformer smoker,  breath sounds clear to auscultation  Pulmonary exam normal       Cardiovascular Exercise Tolerance: Good negative cardio ROS  Rhythm:Regular Rate:Normal     Neuro/Psych PSYCHIATRIC DISORDERS Anxiety Depression TIA   GI/Hepatic Neg liver ROS, GERD-  Medicated,  Endo/Other  negative endocrine ROS  Renal/GU Renal disease  negative genitourinary   Musculoskeletal  (+) Arthritis -,   Abdominal   Peds negative pediatric ROS (+)  Hematology  (+) anemia ,   Anesthesia Other Findings   Reproductive/Obstetrics negative OB ROS                             Anesthesia Physical Anesthesia Plan  ASA: III  Anesthesia Plan: MAC   Post-op Pain Management:    Induction: Intravenous  Airway Management Planned:   Additional Equipment:   Intra-op Plan:   Post-operative Plan:   Informed Consent: I have reviewed the patients History and Physical, chart, labs and discussed the procedure including the risks, benefits and alternatives for the proposed anesthesia with the patient or authorized representative who has indicated his/her understanding and acceptance.   Dental advisory given  Plan Discussed with: CRNA  Anesthesia Plan Comments:         Anesthesia Quick Evaluation

## 2014-04-07 ENCOUNTER — Encounter (HOSPITAL_BASED_OUTPATIENT_CLINIC_OR_DEPARTMENT_OTHER): Payer: Self-pay | Admitting: General Surgery

## 2014-04-24 ENCOUNTER — Other Ambulatory Visit: Payer: Self-pay | Admitting: Family Medicine

## 2014-04-24 NOTE — Telephone Encounter (Signed)
Med filled and faxed.  

## 2014-04-24 NOTE — Telephone Encounter (Signed)
Last OV 01-09-14 Alprazolam last filled 03-22-14 #30 with 0

## 2014-05-11 ENCOUNTER — Encounter: Payer: Self-pay | Admitting: Family Medicine

## 2014-05-11 ENCOUNTER — Ambulatory Visit (INDEPENDENT_AMBULATORY_CARE_PROVIDER_SITE_OTHER): Payer: Medicare Other | Admitting: Family Medicine

## 2014-05-11 VITALS — BP 130/60 | HR 65 | Temp 98.0°F | Resp 16 | Wt 116.4 lb

## 2014-05-11 DIAGNOSIS — F329 Major depressive disorder, single episode, unspecified: Secondary | ICD-10-CM

## 2014-05-11 DIAGNOSIS — R413 Other amnesia: Secondary | ICD-10-CM | POA: Diagnosis not present

## 2014-05-11 DIAGNOSIS — F32A Depression, unspecified: Secondary | ICD-10-CM

## 2014-05-11 MED ORDER — BUPROPION HCL ER (XL) 150 MG PO TB24: 150.0000 mg | ORAL_TABLET | Freq: Every day | ORAL | Status: DC

## 2014-05-11 NOTE — Progress Notes (Signed)
   Subjective:    Patient ID: Regina Gonzalez, female    DOB: Jul 03, 1945, 69 y.o.   MRN: [id number]  HPI Memory loss- 'it's been going on for awhile'.  'it seems to be worse than in the past'.  Pt is interested in possibly being tested for early dementia.  Pt suffers from chronic depression.  Pt reports lack of interest and motivation.   Review of Systems For ROS see HPI     Objective:   Physical Exam  Constitutional: She is oriented to person, place, and time. She appears well-developed and well-nourished. No distress.  Neurological: She is alert and oriented to person, place, and time.  Skin: Skin is warm and dry.  Psychiatric: She has a normal mood and affect. Her behavior is normal. Thought content normal.  Pt got a 28/30 on her MMSE- only missing 2 of the recall items  Vitals reviewed.         Assessment & Plan:

## 2014-05-11 NOTE — Progress Notes (Signed)
Pre visit review using our clinic review tool, if applicable. No additional management support is needed unless otherwise documented below in the visit note. 

## 2014-05-11 NOTE — Patient Instructions (Signed)
Follow up in 1 month to recheck mood and memory Start the Wellbutrin in addition to the celexa- take in the AM Call with any questions or concerns Hang in there!

## 2014-05-11 NOTE — Assessment & Plan Note (Signed)
New.  Pt is reporting difficulty w/ short term memory loss.  Scored 28/30 on MMSE today- only missing 2 of the recall items.  Suspect that pt is actually suffering from pseudodementia from her worsening depression.  Will adjust depression treatment.  Discussed formal neuropsych testing- pt wants to hold off at this time.  No labs at this point.  Will pursue if memory doesn't improve as mood does.  Will follow closely.

## 2014-05-11 NOTE — Assessment & Plan Note (Signed)
Chronic problem for pt.  Deteriorated.  Suspect this is the cause of pt's memory loss rather than true dementia.  Will add Wellbutrin to pt's current Celexa in attempts to improve mood, increase motivation, and improve focus.

## 2014-05-16 IMAGING — CT CT HEAD W/O CM
1 of 2 series · 13 of 30 positions shown, 17 images · non-contrast
Comparison: None.

CLINICAL DATA: Headache, left facial numbness.

CT HEAD WITHOUT CONTRAST
TECHNIQUE: Contiguous axial images were obtained from the base of
the skull through the vertex without contrast.

[Series 2: brain · axial · 0.47mm/px · z∈[+129,+263]mm · 13 of 28 slices shown, 17 images]
[im 2/28  brain]
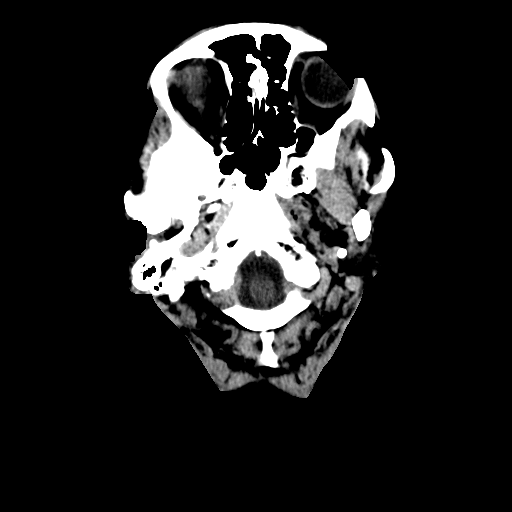
[im 2/28  bone]
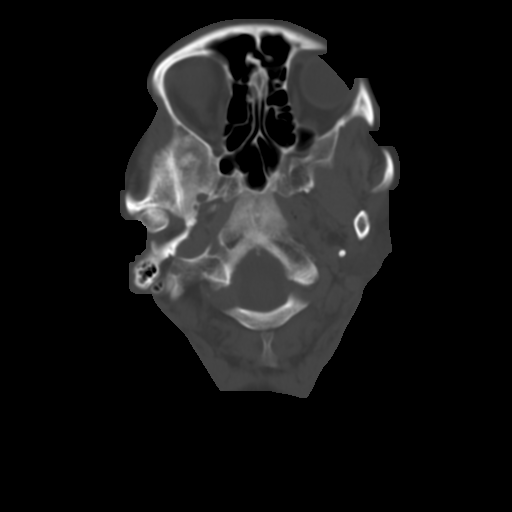
[im 4/28  brain]
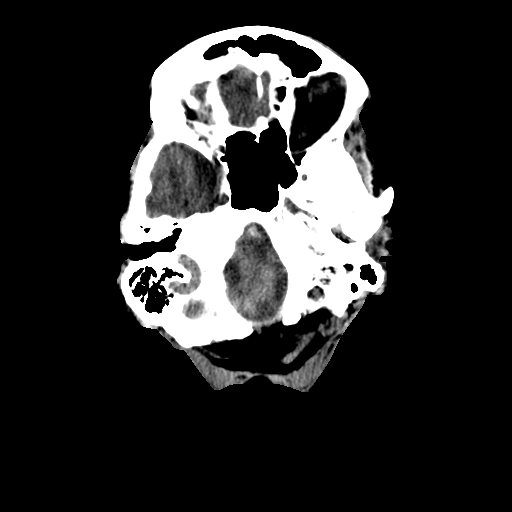
[im 6/28  brain]
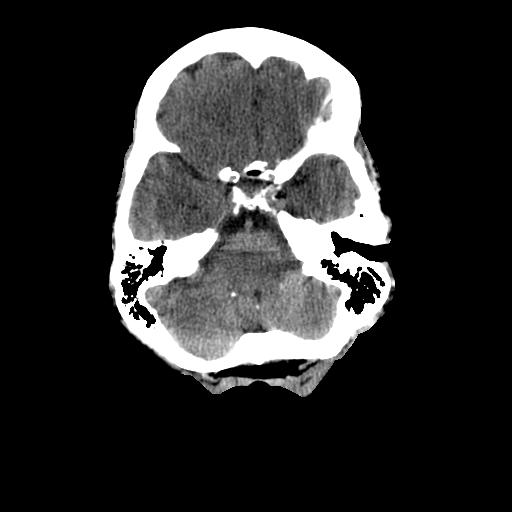
[im 8/28  brain]
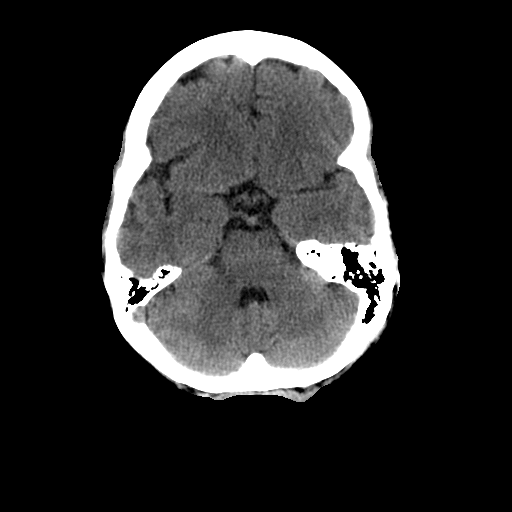
[im 10/28  brain]
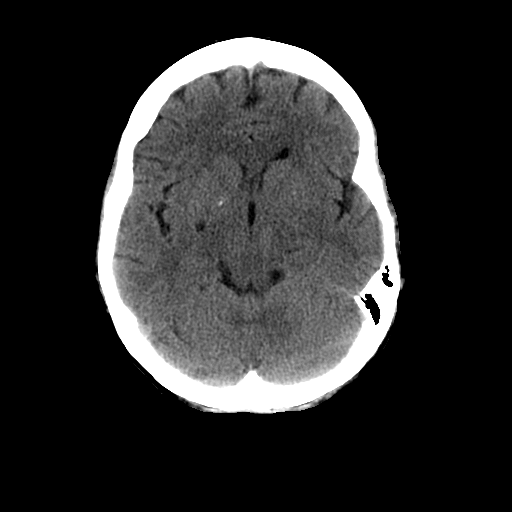
[im 10/28  bone]
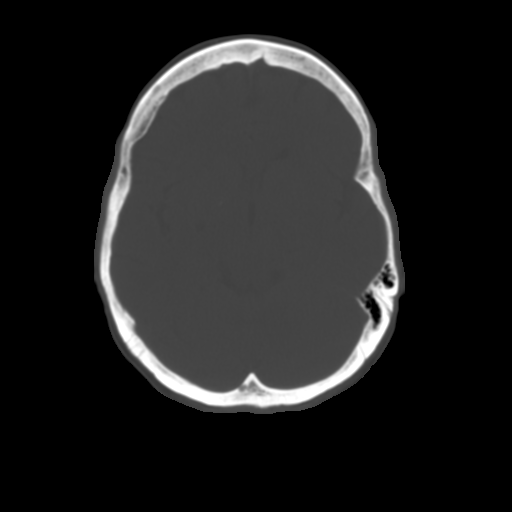
[im 12/28  brain]
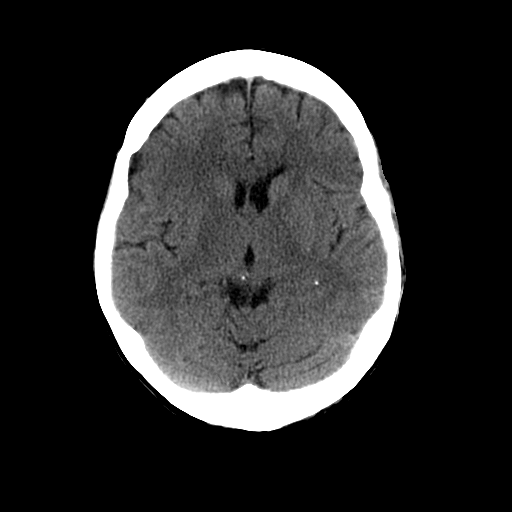
[im 14/28  brain]
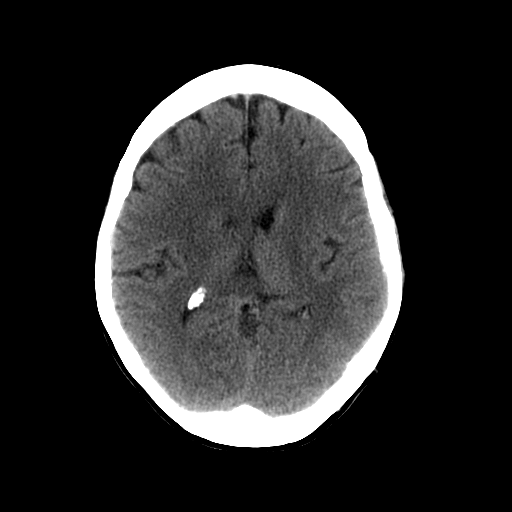
[im 16/28  brain]
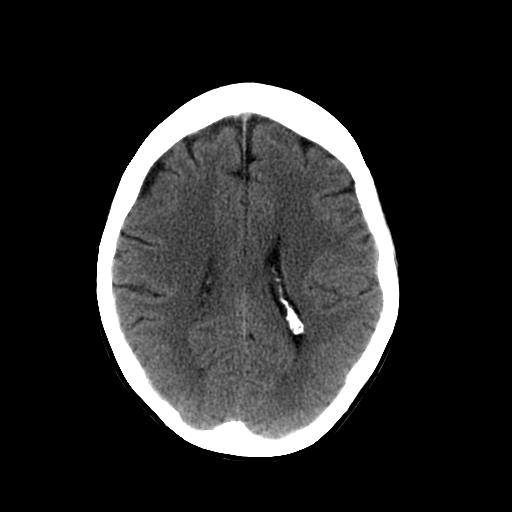
[im 18/28  brain]
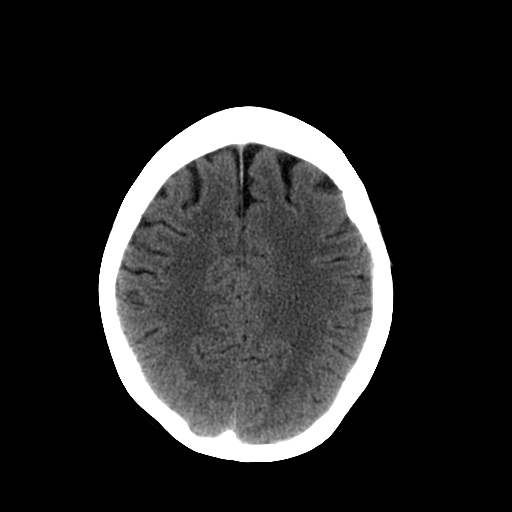
[im 18/28  bone]
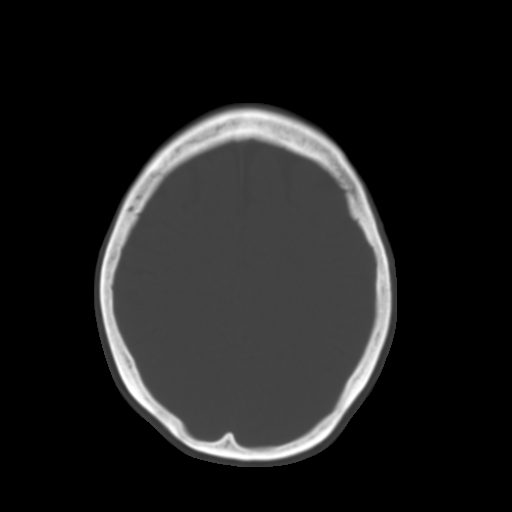
[im 20/28  brain]
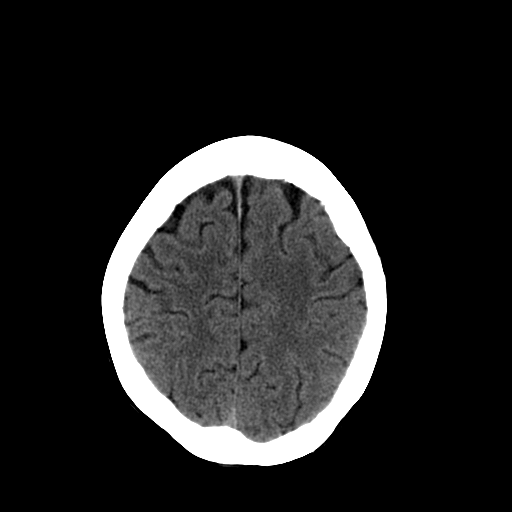
[im 22/28  brain]
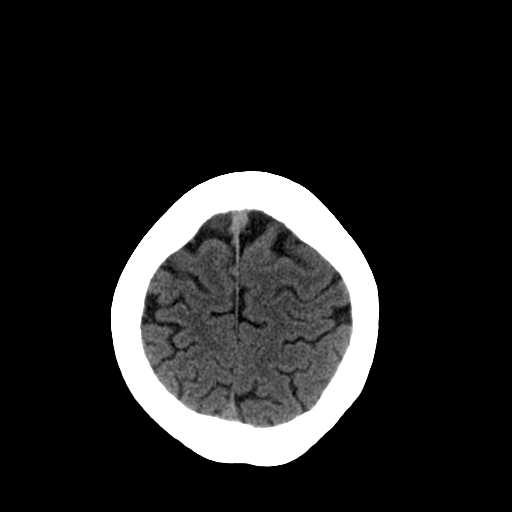
[im 24/28  brain]
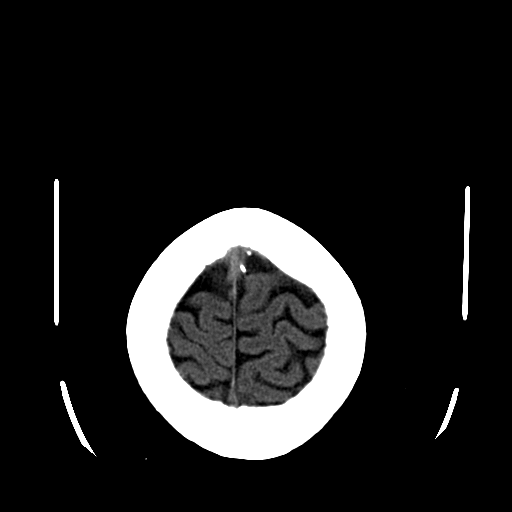
[im 26/28  brain]
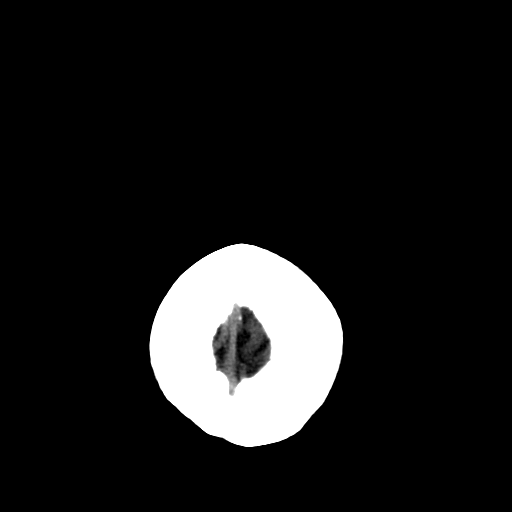
[im 26/28  bone]
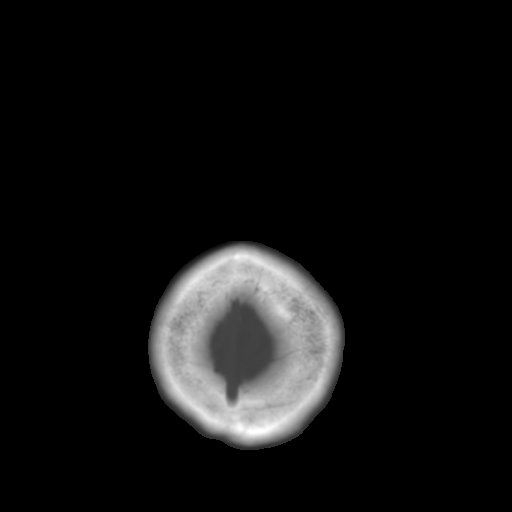

[13 of 30 positions shown; findings below may reference images not displayed]

FINDINGS: No acute intracranial abnormality.  Specifically, no
hemorrhage, hydrocephalus, mass lesion, acute infarction, or
significant intracranial injury.  No acute calvarial abnormality.
Visualized paranasal sinuses and mastoids clear.  Orbital soft
tissues unremarkable.
IMPRESSION: No acute intracranial abnormality.

## 2014-05-21 ENCOUNTER — Other Ambulatory Visit: Payer: Self-pay | Admitting: Family Medicine

## 2014-05-22 NOTE — Telephone Encounter (Signed)
Med filled.  

## 2014-05-31 ENCOUNTER — Other Ambulatory Visit: Payer: Self-pay | Admitting: Family Medicine

## 2014-05-31 NOTE — Telephone Encounter (Signed)
Med filled.  

## 2014-06-15 ENCOUNTER — Ambulatory Visit: Payer: Medicare Other | Admitting: Family Medicine

## 2014-06-19 ENCOUNTER — Ambulatory Visit (INDEPENDENT_AMBULATORY_CARE_PROVIDER_SITE_OTHER): Payer: Medicare Other | Admitting: Family Medicine

## 2014-06-19 ENCOUNTER — Encounter: Payer: Self-pay | Admitting: Family Medicine

## 2014-06-19 VITALS — BP 120/84 | HR 66 | Temp 97.9°F | Resp 16 | Wt 115.4 lb

## 2014-06-19 DIAGNOSIS — F329 Major depressive disorder, single episode, unspecified: Secondary | ICD-10-CM

## 2014-06-19 DIAGNOSIS — F32A Depression, unspecified: Secondary | ICD-10-CM

## 2014-06-19 NOTE — Progress Notes (Signed)
Pre visit review using our clinic review tool, if applicable. No additional management support is needed unless otherwise documented below in the visit note. 

## 2014-06-19 NOTE — Progress Notes (Signed)
   Subjective:    Patient ID: Regina Gonzalez, female    DOB: 02-27-1945, 69 y.o.   MRN: [id number]  HPI Depression- chronic problem, pt reports feeling 'some better' since starting the Wellbutrin at last visit.  Continues the Celexa daily at 40mg .  Still having periods of sadness, 'there are times I think I need a stronger dose but there are times that I want to wean off it'.  Pt has not been worried about the memory since last visit.  Denies CP, palpitations, N/V/D.   Review of Systems For ROS see HPI     Objective:   Physical Exam  Constitutional: She is oriented to person, place, and time. She appears well-developed and well-nourished. No distress.  HENT:  Head: Normocephalic and atraumatic.  Eyes: Conjunctivae and EOM are normal. Pupils are equal, round, and reactive to light.  Neurological: She is alert and oriented to person, place, and time.  Skin: Skin is warm and dry.  Psychiatric: She has a normal mood and affect. Her behavior is normal. Thought content normal.  Vitals reviewed.         Assessment & Plan:

## 2014-06-19 NOTE — Assessment & Plan Note (Signed)
Chronic problem.  Improved since starting Wellbutrin.  No side effects from medication.  Continues to take Celexa.  Will continue current regimen and monitor closely.  Pt expressed understanding and is in agreement w/ plan.

## 2014-06-19 NOTE — Patient Instructions (Signed)
Follow up as scheduled Continue the Wellbutrin daily in addition to Celexa Keep up the good work!  You look great! Call with any questions or concerns Happy Early Birthday!!! Have a great summer!

## 2014-07-06 ENCOUNTER — Ambulatory Visit (INDEPENDENT_AMBULATORY_CARE_PROVIDER_SITE_OTHER): Payer: Medicare Other | Admitting: Medical

## 2014-07-06 ENCOUNTER — Telehealth: Payer: Self-pay | Admitting: Family Medicine

## 2014-07-06 ENCOUNTER — Encounter: Payer: Self-pay | Admitting: Medical

## 2014-07-06 VITALS — BP 137/67 | HR 68 | Temp 98.3°F | Ht 62.0 in | Wt 117.8 lb

## 2014-07-06 DIAGNOSIS — G4489 Other headache syndrome: Secondary | ICD-10-CM

## 2014-07-06 DIAGNOSIS — R202 Paresthesia of skin: Secondary | ICD-10-CM

## 2014-07-06 DIAGNOSIS — R42 Dizziness and giddiness: Secondary | ICD-10-CM

## 2014-07-06 DIAGNOSIS — S0093XA Contusion of unspecified part of head, initial encounter: Secondary | ICD-10-CM | POA: Diagnosis not present

## 2014-07-06 DIAGNOSIS — Z8673 Personal history of transient ischemic attack (TIA), and cerebral infarction without residual deficits: Secondary | ICD-10-CM

## 2014-07-06 NOTE — Telephone Encounter (Signed)
Will you please call and triage pt. I need to know if dizziness, HA, and nausea are from fall or medication

## 2014-07-06 NOTE — Assessment & Plan Note (Addendum)
With variety of intermittent symptoms ha, dizziness, mild confusion,and transeint left upper ext prickly sensation. With your hx of tia. I am going to try to get Ct of your head no contrast tomorrow am. If you have reoccurence of new symptoms over night then ED evaluation.  Call our referal staff by 10 am if no one has update you.  Follow up as needed.  You could try flonase otc to see if that reliefs your faint sinus pressure.

## 2014-07-06 NOTE — Progress Notes (Signed)
Pre visit review using our clinic review tool, if applicable. No additional management support is needed unless otherwise documented below in the visit note. 

## 2014-07-06 NOTE — Telephone Encounter (Signed)
Note to referral staff. 

## 2014-07-06 NOTE — Telephone Encounter (Signed)
Caller name: Germaine PomfretRhonda Kerth Relationship to patient: self Can be reached: [phone]  Reason for call:  Pt just started 2nd month on Wellbutrin. She is having headaches, nausea, and dizziness for the last few days. She feels it may be related to the medication. Pt also states that she hit her head about 3 weeks ago and possibly did something. She would like a call back.

## 2014-07-06 NOTE — Patient Instructions (Signed)
Head contusion With variety of intermittent symptoms ha, dizziness, mild confusion,and transeint left upper ext prickly sensation. With your hx of tia. I am going to try to get Ct of your head no contrast tomorrow am. If you have reoccurence of new symptoms over night then ED evaluation.  Call our referal staff by 10 am if no one has update you.  Follow up as needed.  You could try flonase otc to see if that reliefs your faint sinus pressure.

## 2014-07-06 NOTE — Progress Notes (Signed)
Subjective:    Patient ID: Regina Gonzalez, female    DOB: September 26, 1945, 69 y.o.   MRN: [id number]  HPI  Pt in states 3 wks ago she hit head against microwave door. She had door open. She forgot that door was open and she stood up and hit the door. No loc. No nausea or vomiting. Pt felt mild out of it since. Pt has history of mini stroke 2 yrs ago. Pt not on blood thinner. Pt felt transient faint prickly sensation to arm and face for 2 seconds then subsided. Pt states occasonial shooting pain front of head to back over past month. Random and transient but notes when turns head.   Pt has frontal ha right now mild . Some mild chronic pnd. No recent obvious nasal congestion. Then states when she presses on sinus has some faint pain.  Yesterday faint dizziness for 5 minutes.   Review of Systems  Constitutional: Negative for fever, chills, diaphoresis, activity change and fatigue.  Respiratory: Negative for cough, chest tightness and shortness of breath.   Cardiovascular: Negative for chest pain, palpitations and leg swelling.  Gastrointestinal: Negative for nausea, vomiting and abdominal pain.  Musculoskeletal: Negative for neck pain and neck stiffness.  Neurological: Positive for dizziness and headaches. Negative for tremors, seizures, syncope, facial asymmetry, speech difficulty, weakness, light-headedness and numbness.       None currently but see overall presentation over past 3 wks.  Psychiatric/Behavioral: Negative for behavioral problems, confusion and agitation. The patient is not nervous/anxious.     Past Medical History  Diagnosis Date  . Anxiety   . Depression   . Osteoporosis   . External hemorrhoid   . Hyperlipidemia   . Allergic rhinitis   . GERD (gastroesophageal reflux disease)   . Prolapsed internal hemorrhoids, grade 3   . Diverticulosis of colon   . History of Helicobacter pylori infection   . History of colon polyps   . Simple renal cyst     right   .  Pancreatic cyst     simple benign  . Benign liver cyst     multiple  . Emphysematous COPD   . History of TIA (transient ischemic attack)     apr 2013-- no residual  . PONV (postoperative nausea and vomiting)   . Arthritis     HANDS  . Wears glasses   . Wears partial dentures     UPPER  . Chronic constipation     History   Social History  . Marital Status: Married    Spouse Name: Channing MuttersRoy  . Number of Children: 2  . Years of Education: 12   Occupational History  . [employer]    Social History Main Topics  . Smoking status: Former Smoker -- 1.00 packs/day for 17 years    Types: Cigarettes    Quit date: 04/02/1988  . Smokeless tobacco: Never Used  . Alcohol Use: No  . Drug Use: No  . Sexual Activity: Not on file   Other Topics Concern  . Not on file   Social History Narrative   Patient lives at home wit her husband  Channing Mutters(Roy). works for [employer]. Patient has high school education and two children. Caffeine 2-3 cups daily.    Past Surgical History  Procedure Laterality Date  . Tonsillectomy and adenoidectomy  1955  . Excision bilateral breast cyst  1995  . D & c hysteroscopy  w/ endometrial polypectomy    . Appendectomy  1960  . Esophagogastroduodenoscopy  last one 03-04-2013  . Colonoscopy with propofol  last one 10-25-2013    polypectomy  . Transthoracic echocardiogram  08-13-2012    normal LV/  ef 60-65%/  mild AR/  trivial MR  . Laparoscopic right ovarian cystectomy/  d & c hysteroscopy endometrial polypectomy  2011  . Hemorrhoid surgery N/A 04/06/2014    Procedure: Hemmorhoidopexy with anal canal biopsy;  Surgeon: Romie Levee, MD;  Location: [facility];  Service: General;  Laterality: N/A;    Family History  Problem Relation Age of Onset  . Colon cancer Neg Hx   . Alcoholism Father   . Diabetes type II Brother   . Heart attack Brother   . Multiple sclerosis Daughter     Allergies  Allergen  Reactions  . Erythromycin Other (See Comments)    "severe GI upset, also all "mycins" meds   . Flagyl [Metronidazole] Other (See Comments)    "flu-like symptoms"    Current Outpatient Prescriptions on File Prior to Visit  Medication Sig Dispense Refill  . ALPRAZolam (XANAX) 0.5 MG tablet TAKE ONE OR TWO TABLETS BY MOUTH THREE TIMES DAILY AS NEEDED FOR ANXIETY. 60 tablet 1  . aspirin 81 MG tablet Take 81 mg by mouth once a week.     Marland Kitchen buPROPion (WELLBUTRIN XL) 150 MG 24 hr tablet Take 1 tablet (150 mg total) by mouth daily. 30 tablet 6  . Calcium Carb-Cholecalciferol (CALCIUM 600 + D PO) Take 2 tablets by mouth daily.    . citalopram (CELEXA) 20 MG tablet TAKE TWO TABLETS BY MOUTH DAILY 60 tablet 3  . dicyclomine (BENTYL) 20 MG tablet TAKE ONE TABLET BY MOUTH THREE TIMES DAILY BETWEEN MEALS AS NEEDED 30 tablet 3  . fish oil-omega-3 fatty acids 1000 MG capsule Take 1 g by mouth See admin instructions. Three  times a week    . Multiple Vitamins-Minerals (MULTIVITAMIN WITH MINERALS) tablet Take 1 tablet by mouth 4 (four) times a week. Alternates with Caltrate    . pantoprazole (PROTONIX) 40 MG tablet TAKE ONE TABLET BY MOUTH DAILY 30 tablet 6  . polyethylene glycol (MIRALAX / GLYCOLAX) packet Take 17 g by mouth daily.    . simvastatin (ZOCOR) 20 MG tablet Take 20 mg by mouth daily.     No current facility-administered medications on file prior to visit.    BP 137/67 mmHg  Pulse 68  Temp(Src) 98.3 F (36.8 C) (Oral)  Ht  (1.575 m)  Wt 117 lb 12.8 oz (53.434 kg)  BMI 21.54 kg/m2  SpO2 100%  LMP 06/16/1997       Objective:   Physical Exam  General Mental Status- Alert. General Appearance- Not in acute distress.   Skin General: Color- Normal Color. Moisture- Normal Moisture.  Neck Carotid Arteries- Normal color. Moisture- Normal Moisture. No carotid bruits. No JVD.  Chest and Lung Exam Auscultation: Breath Sounds:-Normal.  Cardiovascular Auscultation:Rythm-  Regular. Murmurs & Other Heart Sounds:Auscultation of the heart reveals- No Murmurs.  Abdomen Inspection:-Inspeection Normal. Palpation/Percussion:Note:No mass. Palpation and Percussion of the abdomen reveal- Non Tender, Non Distended + BS, no rebound or guarding.    Neurologic Cranial Nerve exam:- CN III-XII intact(No nystagmus), symmetric smile. Drift Test:- No drift. Romberg Exam:- Negative.  Heal to Toe Gait exam:-Normal. Finger to Nose:- Normal/Intact Strength:- 5/5 equal and symmetric strength both upper and lower extremities.  Palpation of cranium no step off or swelling.  Assessment & Plan:

## 2014-07-06 NOTE — Telephone Encounter (Signed)
Patient states that "right this minute I am ok."  She reports intermittent dizziness for 2-3 days especially occuring when she turns her head.  She reports that she hit her had on the microwave on the left side 3 weeks ago.  She did not see any bruise, there was no swelling, but she reports pressure behind her left eye.  She reports that she also began Wellbutrin 2 months ago and wonders if it is related to this medication change.  She has also been experiencing nausea when she is dizzy.  No confusion, no blurred vision, no shortness of breath.  She had a dizzy spell yesterday and states she had to sit down and wait it out.  She also states that she had a mini-stroke years ago and it does not feel like that.    Patient scheduled for appointment with Esperanza RichtersEdward Saguier, PA at 6:00 today

## 2014-07-07 ENCOUNTER — Ambulatory Visit (HOSPITAL_BASED_OUTPATIENT_CLINIC_OR_DEPARTMENT_OTHER)
Admission: RE | Admit: 2014-07-07 | Discharge: 2014-07-07 | Disposition: A | Discharge status: Home / Self Care | Payer: Medicare Other | Source: Ambulatory Visit | Attending: Medical | Admitting: Medical

## 2014-07-07 DIAGNOSIS — R42 Dizziness and giddiness: Secondary | ICD-10-CM | POA: Diagnosis not present

## 2014-07-07 DIAGNOSIS — S0990XA Unspecified injury of head, initial encounter: Secondary | ICD-10-CM | POA: Diagnosis not present

## 2014-07-07 DIAGNOSIS — Z8673 Personal history of transient ischemic attack (TIA), and cerebral infarction without residual deficits: Secondary | ICD-10-CM

## 2014-07-07 DIAGNOSIS — G4489 Other headache syndrome: Secondary | ICD-10-CM

## 2014-07-07 DIAGNOSIS — R202 Paresthesia of skin: Secondary | ICD-10-CM

## 2014-07-07 DIAGNOSIS — R51 Headache: Secondary | ICD-10-CM | POA: Insufficient documentation

## 2014-07-07 DIAGNOSIS — R410 Disorientation, unspecified: Secondary | ICD-10-CM | POA: Diagnosis not present

## 2014-07-07 DIAGNOSIS — S0093XA Contusion of unspecified part of head, initial encounter: Secondary | ICD-10-CM

## 2014-07-14 ENCOUNTER — Other Ambulatory Visit: Payer: Self-pay | Admitting: Family Medicine

## 2014-07-14 NOTE — Telephone Encounter (Signed)
Med filled and faxed.  

## 2014-07-14 NOTE — Telephone Encounter (Signed)
Last OV 06-19-14 Alprazolam last filled 04-24-14 #60 with 1

## 2014-08-04 ENCOUNTER — Encounter: Payer: Self-pay | Admitting: Family Medicine

## 2014-08-17 ENCOUNTER — Encounter: Payer: Self-pay | Admitting: Family Medicine

## 2014-09-15 ENCOUNTER — Encounter: Payer: Self-pay | Admitting: Family Medicine

## 2014-09-15 ENCOUNTER — Ambulatory Visit (INDEPENDENT_AMBULATORY_CARE_PROVIDER_SITE_OTHER): Payer: Medicare Other | Admitting: Family Medicine

## 2014-09-15 ENCOUNTER — Other Ambulatory Visit: Payer: Self-pay | Admitting: Family Medicine

## 2014-09-15 VITALS — BP 122/62 | HR 61 | Temp 97.9°F | Resp 16 | Ht 62.0 in | Wt 116.1 lb

## 2014-09-15 DIAGNOSIS — M810 Age-related osteoporosis without current pathological fracture: Secondary | ICD-10-CM

## 2014-09-15 DIAGNOSIS — F32A Depression, unspecified: Secondary | ICD-10-CM

## 2014-09-15 DIAGNOSIS — F329 Major depressive disorder, single episode, unspecified: Secondary | ICD-10-CM

## 2014-09-15 DIAGNOSIS — Z0000 Encounter for general adult medical examination without abnormal findings: Secondary | ICD-10-CM

## 2014-09-15 DIAGNOSIS — D508 Other iron deficiency anemias: Secondary | ICD-10-CM

## 2014-09-15 DIAGNOSIS — Z87891 Personal history of nicotine dependence: Secondary | ICD-10-CM | POA: Diagnosis not present

## 2014-09-15 DIAGNOSIS — E785 Hyperlipidemia, unspecified: Secondary | ICD-10-CM | POA: Diagnosis not present

## 2014-09-15 LAB — CBC WITH DIFFERENTIAL/PLATELET
Basophils Absolute: 0 10*3/uL (ref 0.0–0.1)
Basophils Relative: 1.4 % (ref 0.0–3.0)
Eosinophils Absolute: 0.1 10*3/uL (ref 0.0–0.7)
Eosinophils Relative: 4.4 % (ref 0.0–5.0)
HCT: 31.3 % — ABNORMAL LOW (ref 36.0–46.0)
Hemoglobin: 10.1 g/dL — ABNORMAL LOW (ref 12.0–15.0)
Lymphocytes Relative: 45.2 % (ref 12.0–46.0)
Lymphs Abs: 1.4 10*3/uL (ref 0.7–4.0)
MCHC: 32.2 g/dL (ref 30.0–36.0)
MCV: 71.1 fl — ABNORMAL LOW (ref 78.0–100.0)
Monocytes Absolute: 0.3 10*3/uL (ref 0.1–1.0)
Monocytes Relative: 9.4 % (ref 3.0–12.0)
Neutro Abs: 1.2 10*3/uL — ABNORMAL LOW (ref 1.4–7.7)
Neutrophils Relative %: 39.6 % — ABNORMAL LOW (ref 43.0–77.0)
Platelets: 274 10*3/uL (ref 150.0–400.0)
RBC: 4.4 Mil/uL (ref 3.87–5.11)
RDW: 19.6 % — ABNORMAL HIGH (ref 11.5–15.5)
WBC: 3.1 10*3/uL — ABNORMAL LOW (ref 4.0–10.5)

## 2014-09-15 LAB — HEPATIC FUNCTION PANEL
ALT: 12 U/L (ref 0–35)
AST: 17 U/L (ref 0–37)
Albumin: 4.2 g/dL (ref 3.5–5.2)
Alkaline Phosphatase: 42 U/L (ref 39–117)
Bilirubin, Direct: 0.1 mg/dL (ref 0.0–0.3)
Total Bilirubin: 0.4 mg/dL (ref 0.2–1.2)
Total Protein: 6.6 g/dL (ref 6.0–8.3)

## 2014-09-15 LAB — BASIC METABOLIC PANEL
BUN: 15 mg/dL (ref 6–23)
CO2: 31 mEq/L (ref 19–32)
Calcium: 9.8 mg/dL (ref 8.4–10.5)
Chloride: 105 mEq/L (ref 96–112)
Creatinine, Ser: 1.07 mg/dL (ref 0.40–1.20)
GFR: 54.34 mL/min — ABNORMAL LOW (ref >60.00)
Glucose, Bld: 94 mg/dL (ref 70–99)
Potassium: 4 mEq/L (ref 3.5–5.1)
Sodium: 141 mEq/L (ref 135–145)

## 2014-09-15 LAB — LIPID PANEL
Cholesterol: 240 mg/dL — ABNORMAL HIGH (ref 0–200)
HDL: 81.8 mg/dL (ref >39.00)
LDL Cholesterol: 149 mg/dL — ABNORMAL HIGH (ref 0–99)
NonHDL: 158.58
Total CHOL/HDL Ratio: 3
Triglycerides: 50 mg/dL (ref 0.0–149.0)
VLDL: 10 mg/dL (ref 0.0–40.0)

## 2014-09-15 LAB — VITAMIN D 25 HYDROXY (VIT D DEFICIENCY, FRACTURES): VITD: 45.26 ng/mL (ref 30.00–100.00)

## 2014-09-15 LAB — TSH: TSH: 2.23 u[IU]/mL (ref 0.35–4.50)

## 2014-09-15 NOTE — Progress Notes (Signed)
Pre visit review using our clinic review tool, if applicable. No additional management support is needed unless otherwise documented below in the visit note. 

## 2014-09-15 NOTE — Patient Instructions (Signed)
Follow up in 6 months to recheck cholesterol We'll notify you of your lab results and make any changes if needed We'll call you with your bone density and CT scan You are up to date on colonoscopy until 2025 Keep up the good work!  You look great! Call with any questions or concerns Enjoy the rest of your summer!!!

## 2014-09-15 NOTE — Progress Notes (Signed)
   Subjective:    Patient ID: Regina Gonzalez, female    DOB: November 10, 1945, 69 y.o.   MRN: [id number]  HPI Here today for CPE.  Risk Factors: Hyperlipidemia- chronic problem, on Simvastatin.  Denies abd pain, N/V, myalgias Osteoporosis- chronic problem, due for DEXA Hx of tobacco use- pt smoked for 17+ years and during that time smoked 1-2ppd.  Interested in low dose CT screening. Physical Activity: exercising regularly on treadmill Fall Risk: low Depression: chronic problem, on Celexa and wellbutrin, denies current sxs Hearing: normal to conversational tones and whispered voice at 6 ft ADL's: independent Cognitive: normal linear thought process, memory and attention intact Home Safety: safe at home, lives w/ husband Height, Weight, BMI, Visual Acuity: see vitals, vision corrected to 20/20 w/ glasses Counseling: UTD on colonoscopy, mammo, due for DEXA (at [facility]).  UTD on vaccines. Care team reviewed and updated Labs Ordered: See A&P Care Plan: See A&P    Review of Systems Patient reports no vision/ hearing changes, adenopathy,fever, weight change,  persistant/recurrent hoarseness , swallowing issues, chest pain, palpitations, edema, persistant/recurrent cough, hemoptysis, dyspnea (rest/exertional/paroxysmal nocturnal), gastrointestinal bleeding (melena, rectal bleeding), abdominal pain, significant heartburn, bowel changes, GU symptoms (dysuria, hematuria, incontinence), Gyn symptoms (abnormal  bleeding, pain),  syncope, focal weakness, memory loss, numbness & tingling, skin/hair/nail changes, abnormal bruising or bleeding, anxiety, or depression.     Objective:   Physical Exam General Appearance:    Alert, cooperative, no distress, appears stated age  Head:    Normocephalic, without obvious abnormality, atraumatic  Eyes:    PERRL, conjunctiva/corneas clear, EOM's intact, fundi    benign, both eyes  Ears:    Normal TM's and external ear canals, both ears  Nose:   Nares  normal, septum midline, mucosa normal, no drainage    or sinus tenderness  Throat:   Lips, mucosa, and tongue normal; teeth and gums normal  Neck:   Supple, symmetrical, trachea midline, no adenopathy;    Thyroid: no enlargement/tenderness/nodules  Back:     Symmetric, no curvature, ROM normal, no CVA tenderness  Lungs:     Clear to auscultation bilaterally, respirations unlabored  Chest Wall:    No tenderness or deformity   Heart:    Regular rate and rhythm, S1 and S2 normal, no murmur, rub   or gallop  Breast Exam:    Deferred to mammo  Abdomen:     Soft, non-tender, bowel sounds active all four quadrants,    no masses, no organomegaly  Genitalia:    Deferred  Rectal:    Extremities:   Extremities normal, atraumatic, no cyanosis or edema  Pulses:   2+ and symmetric all extremities  Skin:   Skin color, texture, turgor normal, no rashes or lesions  Lymph nodes:   Cervical, supraclavicular, and axillary nodes normal  Neurologic:   CNII-XII intact, normal strength, sensation and reflexes    throughout          Assessment & Plan:

## 2014-09-15 NOTE — Assessment & Plan Note (Signed)
Chronic problem.  Tolerating statin w/o difficulty.  Check labs.  Adjust meds prn  

## 2014-09-15 NOTE — Assessment & Plan Note (Addendum)
Chronic problem.  Pt is currently asymptomatic.  Doing well.  No changes.

## 2014-09-15 NOTE — Assessment & Plan Note (Signed)
Pt's PE WNL.  UTD on colonoscopy, mammo.  Due for DEXA- order entered.  Written screening schedule updated and given to pt.  Check labs.  Anticipatory guidance provided.  

## 2014-09-15 NOTE — Assessment & Plan Note (Signed)
Pt is interested in CT screening protocol due to 30+ pack year hx.  CT ordered.

## 2014-09-15 NOTE — Assessment & Plan Note (Signed)
Chronic problem.  Check Vit D, get DEXA.  Start meds prn.

## 2014-09-18 ENCOUNTER — Encounter: Payer: Self-pay | Admitting: Family Medicine

## 2014-09-23 ENCOUNTER — Other Ambulatory Visit: Payer: Self-pay | Admitting: Family Medicine

## 2014-09-25 NOTE — Telephone Encounter (Signed)
Medication filled to pharmacy as requested.   

## 2014-09-26 ENCOUNTER — Ambulatory Visit (HOSPITAL_BASED_OUTPATIENT_CLINIC_OR_DEPARTMENT_OTHER)
Admission: RE | Admit: 2014-09-26 | Discharge: 2014-09-26 | Disposition: A | Discharge status: Home / Self Care | Payer: Medicare Other | Source: Ambulatory Visit | Attending: Family Medicine | Admitting: Family Medicine

## 2014-09-26 DIAGNOSIS — F17200 Nicotine dependence, unspecified, uncomplicated: Secondary | ICD-10-CM | POA: Diagnosis not present

## 2014-09-26 DIAGNOSIS — Z87891 Personal history of nicotine dependence: Secondary | ICD-10-CM

## 2014-09-26 DIAGNOSIS — M810 Age-related osteoporosis without current pathological fracture: Secondary | ICD-10-CM | POA: Insufficient documentation

## 2014-09-26 DIAGNOSIS — M8588 Other specified disorders of bone density and structure, other site: Secondary | ICD-10-CM | POA: Diagnosis not present

## 2014-09-30 ENCOUNTER — Encounter: Payer: Self-pay | Admitting: Family Medicine

## 2014-10-03 ENCOUNTER — Other Ambulatory Visit (INDEPENDENT_AMBULATORY_CARE_PROVIDER_SITE_OTHER): Payer: Medicare Other

## 2014-10-03 DIAGNOSIS — D508 Other iron deficiency anemias: Secondary | ICD-10-CM | POA: Diagnosis not present

## 2014-10-03 LAB — FECAL OCCULT BLOOD, IMMUNOCHEMICAL: Fecal Occult Bld: NEGATIVE

## 2014-10-19 ENCOUNTER — Other Ambulatory Visit: Payer: Self-pay

## 2014-10-19 ENCOUNTER — Encounter: Payer: Self-pay | Admitting: Family Medicine

## 2014-10-19 DIAGNOSIS — Z1231 Encounter for screening mammogram for malignant neoplasm of breast: Secondary | ICD-10-CM

## 2014-11-14 DIAGNOSIS — M25569 Pain in unspecified knee: Secondary | ICD-10-CM | POA: Diagnosis not present

## 2014-11-14 DIAGNOSIS — M19042 Primary osteoarthritis, left hand: Secondary | ICD-10-CM | POA: Diagnosis not present

## 2014-11-14 DIAGNOSIS — M7711 Lateral epicondylitis, right elbow: Secondary | ICD-10-CM | POA: Diagnosis not present

## 2014-11-14 DIAGNOSIS — M19041 Primary osteoarthritis, right hand: Secondary | ICD-10-CM | POA: Diagnosis not present

## 2014-11-28 ENCOUNTER — Ambulatory Visit: Payer: Medicare Other

## 2014-12-04 ENCOUNTER — Encounter: Payer: Self-pay | Admitting: Family Medicine

## 2014-12-04 ENCOUNTER — Ambulatory Visit (INDEPENDENT_AMBULATORY_CARE_PROVIDER_SITE_OTHER): Payer: Medicare Other | Admitting: Family Medicine

## 2014-12-04 VITALS — BP 124/82 | HR 67 | Temp 98.0°F | Resp 16 | Wt 118.1 lb

## 2014-12-04 DIAGNOSIS — M7711 Lateral epicondylitis, right elbow: Secondary | ICD-10-CM | POA: Diagnosis not present

## 2014-12-04 DIAGNOSIS — M62838 Other muscle spasm: Secondary | ICD-10-CM

## 2014-12-04 DIAGNOSIS — M6248 Contracture of muscle, other site: Secondary | ICD-10-CM

## 2014-12-04 MED ORDER — CYCLOBENZAPRINE HCL 10 MG PO TABS: 10.0000 mg | ORAL_TABLET | Freq: Every day | ORAL | Status: DC

## 2014-12-04 NOTE — Progress Notes (Signed)
   Subjective:    Patient ID: Regina Gonzalez, female    DOB: 1945/08/17, 69 y.o.   MRN: [id number]  HPI R elbow pain- pt reports she was dx'd w/ 'tendonitis' a couple of weeks ago at [facility] and has been wearing a counterforce strap w/o improvement.  Pt reports pain now radiates from mid forearm up into shoulder and trapezius muscle.  Pt has prednisone injxn scheduled for Wednesday.  Wants to make sure that I agree w/ this plan.   Review of Systems For ROS see HPI     Objective:   Physical Exam  Constitutional: She is oriented to person, place, and time. She appears well-developed and well-nourished. No distress.  HENT:  Head: Normocephalic and atraumatic.  Cardiovascular: Intact distal pulses.   Musculoskeletal: She exhibits tenderness (TTP over R lateral epicondyle). She exhibits no edema (no swelling over R elbow).  + R trap spasm  Neurological: She is alert and oriented to person, place, and time.  Skin: Skin is warm and dry. No erythema.  Psychiatric: She has a normal mood and affect. Her behavior is normal.  Vitals reviewed.         Assessment & Plan:

## 2014-12-04 NOTE — Assessment & Plan Note (Signed)
New.  Explained that her Trap spasm is likely due to her accommodating for the elbow pain.  Start flexeril.  Encouraged heat.  Will follow.

## 2014-12-04 NOTE — Patient Instructions (Signed)
Follow up as needed Use the Flexeril at night as needed for muscle spasm Use heat on the shoulder, ice on the elbow Continue with the Cortisone shot as planned Call with any questions or concerns If you want to join us at the new [place] office, any scheduled appointments will automatically transfer and we will see you at [address], [place], [place] [phone]  Happy Halloween!!!

## 2014-12-04 NOTE — Progress Notes (Signed)
Pre visit review using our clinic review tool, if applicable. No additional management support is needed unless otherwise documented below in the visit note. 

## 2014-12-04 NOTE — Assessment & Plan Note (Signed)
New to provider, ongoing for pt.  She is following w/ [facility] and has injxn scheduled for Wednesday.  Told her that I completely agree w/ this treatment plan and encouraged her to f/u as scheduled.  Showed her proper location for counterforce strap.  Encouraged ice.

## 2014-12-07 DIAGNOSIS — M7711 Lateral epicondylitis, right elbow: Secondary | ICD-10-CM | POA: Diagnosis not present

## 2014-12-07 DIAGNOSIS — M19042 Primary osteoarthritis, left hand: Secondary | ICD-10-CM | POA: Diagnosis not present

## 2014-12-07 DIAGNOSIS — M19041 Primary osteoarthritis, right hand: Secondary | ICD-10-CM | POA: Diagnosis not present

## 2014-12-08 ENCOUNTER — Ambulatory Visit (INDEPENDENT_AMBULATORY_CARE_PROVIDER_SITE_OTHER): Payer: Medicare Other | Admitting: Neurology

## 2014-12-08 ENCOUNTER — Encounter: Payer: Self-pay | Admitting: Neurology

## 2014-12-08 VITALS — BP 129/57 | HR 66 | Ht 62.0 in | Wt 115.0 lb

## 2014-12-08 DIAGNOSIS — M5481 Occipital neuralgia: Secondary | ICD-10-CM

## 2014-12-08 HISTORY — DX: Occipital neuralgia: M54.81

## 2014-12-08 NOTE — Patient Instructions (Signed)
Occipital Neuralgia Occipital neuralgia is a type of headache that causes episodes of very bad pain in the back of your head. Pain from occipital neuralgia may spread (radiate) to other parts of your head. The pain is usually brief and often goes away after you rest and relax. These headaches may be caused by irritation of the nerves that leave your spinal cord high up in your neck, just below the base of your skull (occipital nerves). Your occipital nerves transmit sensations from the back of your head, the top of your head, and the areas behind your ears. CAUSES Occipital neuralgia can occur without any known cause (primary headache syndrome). In other cases, occipital neuralgia is caused by pressure on or irritation of one of the two occipital nerves. Causes of occipital nerve compression or irritation include:  Wear and tear of the vertebrae in the neck (osteoarthritis).  Neck injury.  Disease of the disks that separate the vertebrae.  Tumors.  Gout.  Infections.  Diabetes.  Swollen blood vessels that put pressure on the occipital nerves.  Muscle spasm in the neck. SIGNS AND SYMPTOMS Pain is the main symptom of occipital neuralgia. It usually starts in the back of the head but may also be felt in other areas supplied by the occipital nerves. Pain is usually on one side but may be on both sides. You may have:   Brief episodes of very bad pain that is burning, stabbing, shocking, or shooting.  Pain behind the eye.  Pain triggered by neck movement or hair brushing.  Scalp tenderness.  Aching in the back of the head between episodes of very bad pain. DIAGNOSIS  Your health care provider may diagnose occipital neuralgia based on your symptoms and a physical exam. During the exam, the health care provider may push on areas supplied by the occipital nerves to see if they are painful. Some tests may also be done to help in making the diagnosis. These may include:  Imaging studies of  the upper spinal cord, such as an MRI or CT scan. These may show compression or spinal cord abnormalities.  Nerve block. You will get an injection of numbing medicine (local anesthetic) near the occipital nerve to see if this relieves pain. TREATMENT  Treatment may begin with simple measures, such as:   Rest.  Massage.  Heat.  Over-the-counter pain relievers. If these measures do not work, you may need other treatments, including:  Medicines such as:  Prescription-strength anti-inflammatory medicines.  Muscle relaxants.  Antiseizure medicines.  Antidepressants.  Steroid injection. This involves injections of local anesthetic and strong anti-inflammatory drugs (steroids).  Pulsed radiofrequency. Wires are implanted to deliver electrical impulses that block pain signals from the occipital nerve.  Physical therapy.  Surgery to relieve nerve pressure. HOME CARE INSTRUCTIONS  Take all medicines as directed by your health care provider.  Avoid activities that cause pain.  Rest when you have an attack of pain.  Try gentle massage or a heating pad to relieve pain.  Work with a physical therapist to learn stretching exercises you can do at home.  Try a different pillow or sleeping position.  Practice good posture.  Try to stay active. Get regular exercise that does not cause pain. Ask your health care provider to suggest safe exercises for you.  Keep all follow-up visits as directed by your health care provider. This is important. SEEK MEDICAL CARE IF:  Your medicine is not working.  You have new or worsening symptoms. SEEK IMMEDIATE MEDICAL CARE   IF:  You have very bad head pain that is not going away.  You have a sudden change in vision, balance, or speech. MAKE SURE YOU:  Understand these instructions.  Will watch your condition.  Will get help right away if you are not doing well or get worse.   This information is not intended to replace advice given to  you by your health care provider. Make sure you discuss any questions you have with your health care provider.   Document Released: 01/14/2001 Document Revised: 02/10/2014 Document Reviewed: 01/12/2013 Elsevier Interactive Patient Education 2016 [employer] Inc.  

## 2014-12-08 NOTE — Progress Notes (Signed)
Reason for visit: Headache  Regina Gonzalez is an 69 y.o. female  History of present illness:  Regina Gonzalez is a 69 year old right-handed white female with a history of left occipital headache pains projecting into the frontotemporal areas as well. The patient indicates that the symptoms have worsened over the last several months, she will note sharp jabbing pains that occur in the back of the head, with discomfort going behind the eye on the left. She indicates that some episodes of pain are associated with turning her head to the left. The patient denies any tingling on the head, she occasionally will have some slight sensations of numbness on the left cheek. The patient has been seen last through this office on 07/05/2012, she was reporting similar headaches at that time. The patient denies any discomfort down the arm, she does have some neck and shoulder discomfort at times. The patient denies any weakness, balance changes, or changes in bowel or bladder function. She returns for an evaluation.  Past Medical History  Diagnosis Date  . Anxiety   . Depression   . Osteoporosis   . External hemorrhoid   . Hyperlipidemia   . Allergic rhinitis   . GERD (gastroesophageal reflux disease)   . Prolapsed internal hemorrhoids, grade 3   . Diverticulosis of colon   . History of Helicobacter pylori infection   . History of colon polyps   . Simple renal cyst     right   . Pancreatic cyst     simple benign  . Benign liver cyst     multiple  . Emphysematous COPD (HCC)   . History of TIA (transient ischemic attack)     apr 2013-- no residual  . PONV (postoperative nausea and vomiting)   . Arthritis     HANDS  . Wears glasses   . Wears partial dentures     UPPER  . Chronic constipation   . Occipital neuralgia of left side 12/08/2014    Past Surgical History  Procedure Laterality Date  . Tonsillectomy and adenoidectomy  1955  . Excision bilateral breast cyst  1995  . D & c  hysteroscopy w/ endometrial polypectomy    . Appendectomy  1960  . Esophagogastroduodenoscopy  last one 03-04-2013  . Colonoscopy with propofol  last one 10-25-2013    polypectomy  . Transthoracic echocardiogram  08-13-2012    normal LV/  ef 60-65%/  mild AR/  trivial MR  . Laparoscopic right ovarian cystectomy/  d & c hysteroscopy endometrial polypectomy  2011  . Hemorrhoid surgery N/A 04/06/2014    Procedure: Hemmorhoidopexy with anal canal biopsy;  Surgeon: Romie Levee, MD;  Location: [facility];  Service: General;  Laterality: N/A;    Family History  Problem Relation Age of Onset  . Colon cancer Neg Hx   . Alcoholism Father   . Diabetes type II Brother   . Heart attack Brother   . Multiple sclerosis Daughter     Social history:  reports that she quit smoking about 26 years ago. Her smoking use included Cigarettes. She has a 17 pack-year smoking history. She has never used smokeless tobacco. She reports that she does not drink alcohol or use illicit drugs.    Allergies  Allergen Reactions  . Erythromycin Other (See Comments)    "severe GI upset, also all "mycins" meds   . Flagyl [Metronidazole] Other (See Comments)    "flu-like symptoms"    Medications:  Prior to Admission medications  Medication Sig Start Date End Date Taking? Authorizing Provider  ALPRAZolam (XANAX) 0.5 MG tablet TAKE 1 TO 2 TABLETS BY MOUTH 3 TIMES A DAY AS NEEDED FOR ANXIETY Patient taking differently: 1/2 TABLET AT BEDTIME 07/14/14  Yes Sheliah HatchKatherine E Tabori, MD  aspirin 81 MG tablet Take 81 mg by mouth once a week.    Yes Historical Provider, MD  buPROPion (WELLBUTRIN XL) 150 MG 24 hr tablet Take 1 tablet (150 mg total) by mouth daily. 05/11/14  Yes Sheliah HatchKatherine E Tabori, MD  Calcium Carb-Cholecalciferol (CALCIUM 600 + D PO) Take 2 tablets by mouth daily.   Yes Historical Provider, MD  citalopram (CELEXA) 20 MG tablet TAKE TWO TABLETS BY MOUTH DAILY Patient taking differently: TAKE ONE AND A  HALF TABLETS BY MOUTH DAILY 09/25/14  Yes Sheliah HatchKatherine E Tabori, MD  cyanocobalamin 500 MCG tablet Take 500 mcg by mouth daily.   Yes Historical Provider, MD  Docusate Calcium (STOOL SOFTENER PO) Take by mouth.   Yes Historical Provider, MD  fish oil-omega-3 fatty acids 1000 MG capsule Take 1 g by mouth See admin instructions. Three  times a week   Yes Historical Provider, MD  Multiple Vitamins-Minerals (MULTIVITAMIN WITH MINERALS) tablet Take 1 tablet by mouth 4 (four) times a week. Alternates with Caltrate   Yes Historical Provider, MD  pantoprazole (PROTONIX) 40 MG tablet TAKE ONE TABLET BY MOUTH DAILY 05/22/14  Yes Sheliah HatchKatherine E Tabori, MD  polyethylene glycol (MIRALAX / GLYCOLAX) packet Take 17 g by mouth daily.    Historical Provider, MD    ROS:  Out of a complete 14 system review of symptoms, the patient complains only of the following symptoms, and all other reviewed systems are negative.  Eye itching Constipation Daytime sleepiness Joint pain Itching Memory loss  Blood pressure 129/57, pulse 66, height 5\' 2"  (1.575 m), weight 115 lb (52.164 kg), last menstrual period 06/16/1997.  Physical Exam  General: The patient is alert and cooperative at the time of the examination.  Neuromuscular: Range of movement of the cervical spine was full.  Skin: No significant peripheral edema is noted.   Neurologic Exam  Mental status: The patient is alert and oriented x 3 at the time of the examination. The patient has apparent normal recent and remote memory, with an apparently normal attention span and concentration ability.   Cranial nerves: Facial symmetry is present. Speech is normal, no aphasia or dysarthria is noted. Extraocular movements are full. Visual fields are full.  Motor: The patient has good strength in all 4 extremities.  Sensory examination: Soft touch sensation is symmetric on the face, arms, and legs.  Coordination: The patient has good finger-nose-finger and  heel-to-shin bilaterally.  Gait and station: The patient has a normal gait. Tandem gait is normal. Romberg is negative. No drift is seen.  Reflexes: Deep tendon reflexes are symmetric.   Assessment/Plan:  1. Left occipital neuralgia  The patient reports onset of some sharp jabbing pains in the left occipital area projecting for with retro-orbital pain. Her description is quite consistent with occipital neuralgia. The patient was having some similar headaches 2 years ago. I have indicated that if the episodes are frequent, we can initiate oral medications, and consider occipital nerve injections. The patient does not wish to initiate therapy at this time. She will contact me if she desires treatment.  Marlan Palau. Keith Willis MD 12/08/2014 3:42 PM  [facility] [address] [place], [place] [id number]  Phone [phone] Fax [phone]

## 2014-12-17 ENCOUNTER — Other Ambulatory Visit: Payer: Self-pay | Admitting: Family Medicine

## 2014-12-18 ENCOUNTER — Telehealth: Payer: Self-pay | Admitting: *Deleted

## 2014-12-18 NOTE — Telephone Encounter (Signed)
Medication filled to pharmacy as requested.   

## 2014-12-18 NOTE — Telephone Encounter (Signed)
Called and spoke with the pt and informed her for recent (Negative) hemoccult cards results.  Pt verbalized understanding.//AB/CMA

## 2014-12-20 ENCOUNTER — Telehealth: Payer: Self-pay | Admitting: Acute Care

## 2014-12-20 ENCOUNTER — Ambulatory Visit
Admission: RE | Admit: 2014-12-20 | Discharge: 2014-12-20 | Disposition: A | Discharge status: Home / Self Care | Payer: Medicare Other | Source: Ambulatory Visit

## 2014-12-20 DIAGNOSIS — Z1231 Encounter for screening mammogram for malignant neoplasm of breast: Secondary | ICD-10-CM

## 2014-12-20 NOTE — Telephone Encounter (Signed)
Called and Spoke to patient.  Patient does not qualify for the program. Patient quit smoking in 1990. Per the guidelines, patient would have needed to be smoking no less than 15 years ago. Although we do not agree with this, everyone should be screened, We do have to follow CMS guidelines.   Will inform Dr. Beverely Lowabori of this.  Nothing further needed at this time.

## 2014-12-25 ENCOUNTER — Other Ambulatory Visit: Payer: Self-pay | Admitting: Family Medicine

## 2014-12-25 NOTE — Telephone Encounter (Signed)
Medication filled to pharmacy as requested.   

## 2014-12-30 ENCOUNTER — Other Ambulatory Visit: Payer: Self-pay | Admitting: Family Medicine

## 2015-01-01 NOTE — Telephone Encounter (Signed)
Medication filled to pharmacy as requested.   

## 2015-01-01 NOTE — Telephone Encounter (Signed)
Last OV 12/04/14 Alprazolam last filled 07/14/14 #60 with 1

## 2015-01-24 ENCOUNTER — Encounter: Payer: Self-pay | Admitting: Family Medicine

## 2015-01-24 ENCOUNTER — Ambulatory Visit (INDEPENDENT_AMBULATORY_CARE_PROVIDER_SITE_OTHER): Payer: Medicare Other | Admitting: Family Medicine

## 2015-01-24 ENCOUNTER — Other Ambulatory Visit: Payer: Self-pay | Admitting: Family Medicine

## 2015-01-24 VITALS — BP 122/80 | HR 76 | Temp 98.0°F | Resp 16 | Ht 62.0 in | Wt 118.0 lb

## 2015-01-24 DIAGNOSIS — R5383 Other fatigue: Secondary | ICD-10-CM | POA: Diagnosis not present

## 2015-01-24 DIAGNOSIS — D649 Anemia, unspecified: Secondary | ICD-10-CM

## 2015-01-24 LAB — BASIC METABOLIC PANEL
BUN: 16 mg/dL (ref 6–23)
CO2: 30 mEq/L (ref 19–32)
Calcium: 9.7 mg/dL (ref 8.4–10.5)
Chloride: 104 mEq/L (ref 96–112)
Creatinine, Ser: 1.11 mg/dL (ref 0.40–1.20)
GFR: 52.03 mL/min — ABNORMAL LOW (ref >60.00)
Glucose, Bld: 68 mg/dL — ABNORMAL LOW (ref 70–99)
Potassium: 4.2 mEq/L (ref 3.5–5.1)
Sodium: 141 mEq/L (ref 135–145)

## 2015-01-24 LAB — CBC WITH DIFFERENTIAL/PLATELET
Basophils Absolute: 0 10*3/uL (ref 0.0–0.1)
Basophils Relative: 0 % (ref 0.0–3.0)
Eosinophils Absolute: 0.2 10*3/uL (ref 0.0–0.7)
Eosinophils Relative: 6.9 % — ABNORMAL HIGH (ref 0.0–5.0)
HCT: 29.3 % — ABNORMAL LOW (ref 36.0–46.0)
Hemoglobin: 8.9 g/dL — ABNORMAL LOW (ref 12.0–15.0)
Lymphocytes Relative: 34.7 % (ref 12.0–46.0)
Lymphs Abs: 1.2 10*3/uL (ref 0.7–4.0)
MCHC: 30.5 g/dL (ref 30.0–36.0)
MCV: 67.2 fl — ABNORMAL LOW (ref 78.0–100.0)
Monocytes Absolute: 0.3 10*3/uL (ref 0.1–1.0)
Monocytes Relative: 10.5 % (ref 3.0–12.0)
Neutro Abs: 1.6 10*3/uL (ref 1.4–7.7)
Neutrophils Relative %: 47.9 % (ref 43.0–77.0)
Platelets: 299 10*3/uL (ref 150.0–400.0)
RBC: 4.36 Mil/uL (ref 3.87–5.11)
RDW: 20.3 % — ABNORMAL HIGH (ref 11.5–15.5)
WBC: 3.3 10*3/uL — ABNORMAL LOW (ref 4.0–10.5)

## 2015-01-24 LAB — HEPATIC FUNCTION PANEL
ALT: 12 U/L (ref 0–35)
AST: 17 U/L (ref 0–37)
Albumin: 4 g/dL (ref 3.5–5.2)
Alkaline Phosphatase: 44 U/L (ref 39–117)
Bilirubin, Direct: 0 mg/dL (ref 0.0–0.3)
Total Bilirubin: 0.3 mg/dL (ref 0.2–1.2)
Total Protein: 6.3 g/dL (ref 6.0–8.3)

## 2015-01-24 LAB — TSH: TSH: 2.38 u[IU]/mL (ref 0.35–4.50)

## 2015-01-24 MED ORDER — FERROUS SULFATE 325 (65 FE) MG PO TABS: 325.0000 mg | ORAL_TABLET | Freq: Every day | ORAL | Status: DC

## 2015-01-24 NOTE — Assessment & Plan Note (Signed)
Pt has fatigue, body aches, and some shortness of breath w/ exertion but no evidence of bacterial infxn or abnormality on PE.  Suspect that this is a viral illness and should resolve w/ time and supportive care but will check labs to r/o underlying causes of fatigue- including illness, thyroid abnormality, or electrolyte disturbance.  Reviewed supportive care and red flags that should prompt return.  Pt expressed understanding and is in agreement w/ plan.

## 2015-01-24 NOTE — Patient Instructions (Signed)
Follow up as needed We'll notify you of your lab results and make any changes if needed Drink plenty of fluids REST! Call with any questions or concerns If you want to join [place] at the new [place], any scheduled appointments will automatically transfer and we will see you at [address], [place], [place] [id number] (OPENING 02/06/15) Happy Holidays!

## 2015-01-24 NOTE — Progress Notes (Signed)
Pre visit review using our clinic review tool, if applicable. No additional management support is needed unless otherwise documented below in the visit note. 

## 2015-01-24 NOTE — Progress Notes (Signed)
   Subjective:    Patient ID: Regina Gonzalez, female    DOB: December 25, 1945, 69 y.o.   MRN: [id number]  HPI 'i think i've just got the crud'- 'i've been so winded lately'.  sxs started 6 days ago w/ muscle aches, fatigue, SOB.  Minimal cough.  No fever, N/V/D.  Denies sore throat, ear pain.  Some frontal sinus pain.  + HA.  No known sick contacts.   Review of Systems For ROS see HPI     Objective:   Physical Exam  Constitutional: She is oriented to person, place, and time. She appears well-developed and well-nourished. No distress.  HENT:  Head: Normocephalic and atraumatic.  Right Ear: Tympanic membrane normal.  Left Ear: Tympanic membrane normal.  Nose: Mucosal edema and rhinorrhea present. Right sinus exhibits no maxillary sinus tenderness and no frontal sinus tenderness. Left sinus exhibits no maxillary sinus tenderness and no frontal sinus tenderness.  Mouth/Throat: Mucous membranes are normal. Posterior oropharyngeal erythema (w/ PND) present.  Eyes: Conjunctivae and EOM are normal. Pupils are equal, round, and reactive to light.  Neck: Normal range of motion. Neck supple.  Cardiovascular: Normal rate, regular rhythm and normal heart sounds.   Pulmonary/Chest: Effort normal and breath sounds normal. No respiratory distress. She has no wheezes. She has no rales.  Lymphadenopathy:    She has no cervical adenopathy.  Neurological: She is alert and oriented to person, place, and time.  Skin: Skin is warm and dry. No erythema.  Psychiatric: She has a normal mood and affect. Her behavior is normal. Thought content normal.  Vitals reviewed.         Assessment & Plan:

## 2015-02-07 ENCOUNTER — Other Ambulatory Visit (INDEPENDENT_AMBULATORY_CARE_PROVIDER_SITE_OTHER): Payer: Medicare Other

## 2015-02-07 DIAGNOSIS — D649 Anemia, unspecified: Secondary | ICD-10-CM | POA: Diagnosis not present

## 2015-02-07 LAB — FECAL OCCULT BLOOD, IMMUNOCHEMICAL: Fecal Occult Bld: NEGATIVE

## 2015-03-08 ENCOUNTER — Other Ambulatory Visit: Payer: Self-pay

## 2015-03-08 MED ORDER — BUPROPION HCL ER (XL) 150 MG PO TB24: 150.0000 mg | ORAL_TABLET | Freq: Every day | ORAL | Status: DC

## 2015-03-13 ENCOUNTER — Other Ambulatory Visit: Payer: Self-pay | Admitting: Family Medicine

## 2015-03-13 NOTE — Telephone Encounter (Signed)
Medication filled to pharmacy as requested.   

## 2015-03-14 ENCOUNTER — Encounter: Payer: Self-pay | Admitting: Family Medicine

## 2015-03-15 ENCOUNTER — Encounter: Payer: Self-pay | Admitting: Family Medicine

## 2015-03-15 ENCOUNTER — Ambulatory Visit (INDEPENDENT_AMBULATORY_CARE_PROVIDER_SITE_OTHER): Payer: Medicare Other | Admitting: Family Medicine

## 2015-03-15 ENCOUNTER — Ambulatory Visit: Payer: Medicare Other | Admitting: Family Medicine

## 2015-03-15 VITALS — BP 122/60 | HR 84 | Temp 98.3°F | Ht 62.0 in | Wt 116.8 lb

## 2015-03-15 DIAGNOSIS — N8110 Cystocele, unspecified: Secondary | ICD-10-CM | POA: Diagnosis not present

## 2015-03-15 NOTE — Patient Instructions (Signed)
Follow up as needed We'll call you with your urology appt Call with any questions or concerns If you want to join [place] at the new [place] office, any scheduled appointments will automatically transfer and we will see you at [address] [place] [address] [place], [place] [address] [facility]) Happy Valentine's Day!!!

## 2015-03-15 NOTE — Progress Notes (Signed)
Pre visit review using our clinic review tool, if applicable. No additional management support is needed unless otherwise documented below in the visit note. 

## 2015-03-15 NOTE — Progress Notes (Signed)
   Subjective:    Patient ID: Regina Gonzalez, female    DOB: [birth year], 70 y.o.   MRN: [id number]  HPI Bladder drop- 'it feels different in the vaginal area'.  Pt reports vaginal opening seems smaller.  Denies bladder leakage.  Pt reports urinary stream has changed over the last month- 'it has to go around or over something'.   Review of Systems For ROS see HPI     Objective:   Physical Exam  Constitutional: She is oriented to person, place, and time. She appears well-developed and well-nourished. No distress.  HENT:  Head: Normocephalic and atraumatic.  Genitourinary:  Pt w/ mildly prolapsed bladder w/ angled urethra  Neurological: She is alert and oriented to person, place, and time.  Psychiatric: She has a normal mood and affect. Her behavior is normal. Thought content normal.  Vitals reviewed.         Assessment & Plan:

## 2015-03-16 NOTE — Assessment & Plan Note (Signed)
New.  Pt's sxs and PE consistent w/ mild bladder prolapse.  Refer to urology for complete evaluation and tx.  Will follow along.

## 2015-03-20 ENCOUNTER — Encounter: Payer: Self-pay | Admitting: Family Medicine

## 2015-03-23 ENCOUNTER — Other Ambulatory Visit: Payer: Self-pay | Admitting: Family Medicine

## 2015-03-26 NOTE — Telephone Encounter (Signed)
Medication filled to pharmacy as requested.   

## 2015-04-10 ENCOUNTER — Ambulatory Visit: Payer: Medicare Other | Admitting: Podiatry

## 2015-04-12 DIAGNOSIS — K6289 Other specified diseases of anus and rectum: Secondary | ICD-10-CM | POA: Diagnosis not present

## 2015-04-23 DIAGNOSIS — Z0000 Encounter for general adult medical examination without abnormal findings: Secondary | ICD-10-CM | POA: Diagnosis not present

## 2015-04-23 DIAGNOSIS — R351 Nocturia: Secondary | ICD-10-CM | POA: Diagnosis not present

## 2015-04-23 DIAGNOSIS — R3912 Poor urinary stream: Secondary | ICD-10-CM | POA: Diagnosis not present

## 2015-04-23 DIAGNOSIS — N8111 Cystocele, midline: Secondary | ICD-10-CM | POA: Diagnosis not present

## 2015-04-30 ENCOUNTER — Ambulatory Visit (INDEPENDENT_AMBULATORY_CARE_PROVIDER_SITE_OTHER): Payer: Medicare Other | Admitting: Family Medicine

## 2015-04-30 ENCOUNTER — Encounter: Payer: Self-pay | Admitting: Family Medicine

## 2015-04-30 VITALS — BP 122/68 | HR 78 | Temp 97.9°F | Resp 17 | Wt 118.4 lb

## 2015-04-30 DIAGNOSIS — B9689 Other specified bacterial agents as the cause of diseases classified elsewhere: Secondary | ICD-10-CM

## 2015-04-30 DIAGNOSIS — J019 Acute sinusitis, unspecified: Secondary | ICD-10-CM

## 2015-04-30 MED ORDER — AMOXICILLIN 875 MG PO TABS: 875.0000 mg | ORAL_TABLET | Freq: Two times a day (BID) | ORAL | Status: DC

## 2015-04-30 NOTE — Progress Notes (Signed)
   Subjective:    Patient ID: Regina Gonzalez, female    DOB: Apr 22, 1945, 70 y.o.   MRN: [id number]  HPI URI- pt having difficulty breathing at night.  + green nasal drainage w/ intermittent blood.  sxs started 'a few days ago'.  Taking OTC cough and cold meds w/o relief.  + facial pain.  No HA.  R ear fullness.  sxs are predominately R sided.  + PND and cough.  No SOB, wheezing.  No fevers.  + sick contacts.     Review of Systems For ROS see HPI     Objective:   Physical Exam  Constitutional: She appears well-developed and well-nourished. No distress.  HENT:  Head: Normocephalic and atraumatic.  Right Ear: Tympanic membrane normal.  Left Ear: Tympanic membrane normal.  Nose: Mucosal edema and rhinorrhea present. Right sinus exhibits maxillary sinus tenderness and frontal sinus tenderness. Left sinus exhibits maxillary sinus tenderness and frontal sinus tenderness.  Mouth/Throat: Uvula is midline and mucous membranes are normal. Posterior oropharyngeal erythema present. No oropharyngeal exudate.  Eyes: Conjunctivae and EOM are normal. Pupils are equal, round, and reactive to light.  Neck: Normal range of motion. Neck supple.  Cardiovascular: Normal rate, regular rhythm and normal heart sounds.   Pulmonary/Chest: Effort normal and breath sounds normal. No respiratory distress. She has no wheezes.  Lymphadenopathy:    She has no cervical adenopathy.  Vitals reviewed.         Assessment & Plan:

## 2015-04-30 NOTE — Progress Notes (Signed)
Pre visit review using our clinic review tool, if applicable. No additional management support is needed unless otherwise documented below in the visit note. 

## 2015-04-30 NOTE — Patient Instructions (Signed)
Follow up as needed Start the Amoxicillin twice daily- take w/ food Drink plenty of fluids REST!!! Start Claritin or Zyrtec daily for the allergy component Call with any questions or concerns Hang in there!!!

## 2015-04-30 NOTE — Assessment & Plan Note (Signed)
Pt's sxs and PE consistent w/ infxn.  Start abx.  Reviewed supportive care and red flags that should prompt return.  Pt expressed understanding and is in agreement w/ plan.  

## 2015-05-16 ENCOUNTER — Other Ambulatory Visit: Payer: Self-pay | Admitting: General Practice

## 2015-05-16 MED ORDER — PANTOPRAZOLE SODIUM 40 MG PO TBEC: 40.0000 mg | DELAYED_RELEASE_TABLET | Freq: Every day | ORAL | Status: DC

## 2015-05-22 ENCOUNTER — Telehealth: Payer: Self-pay | Admitting: Family Medicine

## 2015-05-22 ENCOUNTER — Ambulatory Visit (INDEPENDENT_AMBULATORY_CARE_PROVIDER_SITE_OTHER): Payer: Medicare Other | Admitting: Family Medicine

## 2015-05-22 ENCOUNTER — Encounter: Payer: Self-pay | Admitting: Family Medicine

## 2015-05-22 VITALS — BP 136/81 | HR 62 | Temp 98.0°F | Resp 16 | Ht 61.0 in | Wt 117.1 lb

## 2015-05-22 DIAGNOSIS — J209 Acute bronchitis, unspecified: Secondary | ICD-10-CM | POA: Diagnosis not present

## 2015-05-22 MED ORDER — SULFAMETHOXAZOLE-TRIMETHOPRIM 800-160 MG PO TABS: 1.0000 | ORAL_TABLET | Freq: Two times a day (BID) | ORAL | Status: DC

## 2015-05-22 MED ORDER — PROMETHAZINE-DM 6.25-15 MG/5ML PO SYRP: 5.0000 mL | ORAL_SOLUTION | Freq: Four times a day (QID) | ORAL | Status: DC | PRN

## 2015-05-22 NOTE — Patient Instructions (Signed)
Follow up as needed Start the bactrim twice daily- take w/ food Drink plenty of fluids Add daily Claritin or Zyrtec- store brand generic and whatever's on sale! Use the cough syrup as needed- will cause drowsiness Mucinex DM for daytime cough/congestion REST! Call with any questions or concerns Hang in there!!!

## 2015-05-22 NOTE — Progress Notes (Signed)
Pre visit review using our clinic review tool, if applicable. No additional management support is needed unless otherwise documented below in the visit note. 

## 2015-05-22 NOTE — Telephone Encounter (Signed)
Pt needs refill on Xanax, [facility] on [address]

## 2015-05-22 NOTE — Assessment & Plan Note (Signed)
Pt has hx of similar.  Continues to have maxillary sinus pain/pressure.  Cough is worse and chest is now tight despite completing course of Amox.  Switch to Bactrim.  Start cough meds prn.  Reviewed supportive care and red flags that should prompt return.  Pt expressed understanding and is in agreement w/ plan.

## 2015-05-22 NOTE — Progress Notes (Signed)
   Subjective:    Patient ID: Regina Gonzalez, female    DOB: 1945/09/07, 70 y.o.   MRN: [id number]  HPI URI- pt was seen for similar on 3/27 and prescribed Amoxicillin.  Pt continues to have dark green nasal drainage and cough productive of green sputum.  Hx of bronchitis.  Sinus pressure has improved but she continues to have ear pressure and HA.  Some chest tightness and SOB.  No fevers.  Lost voice 2 days ago.  + sick contacts.  Not currently on antihistamine for allergy sxs.   Review of Systems For ROS see HPI     Objective:   Physical Exam  Constitutional: She is oriented to person, place, and time. She appears well-developed and well-nourished. No distress.  HENT:  Head: Normocephalic and atraumatic.  Right Ear: Tympanic membrane normal.  Left Ear: Tympanic membrane normal.  Nose: Mucosal edema and rhinorrhea present. Right sinus exhibits maxillary sinus tenderness. Right sinus exhibits no frontal sinus tenderness. Left sinus exhibits maxillary sinus tenderness. Left sinus exhibits no frontal sinus tenderness.  Mouth/Throat: Uvula is midline and mucous membranes are normal. Posterior oropharyngeal erythema present. No oropharyngeal exudate.  Eyes: Conjunctivae and EOM are normal. Pupils are equal, round, and reactive to light.  Neck: Normal range of motion. Neck supple.  Cardiovascular: Normal rate, regular rhythm and normal heart sounds.   Pulmonary/Chest: Effort normal and breath sounds normal. No respiratory distress. She has no wheezes.  + hacking cough  Lymphadenopathy:    She has no cervical adenopathy.  Neurological: She is alert and oriented to person, place, and time.  Skin: Skin is warm and dry.  Psychiatric: She has a normal mood and affect. Her behavior is normal. Thought content normal.  Vitals reviewed.         Assessment & Plan:

## 2015-05-23 ENCOUNTER — Encounter: Payer: Self-pay | Admitting: Family Medicine

## 2015-05-23 MED ORDER — ALPRAZOLAM 0.5 MG PO TABS: ORAL_TABLET | ORAL | Status: DC

## 2015-05-23 NOTE — Telephone Encounter (Signed)
Medication filled to pharmacy as requested.   

## 2015-05-23 NOTE — Telephone Encounter (Signed)
Last OV 05/22/15 Alprazolam last filled 01/01/15 #60 with 1

## 2015-05-23 NOTE — Telephone Encounter (Signed)
Ok for #60, 1 refill 

## 2015-07-04 ENCOUNTER — Other Ambulatory Visit: Payer: Self-pay | Admitting: General Practice

## 2015-07-04 MED ORDER — CITALOPRAM HYDROBROMIDE 20 MG PO TABS: 40.0000 mg | ORAL_TABLET | Freq: Every day | ORAL | Status: DC

## 2015-07-11 ENCOUNTER — Encounter: Payer: Self-pay | Admitting: Family Medicine

## 2015-07-12 ENCOUNTER — Ambulatory Visit: Payer: Medicare Other | Admitting: Family Medicine

## 2015-07-17 ENCOUNTER — Other Ambulatory Visit: Payer: Self-pay | Admitting: Gastroenterology

## 2015-07-17 DIAGNOSIS — R1011 Right upper quadrant pain: Secondary | ICD-10-CM

## 2015-07-17 DIAGNOSIS — R109 Unspecified abdominal pain: Secondary | ICD-10-CM | POA: Diagnosis not present

## 2015-07-17 DIAGNOSIS — K219 Gastro-esophageal reflux disease without esophagitis: Secondary | ICD-10-CM | POA: Diagnosis not present

## 2015-07-17 DIAGNOSIS — R1013 Epigastric pain: Secondary | ICD-10-CM

## 2015-07-20 ENCOUNTER — Other Ambulatory Visit: Payer: Self-pay | Admitting: Gastroenterology

## 2015-07-20 DIAGNOSIS — R1011 Right upper quadrant pain: Secondary | ICD-10-CM

## 2015-07-20 DIAGNOSIS — R748 Abnormal levels of other serum enzymes: Secondary | ICD-10-CM

## 2015-07-20 DIAGNOSIS — R1013 Epigastric pain: Secondary | ICD-10-CM

## 2015-07-26 ENCOUNTER — Other Ambulatory Visit: Payer: Medicare Other

## 2015-07-27 ENCOUNTER — Ambulatory Visit
Admission: RE | Admit: 2015-07-27 | Discharge: 2015-07-27 | Disposition: A | Discharge status: Home / Self Care | Payer: Medicare Other | Source: Ambulatory Visit | Attending: Gastroenterology | Admitting: Gastroenterology

## 2015-07-27 DIAGNOSIS — K7689 Other specified diseases of liver: Secondary | ICD-10-CM | POA: Diagnosis not present

## 2015-07-27 DIAGNOSIS — R748 Abnormal levels of other serum enzymes: Secondary | ICD-10-CM

## 2015-07-27 DIAGNOSIS — R1011 Right upper quadrant pain: Secondary | ICD-10-CM

## 2015-07-27 DIAGNOSIS — R1013 Epigastric pain: Secondary | ICD-10-CM

## 2015-07-27 MED ORDER — IOPAMIDOL (ISOVUE-300) INJECTION 61%
100.0000 mL | Freq: Once | INTRAVENOUS | Status: AC | PRN
  Administered 2015-07-27: 100 mL via INTRAVENOUS

## 2015-07-30 ENCOUNTER — Other Ambulatory Visit: Payer: Medicare Other

## 2015-08-08 ENCOUNTER — Telehealth: Payer: Self-pay | Admitting: Family Medicine

## 2015-08-08 DIAGNOSIS — M25559 Pain in unspecified hip: Secondary | ICD-10-CM

## 2015-08-08 NOTE — Telephone Encounter (Signed)
Pt calling to request referral to GYN specialist, she had surgery for ovarian cysts several years ago and she is having pain again. Ok to leave message on cell phone voicemail.

## 2015-08-08 NOTE — Telephone Encounter (Signed)
Ok for GYN referral, dx pelvic pain

## 2015-08-08 NOTE — Telephone Encounter (Signed)
Referral placed.

## 2015-08-09 ENCOUNTER — Other Ambulatory Visit: Payer: Self-pay | Admitting: General Practice

## 2015-08-09 MED ORDER — CITALOPRAM HYDROBROMIDE 20 MG PO TABS: 40.0000 mg | ORAL_TABLET | Freq: Every day | ORAL | Status: DC

## 2015-08-10 ENCOUNTER — Other Ambulatory Visit: Payer: Self-pay | Admitting: Gastroenterology

## 2015-08-10 DIAGNOSIS — K2970 Gastritis, unspecified, without bleeding: Secondary | ICD-10-CM | POA: Diagnosis not present

## 2015-08-10 DIAGNOSIS — R1013 Epigastric pain: Secondary | ICD-10-CM | POA: Diagnosis not present

## 2015-08-10 DIAGNOSIS — K2950 Unspecified chronic gastritis without bleeding: Secondary | ICD-10-CM | POA: Diagnosis not present

## 2015-08-10 DIAGNOSIS — R1011 Right upper quadrant pain: Secondary | ICD-10-CM | POA: Diagnosis not present

## 2015-08-14 DIAGNOSIS — R1031 Right lower quadrant pain: Secondary | ICD-10-CM | POA: Diagnosis not present

## 2015-08-24 ENCOUNTER — Ambulatory Visit
Admission: RE | Admit: 2015-08-24 | Discharge: 2015-08-24 | Disposition: A | Discharge status: Home / Self Care | Payer: Medicare Other | Source: Ambulatory Visit | Attending: Gastroenterology | Admitting: Gastroenterology

## 2015-08-24 DIAGNOSIS — R1013 Epigastric pain: Secondary | ICD-10-CM

## 2015-08-24 DIAGNOSIS — R1011 Right upper quadrant pain: Secondary | ICD-10-CM

## 2015-08-24 DIAGNOSIS — K8689 Other specified diseases of pancreas: Secondary | ICD-10-CM | POA: Diagnosis not present

## 2015-08-30 ENCOUNTER — Other Ambulatory Visit: Payer: Medicare Other

## 2015-09-04 ENCOUNTER — Encounter: Payer: Self-pay | Admitting: Family Medicine

## 2015-09-11 DIAGNOSIS — R1013 Epigastric pain: Secondary | ICD-10-CM | POA: Diagnosis not present

## 2015-09-25 ENCOUNTER — Encounter: Payer: Self-pay | Admitting: Family Medicine

## 2015-09-25 ENCOUNTER — Ambulatory Visit (INDEPENDENT_AMBULATORY_CARE_PROVIDER_SITE_OTHER): Payer: Medicare Other | Admitting: Family Medicine

## 2015-09-25 VITALS — BP 114/74 | HR 60 | Temp 98.0°F | Resp 16 | Ht 61.0 in | Wt 117.1 lb

## 2015-09-25 DIAGNOSIS — E785 Hyperlipidemia, unspecified: Secondary | ICD-10-CM | POA: Diagnosis not present

## 2015-09-25 DIAGNOSIS — M810 Age-related osteoporosis without current pathological fracture: Secondary | ICD-10-CM

## 2015-09-25 DIAGNOSIS — Z79899 Other long term (current) drug therapy: Secondary | ICD-10-CM | POA: Diagnosis not present

## 2015-09-25 DIAGNOSIS — Z23 Encounter for immunization: Secondary | ICD-10-CM | POA: Diagnosis not present

## 2015-09-25 DIAGNOSIS — Z1159 Encounter for screening for other viral diseases: Secondary | ICD-10-CM | POA: Diagnosis not present

## 2015-09-25 DIAGNOSIS — Z0000 Encounter for general adult medical examination without abnormal findings: Secondary | ICD-10-CM

## 2015-09-25 LAB — CBC WITH DIFFERENTIAL/PLATELET
Basophils Absolute: 0.1 10*3/uL (ref 0.0–0.1)
Basophils Relative: 1.3 % (ref 0.0–3.0)
Eosinophils Absolute: 0.2 10*3/uL (ref 0.0–0.7)
Eosinophils Relative: 4.3 % (ref 0.0–5.0)
HCT: 35.2 % — ABNORMAL LOW (ref 36.0–46.0)
Hemoglobin: 11.5 g/dL — ABNORMAL LOW (ref 12.0–15.0)
Lymphocytes Relative: 46.6 % — ABNORMAL HIGH (ref 12.0–46.0)
Lymphs Abs: 1.8 10*3/uL (ref 0.7–4.0)
MCHC: 32.7 g/dL (ref 30.0–36.0)
MCV: 76.4 fl — ABNORMAL LOW (ref 78.0–100.0)
Monocytes Absolute: 0.3 10*3/uL (ref 0.1–1.0)
Monocytes Relative: 8.8 % (ref 3.0–12.0)
Neutro Abs: 1.5 10*3/uL (ref 1.4–7.7)
Neutrophils Relative %: 39 % — ABNORMAL LOW (ref 43.0–77.0)
Platelets: 271 10*3/uL (ref 150.0–400.0)
RBC: 4.6 Mil/uL (ref 3.87–5.11)
RDW: 18.3 % — ABNORMAL HIGH (ref 11.5–15.5)
WBC: 3.9 10*3/uL — ABNORMAL LOW (ref 4.0–10.5)

## 2015-09-25 LAB — HEPATIC FUNCTION PANEL
ALT: 12 U/L (ref 0–35)
AST: 17 U/L (ref 0–37)
Albumin: 4.4 g/dL (ref 3.5–5.2)
Alkaline Phosphatase: 52 U/L (ref 39–117)
Bilirubin, Direct: 0.1 mg/dL (ref 0.0–0.3)
Total Bilirubin: 0.4 mg/dL (ref 0.2–1.2)
Total Protein: 6.5 g/dL (ref 6.0–8.3)

## 2015-09-25 LAB — LIPID PANEL
Cholesterol: 265 mg/dL — ABNORMAL HIGH (ref 0–200)
HDL: 88.2 mg/dL (ref >39.00)
LDL Cholesterol: 165 mg/dL — ABNORMAL HIGH (ref 0–99)
NonHDL: 176.56
Total CHOL/HDL Ratio: 3
Triglycerides: 59 mg/dL (ref 0.0–149.0)
VLDL: 11.8 mg/dL (ref 0.0–40.0)

## 2015-09-25 LAB — BASIC METABOLIC PANEL
BUN: 18 mg/dL (ref 6–23)
CO2: 30 mEq/L (ref 19–32)
Calcium: 9.6 mg/dL (ref 8.4–10.5)
Chloride: 102 mEq/L (ref 96–112)
Creatinine, Ser: 1.05 mg/dL (ref 0.40–1.20)
GFR: 55.36 mL/min — ABNORMAL LOW (ref >60.00)
Glucose, Bld: 93 mg/dL (ref 70–99)
Potassium: 4 mEq/L (ref 3.5–5.1)
Sodium: 140 mEq/L (ref 135–145)

## 2015-09-25 LAB — VITAMIN D 25 HYDROXY (VIT D DEFICIENCY, FRACTURES): VITD: 32.74 ng/mL (ref 30.00–100.00)

## 2015-09-25 LAB — TSH: TSH: 2.24 u[IU]/mL (ref 0.35–4.50)

## 2015-09-25 NOTE — Progress Notes (Signed)
   Subjective:    Patient ID: Regina Gonzalez, female    DOB: 03/20/1945, 70 y.o.   MRN: [id number]  HPI Here today for CPE.  Risk Factors: Hyperlipidemia- chronic problem, currently on fish oil daily. Osteoporosis- chrnic problem, UTD on DEXA (due next year).  On Ca and Vit D daily Physical Activity: very active w/ grandson, some formal exercise Depression: chronic problem, currently well controlled on Celexa, and alprazolam prn.  Stopped Wellbutrin Hearing: normal to conversational tones and whispered voice at 6 ft ADL's: independent Cognitive: normal linear thought process, memory and attention intact Home Safety: safe at home, lives w/ husband Height, Weight, BMI, Visual Acuity: see vitals, vision corrected to 20/20 w/ glasses Counseling: UTD on DEXA (due 2018), colonoscopy (due 2025), mammo (due November).  Due for pneumovax today Care team reviewed and updated w/ pt Labs Ordered: See A&P Care Plan: See A&P    Review of Systems Patient reports no vision/ hearing changes, adenopathy,fever, weight change,  persistant/recurrent hoarseness , swallowing issues, chest pain, palpitations, edema, persistant/recurrent cough, hemoptysis, dyspnea (rest/exertional/paroxysmal nocturnal), gastrointestinal bleeding (melena, rectal bleeding), abdominal pain, significant heartburn, bowel changes, GU symptoms (dysuria, hematuria, incontinence), Gyn symptoms (abnormal  bleeding, pain),  syncope, focal weakness, memory loss, numbness & tingling, skin/hair/nail changes, abnormal bruising or bleeding, anxiety, or depression.     Objective:   Physical Exam General Appearance:    Alert, cooperative, no distress, appears stated age  Head:    Normocephalic, without obvious abnormality, atraumatic  Eyes:    PERRL, conjunctiva/corneas clear, EOM's intact, fundi    benign, both eyes  Ears:    Normal TM's and external ear canals, both ears  Nose:   Nares normal, septum midline, mucosa normal, no drainage   or sinus tenderness  Throat:   Lips, mucosa, and tongue normal; teeth and gums normal  Neck:   Supple, symmetrical, trachea midline, no adenopathy;    Thyroid: no enlargement/tenderness/nodules  Back:     Symmetric, no curvature, ROM normal, no CVA tenderness  Lungs:     Clear to auscultation bilaterally, respirations unlabored  Chest Wall:    No tenderness or deformity   Heart:    Regular rate and rhythm, S1 and S2 normal, no murmur, rub   or gallop  Breast Exam:    Deferred to mammo  Abdomen:     Soft, non-tender, bowel sounds active all four quadrants,    no masses, no organomegaly  Genitalia:    Deferred  Rectal:    Extremities:   Extremities normal, atraumatic, no cyanosis or edema  Pulses:   2+ and symmetric all extremities  Skin:   Skin color, texture, turgor normal, no rashes or lesions  Lymph nodes:   Cervical, supraclavicular, and axillary nodes normal  Neurologic:   CNII-XII intact, normal strength, sensation and reflexes    throughout          Assessment & Plan:

## 2015-09-25 NOTE — Patient Instructions (Signed)
Follow up in 6 months to recheck cholesterol We'll notify you of your lab results and make any changes if needed Continue to work on healthy diet and regular exercise- you look great! You are up to date on mammogram until November, bone density until next year, colonoscopy until 2025- yay!!! Call with any questions or concerns Happy Labor Day!!!

## 2015-09-25 NOTE — Assessment & Plan Note (Signed)
Chronic problem.  Currently on Fish Oil.  Applauded her recent efforts on healthy diet and regular exercise.  Check labs.

## 2015-09-25 NOTE — Assessment & Plan Note (Signed)
Chronic problem.  UTD on DEXA- due next year.  Check Vit D level.  Replete prn.

## 2015-09-25 NOTE — Addendum Note (Signed)
Addended by: Geannie RisenBRODMERKEL, JESSICA L on: 09/25/2015 08:55 AM   Modules accepted: Orders

## 2015-09-25 NOTE — Assessment & Plan Note (Signed)
Pt's PE WNL.  UTD on colonoscopy, mammo, DEXA.  Written screening schedule updated and given to pt.  Pneumovax given today.  Check labs.  Anticipatory guidance provided.

## 2015-09-25 NOTE — Progress Notes (Signed)
Pre visit review using our clinic review tool, if applicable. No additional management support is needed unless otherwise documented below in the visit note. 

## 2015-09-26 ENCOUNTER — Other Ambulatory Visit: Payer: Self-pay | Admitting: General Practice

## 2015-09-26 DIAGNOSIS — E785 Hyperlipidemia, unspecified: Secondary | ICD-10-CM

## 2015-09-26 LAB — HEPATITIS C ANTIBODY: HCV Ab: NEGATIVE

## 2015-09-26 MED ORDER — SIMVASTATIN 20 MG PO TABS: 20.0000 mg | ORAL_TABLET | Freq: Every day | ORAL | 6 refills | Status: DC

## 2015-10-04 ENCOUNTER — Telehealth: Payer: Self-pay | Admitting: Emergency Medicine

## 2015-10-04 NOTE — Telephone Encounter (Signed)
Patient called requesting a recheck of her cholesterol in 3 months instead of 6 months. Patient was started on Zocor for elevated cholesterol last visit, she is working on her diet and exercise. She has f/u appointment for 6 months. Patient has checked with Medicare and they will approve for patient to be seen in 3 months for cholesterol check. Ok for appointment in 3 months for recheck of cholesterol. Please advise

## 2015-10-05 ENCOUNTER — Other Ambulatory Visit: Payer: Self-pay | Admitting: Family Medicine

## 2015-10-05 NOTE — Telephone Encounter (Signed)
Left message on machine advising patient its ok to schedule a 3 month f/u to recheck her cholesterol levels.

## 2015-10-05 NOTE — Telephone Encounter (Signed)
Ok to recheck in 3 months if this is what pt prefers

## 2015-10-05 NOTE — Telephone Encounter (Signed)
Last OV 09/25/15 Alprazolam last filled 05/23/15 #60 with 1

## 2015-10-05 NOTE — Telephone Encounter (Signed)
Patient is scheduled for 12/26/15 for her 3 month recheck of chol.

## 2015-10-05 NOTE — Telephone Encounter (Signed)
Medication filled to pharmacy as requested.   

## 2015-10-09 ENCOUNTER — Other Ambulatory Visit: Payer: Self-pay | Admitting: Family Medicine

## 2015-12-10 ENCOUNTER — Other Ambulatory Visit: Payer: Self-pay | Admitting: Family Medicine

## 2015-12-10 DIAGNOSIS — Z1231 Encounter for screening mammogram for malignant neoplasm of breast: Secondary | ICD-10-CM

## 2015-12-23 ENCOUNTER — Other Ambulatory Visit: Payer: Self-pay | Admitting: Family Medicine

## 2015-12-26 ENCOUNTER — Other Ambulatory Visit (INDEPENDENT_AMBULATORY_CARE_PROVIDER_SITE_OTHER): Payer: Medicare Other

## 2015-12-26 DIAGNOSIS — E7800 Pure hypercholesterolemia, unspecified: Secondary | ICD-10-CM | POA: Diagnosis not present

## 2015-12-26 DIAGNOSIS — E785 Hyperlipidemia, unspecified: Secondary | ICD-10-CM

## 2015-12-26 LAB — HEPATIC FUNCTION PANEL
ALT: 14 U/L (ref 0–35)
AST: 21 U/L (ref 0–37)
Albumin: 4.3 g/dL (ref 3.5–5.2)
Alkaline Phosphatase: 53 U/L (ref 39–117)
Bilirubin, Direct: 0.1 mg/dL (ref 0.0–0.3)
Total Bilirubin: 0.5 mg/dL (ref 0.2–1.2)
Total Protein: 6.3 g/dL (ref 6.0–8.3)

## 2015-12-31 ENCOUNTER — Telehealth: Payer: Self-pay | Admitting: Family Medicine

## 2015-12-31 NOTE — Telephone Encounter (Signed)
Patient states she logged in to mychart to see her cholesterol results.  However, she is only seeing hepatic function results.  Patient states she is not sure why these results are showing because she was under the impression she was only to have her cholesterol checked.  Please follow up with patient.

## 2015-12-31 NOTE — Telephone Encounter (Signed)
Pt made aware that her LFT's were drawn only due to her beginning a cholesterol medication. Pt was advised that her cholesterol will not be checked again until February.

## 2016-01-09 ENCOUNTER — Ambulatory Visit
Admission: RE | Admit: 2016-01-09 | Discharge: 2016-01-09 | Disposition: A | Discharge status: Home / Self Care | Payer: Medicare Other | Source: Ambulatory Visit | Attending: Family Medicine | Admitting: Family Medicine

## 2016-01-09 DIAGNOSIS — Z1231 Encounter for screening mammogram for malignant neoplasm of breast: Secondary | ICD-10-CM

## 2016-01-09 LAB — HM MAMMOGRAPHY: HM Mammogram: NORMAL (ref 0–4)

## 2016-01-10 ENCOUNTER — Encounter: Payer: Self-pay | Admitting: Family Medicine

## 2016-01-10 ENCOUNTER — Ambulatory Visit (INDEPENDENT_AMBULATORY_CARE_PROVIDER_SITE_OTHER): Payer: Medicare Other | Admitting: Family Medicine

## 2016-01-10 VITALS — BP 116/80 | HR 90 | Temp 98.0°F | Resp 17 | Ht 61.0 in | Wt 119.0 lb

## 2016-01-10 DIAGNOSIS — M7711 Lateral epicondylitis, right elbow: Secondary | ICD-10-CM | POA: Diagnosis not present

## 2016-01-10 DIAGNOSIS — M546 Pain in thoracic spine: Secondary | ICD-10-CM | POA: Diagnosis not present

## 2016-01-10 MED ORDER — MELOXICAM 15 MG PO TABS: 15.0000 mg | ORAL_TABLET | Freq: Every day | ORAL | 1 refills | Status: DC

## 2016-01-10 NOTE — Progress Notes (Signed)
Pre visit review using our clinic review tool, if applicable. No additional management support is needed unless otherwise documented below in the visit note. 

## 2016-01-10 NOTE — Patient Instructions (Signed)
Follow up by phone or MyChart in 7-10 days if no improvement Start the Meloxicam (Mobic) once daily for pain/inflammation.  Take w/ food Add tylenol as needed for additional pain relief Ice the arm, heat the back/shoulder Try and avoid repetitive motions w/ the R arm Call with any questions or concerns Hang in there! Happy Holidays!!!

## 2016-01-10 NOTE — Progress Notes (Signed)
   Subjective:    Patient ID: Regina Gonzalez, female    DOB: [birth year], [age] y.o.   MRN: [id number]  HPI Back pain- R sided, started ~1 week ago.  Pain will radiate into R arm/forearm x4-5 days.  Pain is described as aching, intermittent.  No pattern to pain.  Pain is 8/10.  No injuries, increased activity level.  No fever/chills/weight loss/N/V, no burning, urgency, frequency.  Pt reports pain feels different from her known RA.  Not limited in movement.  No med changes.  Pt has not tried home tx w/ exception of tylenol- which provided some pain relief.  Pt has hx of this 2 yrs ago that improved w/ NSAID use.     Review of Systems For ROS see HPI     Objective:   Physical Exam  Constitutional: She is oriented to person, place, and time. She appears well-developed and well-nourished. No distress.  HENT:  Head: Normocephalic and atraumatic.  Pulmonary/Chest: Effort normal and breath sounds normal. No respiratory distress. She has no wheezes. She has no rales.  Musculoskeletal: She exhibits tenderness (TTP over R lateral epicondyle- pain w/ flexion/extension of R wrist and pronation/supination.  TTP over R subscapularis). She exhibits no edema or deformity.  Neurological: She is alert and oriented to person, place, and time. She has normal reflexes.  Skin: Skin is warm and dry. No rash noted. No erythema.  Vitals reviewed.         Assessment & Plan:

## 2016-01-13 NOTE — Assessment & Plan Note (Signed)
New.  Pt's sxs and PE consistent w/ lateral epicondylitis.  Reviewed dx and tx plan.  Start scheduled NSAIDs.  Discussed optional wrist bracing.  Ice.  Reviewed supportive care and red flags that should prompt return.  Pt expressed understanding and is in agreement w/ plan.

## 2016-01-13 NOTE — Assessment & Plan Note (Signed)
Recurrent issue for pt.  Most consistent w/ R sided thoracic back spasm w/ TTP over R subscapularis.  Pt declines muscle relaxer.  Start scheduled NSAIDs.  Heat.  Reviewed supportive care and red flags that should prompt return.  Pt expressed understanding and is in agreement w/ plan.

## 2016-01-27 ENCOUNTER — Other Ambulatory Visit: Payer: Self-pay | Admitting: Family Medicine

## 2016-02-19 ENCOUNTER — Encounter: Payer: Self-pay | Admitting: Physician Assistant

## 2016-02-19 ENCOUNTER — Telehealth: Payer: Self-pay | Admitting: Family Medicine

## 2016-02-19 ENCOUNTER — Ambulatory Visit (INDEPENDENT_AMBULATORY_CARE_PROVIDER_SITE_OTHER): Payer: Medicare Other | Admitting: Physician Assistant

## 2016-02-19 VITALS — BP 128/60 | HR 59 | Temp 98.0°F | Resp 14 | Ht 61.0 in | Wt 117.0 lb

## 2016-02-19 DIAGNOSIS — J019 Acute sinusitis, unspecified: Secondary | ICD-10-CM | POA: Diagnosis not present

## 2016-02-19 DIAGNOSIS — B9689 Other specified bacterial agents as the cause of diseases classified elsewhere: Secondary | ICD-10-CM

## 2016-02-19 MED ORDER — FLUTICASONE PROPIONATE 50 MCG/ACT NA SUSP: 2.0000 | Freq: Every day | NASAL | 6 refills | Status: DC

## 2016-02-19 MED ORDER — AMOXICILLIN 875 MG PO TABS: 875.0000 mg | ORAL_TABLET | Freq: Two times a day (BID) | ORAL | 0 refills | Status: DC

## 2016-02-19 MED ORDER — DOXYCYCLINE HYCLATE 100 MG PO CAPS: 100.0000 mg | ORAL_CAPSULE | Freq: Two times a day (BID) | ORAL | 0 refills | Status: DC

## 2016-02-19 NOTE — Patient Instructions (Signed)
Please take antibiotic as directed.  Increase fluid intake.  Use Saline nasal spray.  Take a daily multivitamin. Use Flonase as directed. Continue OTC Environmental consultanteltzer.  Place a humidifier in the bedroom.  Please call or return clinic if symptoms are not improving.  Sinusitis Sinusitis is redness, soreness, and swelling (inflammation) of the paranasal sinuses. Paranasal sinuses are air pockets within the bones of your face (beneath the eyes, the middle of the forehead, or above the eyes). In healthy paranasal sinuses, mucus is able to drain out, and air is able to circulate through them by way of your nose. However, when your paranasal sinuses are inflamed, mucus and air can become trapped. This can allow bacteria and other germs to grow and cause infection. Sinusitis can develop quickly and last only a short time (acute) or continue over a long period (chronic). Sinusitis that lasts for more than 12 weeks is considered chronic.  CAUSES  Causes of sinusitis include:  Allergies.  Structural abnormalities, such as displacement of the cartilage that separates your nostrils (deviated septum), which can decrease the air flow through your nose and sinuses and affect sinus drainage.  Functional abnormalities, such as when the small hairs (cilia) that line your sinuses and help remove mucus do not work properly or are not present. SYMPTOMS  Symptoms of acute and chronic sinusitis are the same. The primary symptoms are pain and pressure around the affected sinuses. Other symptoms include:  Upper toothache.  Earache.  Headache.  Bad breath.  Decreased sense of smell and taste.  A cough, which worsens when you are lying flat.  Fatigue.  Fever.  Thick drainage from your nose, which often is green and may contain pus (purulent).  Swelling and warmth over the affected sinuses. DIAGNOSIS  Your caregiver will perform a physical exam. During the exam, your caregiver may:  Look in your nose for signs of  abnormal growths in your nostrils (nasal polyps).  Tap over the affected sinus to check for signs of infection.  View the inside of your sinuses (endoscopy) with a special imaging device with a light attached (endoscope), which is inserted into your sinuses. If your caregiver suspects that you have chronic sinusitis, one or more of the following tests may be recommended:  Allergy tests.  Nasal culture A sample of mucus is taken from your nose and sent to a lab and screened for bacteria.  Nasal cytology A sample of mucus is taken from your nose and examined by your caregiver to determine if your sinusitis is related to an allergy. TREATMENT  Most cases of acute sinusitis are related to a viral infection and will resolve on their own within 10 days. Sometimes medicines are prescribed to help relieve symptoms (pain medicine, decongestants, nasal steroid sprays, or saline sprays).  However, for sinusitis related to a bacterial infection, your caregiver will prescribe antibiotic medicines. These are medicines that will help kill the bacteria causing the infection.  Rarely, sinusitis is caused by a fungal infection. In theses cases, your caregiver will prescribe antifungal medicine. For some cases of chronic sinusitis, surgery is needed. Generally, these are cases in which sinusitis recurs more than 3 times per year, despite other treatments. HOME CARE INSTRUCTIONS   Drink plenty of water. Water helps thin the mucus so your sinuses can drain more easily.  Use a humidifier.  Inhale steam 3 to 4 times a day (for example, sit in the bathroom with the shower running).  Apply a warm, moist washcloth to your  face 3 to 4 times a day, or as directed by your caregiver.  Use saline nasal sprays to help moisten and clean your sinuses.  Take over-the-counter or prescription medicines for pain, discomfort, or fever only as directed by your caregiver. SEEK IMMEDIATE MEDICAL CARE IF:  You have increasing  pain or severe headaches.  You have nausea, vomiting, or drowsiness.  You have swelling around your face.  You have vision problems.  You have a stiff neck.  You have difficulty breathing. MAKE SURE YOU:   Understand these instructions.  Will watch your condition.  Will get help right away if you are not doing well or get worse. Document Released: 01/20/2005 Document Revised: 04/14/2011 Document Reviewed: 02/04/2011 [facility] Patient Information 2014 [place], [place].

## 2016-02-19 NOTE — Telephone Encounter (Signed)
Thank you for making me aware.  I have sent in a script for Amoxicillin twice daily for 10 days.  Please inform patient.

## 2016-02-19 NOTE — Progress Notes (Signed)
Pre visit review using our clinic review tool, if applicable. No additional management support is needed unless otherwise documented below in the visit note. 

## 2016-02-19 NOTE — Telephone Encounter (Signed)
Returned call to patient and made her aware of new script sent to pharmacy.

## 2016-02-19 NOTE — Telephone Encounter (Signed)
Patient calling to report she was seen in office this morning and prescribed doxycycline (VIBRAMYCIN) 100 MG capsule.  She is at the pharmacy picking up her medication and the cost is $58.  She declined the rx.  She would like to have another medication sent in that would be cheaper, perhaps amoxicillin.  Pharmacy:   [id number] [place] - [address] [phone] (Phone) [phone] (Fax)

## 2016-02-19 NOTE — Progress Notes (Signed)
Patient presents to clinic today c/o 1 week of head and chest congestion that was mild at first with dry cough. Now with more significant chest congestion with cough productive of green sputum. Also notes sinus pain/facial pain with aches for a few days. Denies fever, chills, SOB. Denies recent travel or sick contact. Denies history of asthma but is a former smoker. Has taken Alka Seltzer OTC for symptom relief.   Past Medical History:  Diagnosis Date  . Allergic rhinitis   . Anxiety   . Arthritis    HANDS  . Benign liver cyst    multiple  . Chronic constipation   . Depression   . Diverticulosis of colon   . Emphysematous COPD (HCC)   . External hemorrhoid   . GERD (gastroesophageal reflux disease)   . History of colon polyps   . History of Helicobacter pylori infection   . History of TIA (transient ischemic attack)    apr 2013-- no residual  . Hyperlipidemia   . Occipital neuralgia of left side 12/08/2014  . Osteoporosis   . Pancreatic cyst    simple benign  . PONV (postoperative nausea and vomiting)   . Prolapsed internal hemorrhoids, grade 3   . Simple renal cyst    right   . Wears glasses   . Wears partial dentures    UPPER    Current Outpatient Prescriptions on File Prior to Visit  Medication Sig Dispense Refill  . ALPRAZolam (XANAX) 0.5 MG tablet TAKE 1 TO 2 TABLETS BY MOUTH 3 TIMES A DAY AS NEEDED FOR ANXIETY 60 tablet 1  . aspirin 81 MG tablet Take 81 mg by mouth once a week.     . Calcium Carb-Cholecalciferol (CALCIUM 600 + D PO) Take 2 tablets by mouth daily.    . citalopram (CELEXA) 20 MG tablet TAKE 2 TABLETS (40 MG TOTAL) BY MOUTH DAILY. 60 tablet 3  . cyanocobalamin 500 MCG tablet Take 500 mcg by mouth daily.    Marland Kitchen. dicyclomine (BENTYL) 20 MG tablet TAKE ONE TABLET BY MOUTH THREE TIMES DAILY BETWEEN MEALS AS NEEDED. 30 tablet 2  . Docusate Calcium (STOOL SOFTENER PO) Take by mouth.    . ferrous sulfate 325 (65 FE) MG tablet Take 1 tablet (325 mg total) by  mouth daily with breakfast. 30 tablet 6  . fish oil-omega-3 fatty acids 1000 MG capsule Take 1 g by mouth See admin instructions. Three  times a week    . Multiple Vitamins-Minerals (MULTIVITAMIN WITH MINERALS) tablet Take 1 tablet by mouth 4 (four) times a week. Alternates with Caltrate    . pantoprazole (PROTONIX) 40 MG tablet TAKE 1 TABLET (40 MG TOTAL) BY MOUTH DAILY. 30 tablet 6  . simvastatin (ZOCOR) 20 MG tablet Take 1 tablet (20 mg total) by mouth at bedtime. 30 tablet 6   No current facility-administered medications on file prior to visit.     Allergies  Allergen Reactions  . Erythromycin Other (See Comments)    "severe GI upset, also all "mycins" meds   . Flagyl [Metronidazole] Other (See Comments)    "flu-like symptoms"    Family History  Problem Relation Age of Onset  . Alcoholism Father   . Diabetes type II Brother   . Heart attack Brother   . Multiple sclerosis Daughter   . Colon cancer Neg Hx     Social History   Social History  . Marital status: Married    Spouse name: Channing MuttersRoy  . Number of children:  2  . Years of education: 69   Occupational History  . [employer]    Social History Main Topics  . Smoking status: Former Smoker    Packs/day: 1.00    Years: 17.00    Types: Cigarettes    Quit date: 04/02/1988  . Smokeless tobacco: Never Used  . Alcohol use No  . Drug use: No  . Sexual activity: Not Asked   Other Topics Concern  . None   Social History Narrative   Patient lives at home wit her husband  Channing Mutters). works for [employer]. Patient has high school education and two children. Caffeine 2-3 cups daily.   Review of Systems - See HPI.  All other ROS are negative.  BP 128/60   Pulse (!) 59   Temp 98 F (36.7 C) (Oral)   Resp 14   Ht 5\' 1"  (1.549 m)   Wt 117 lb (53.1 kg)   LMP 06/16/1997   SpO2 98%   BMI 22.11 kg/m   Physical Exam  Constitutional: She is oriented to person, place, and  time and well-developed, well-nourished, and in no distress.  HENT:  Head: Normocephalic and atraumatic.  Right Ear: Tympanic membrane and external ear normal.  Left Ear: Tympanic membrane and external ear normal.  Nose: Mucosal edema and rhinorrhea present. Right sinus exhibits frontal sinus tenderness. Left sinus exhibits frontal sinus tenderness.  Mouth/Throat: Uvula is midline, oropharynx is clear and moist and mucous membranes are normal.  Eyes: Conjunctivae are normal.  Neck: Neck supple.  Cardiovascular: Normal rate, regular rhythm, normal heart sounds and intact distal pulses.   Pulmonary/Chest: Effort normal and breath sounds normal. No respiratory distress. She has no wheezes. She has no rales. She exhibits no tenderness.  Neurological: She is alert and oriented to person, place, and time.  Skin: Skin is warm and dry. No rash noted.  Psychiatric: Affect normal.  Vitals reviewed.   Recent Results (from the past 2160 hour(s))  Hepatic function panel     Status: None   Collection Time: 12/26/15  8:53 AM  Result Value Ref Range   Total Bilirubin 0.5 0.2 - 1.2 mg/dL   Bilirubin, Direct 0.1 0.0 - 0.3 mg/dL   Alkaline Phosphatase 53 39 - 117 U/L   AST 21 0 - 37 U/L   ALT 14 0 - 35 U/L   Total Protein 6.3 6.0 - 8.3 g/dL   Albumin 4.3 3.5 - 5.2 g/dL  [facility] MAMMOGRAPHY     Status: None   Collection Time: 01/09/16 12:00 AM  Result Value Ref Range   HM Mammogram Self Reported Normal 0-4 Bi-Rad, Self Reported Normal    Assessment/Plan: 1. Acute bacterial sinusitis Rx Doxycycline and Flonase nasal spray. Increase hydration. Continue OTC regimen. FU if not resolving.   - fluticasone (FLONASE) 50 MCG/ACT nasal spray; Place 2 sprays into both nostrils daily.  Dispense: 16 g; Refill: 6 - doxycycline (VIBRAMYCIN) 100 MG capsule; Take 1 capsule (100 mg total) by mouth 2 (two) times daily.  Dispense: 20 capsule; Refill: 0   Piedad Climes, [place]

## 2016-02-23 ENCOUNTER — Other Ambulatory Visit: Payer: Self-pay | Admitting: Family Medicine

## 2016-03-24 ENCOUNTER — Ambulatory Visit: Payer: Medicare Other | Admitting: Family Medicine

## 2016-03-26 ENCOUNTER — Other Ambulatory Visit: Payer: Self-pay | Admitting: Family Medicine

## 2016-03-26 NOTE — Telephone Encounter (Signed)
Medication filled to pharmacy as requested.   

## 2016-03-26 NOTE — Telephone Encounter (Signed)
Last OV 01/10/16 Alprazolam last filled 10/05/15 #60 with 1

## 2016-04-02 ENCOUNTER — Encounter: Payer: Self-pay | Admitting: Family Medicine

## 2016-04-02 ENCOUNTER — Ambulatory Visit (INDEPENDENT_AMBULATORY_CARE_PROVIDER_SITE_OTHER): Payer: Medicare Other | Admitting: Family Medicine

## 2016-04-02 VITALS — BP 124/73 | HR 55 | Temp 98.0°F | Resp 16 | Ht 61.0 in | Wt 116.5 lb

## 2016-04-02 DIAGNOSIS — E7800 Pure hypercholesterolemia, unspecified: Secondary | ICD-10-CM

## 2016-04-02 LAB — LIPID PANEL
Cholesterol: 182 mg/dL (ref 0–200)
HDL: 83 mg/dL (ref >39.00)
LDL Cholesterol: 90 mg/dL (ref 0–99)
NonHDL: 99.33
Total CHOL/HDL Ratio: 2
Triglycerides: 47 mg/dL (ref 0.0–149.0)
VLDL: 9.4 mg/dL (ref 0.0–40.0)

## 2016-04-02 LAB — HEPATIC FUNCTION PANEL
ALT: 14 U/L (ref 0–35)
AST: 20 U/L (ref 0–37)
Albumin: 4.3 g/dL (ref 3.5–5.2)
Alkaline Phosphatase: 61 U/L (ref 39–117)
Bilirubin, Direct: 0.1 mg/dL (ref 0.0–0.3)
Total Bilirubin: 0.4 mg/dL (ref 0.2–1.2)
Total Protein: 6.6 g/dL (ref 6.0–8.3)

## 2016-04-02 NOTE — Progress Notes (Signed)
Pre visit review using our clinic review tool, if applicable. No additional management support is needed unless otherwise documented below in the visit note. 

## 2016-04-02 NOTE — Patient Instructions (Signed)
Schedule your complete physical and your Medicare Wellness visit (with Selena BattenKim- our health coach) in 6 months We'll notify you of your lab results and make any changes if needed Continue to work on healthy diet and regular exercise- you look great! Call with any questions or concerns Happy Spring!!!

## 2016-04-02 NOTE — Assessment & Plan Note (Signed)
Chronic problem.  Tolerating statin w/o difficulty.  Stressed need for healthy diet and regular exercise.  Check labs.  Adjust meds prn  

## 2016-04-02 NOTE — Progress Notes (Signed)
   Subjective:    Patient ID: Regina Gonzalez, female    DOB: 07/07/1945, 71 y.o.   MRN: [id number]  HPI Hyperlipidemia- chronic problem, on Simvastatin.  Pt reports feeling well.  Not currently exercising.  No CP, SOB, HAs, abd pain, N/V, myalgias.   Review of Systems For ROS see HPI     Objective:   Physical Exam  Constitutional: She is oriented to person, place, and time. She appears well-developed and well-nourished. No distress.  HENT:  Head: Normocephalic and atraumatic.  Eyes: Conjunctivae and EOM are normal. Pupils are equal, round, and reactive to light.  Neck: Normal range of motion. Neck supple. No thyromegaly present.  Cardiovascular: Normal rate, regular rhythm, normal heart sounds and intact distal pulses.   No murmur heard. Pulmonary/Chest: Effort normal and breath sounds normal. No respiratory distress.  Abdominal: Soft. She exhibits no distension. There is no tenderness.  Musculoskeletal: She exhibits no edema.  Lymphadenopathy:    She has no cervical adenopathy.  Neurological: She is alert and oriented to person, place, and time.  Skin: Skin is warm and dry.  Psychiatric: She has a normal mood and affect. Her behavior is normal.  Vitals reviewed.         Assessment & Plan:

## 2016-04-07 DIAGNOSIS — R109 Unspecified abdominal pain: Secondary | ICD-10-CM | POA: Diagnosis not present

## 2016-04-08 ENCOUNTER — Telehealth: Payer: Self-pay | Admitting: *Deleted

## 2016-04-08 ENCOUNTER — Other Ambulatory Visit: Payer: Self-pay | Admitting: Physician Assistant

## 2016-04-08 DIAGNOSIS — R748 Abnormal levels of other serum enzymes: Secondary | ICD-10-CM

## 2016-04-08 DIAGNOSIS — R1011 Right upper quadrant pain: Secondary | ICD-10-CM

## 2016-04-08 DIAGNOSIS — K7689 Other specified diseases of liver: Secondary | ICD-10-CM

## 2016-04-08 DIAGNOSIS — K862 Cyst of pancreas: Secondary | ICD-10-CM

## 2016-04-08 DIAGNOSIS — R1031 Right lower quadrant pain: Secondary | ICD-10-CM

## 2016-04-08 NOTE — Telephone Encounter (Signed)
Left a detailed message for patient that insurance will not cover the  Alprazolam.  She will need to call her insurance company and see which medication they will covedr and then we will go from there.

## 2016-04-09 NOTE — Telephone Encounter (Signed)
Spoke with patient - it has been determined that they will only pay for her to get 30 days because she is in the 90-day period for her new insurance.  Patient is aware that she is on a limited supply at first.

## 2016-04-09 NOTE — Telephone Encounter (Signed)
Pt returned calling stating that she called her insurance company and they state that they do cover this med. Please call back to clarify.

## 2016-04-10 ENCOUNTER — Ambulatory Visit
Admission: RE | Admit: 2016-04-10 | Discharge: 2016-04-10 | Disposition: A | Discharge status: Home / Self Care | Payer: Medicare Other | Source: Ambulatory Visit | Attending: Physician Assistant | Admitting: Physician Assistant

## 2016-04-10 DIAGNOSIS — R1011 Right upper quadrant pain: Secondary | ICD-10-CM

## 2016-04-10 DIAGNOSIS — R1031 Right lower quadrant pain: Secondary | ICD-10-CM

## 2016-04-10 DIAGNOSIS — R748 Abnormal levels of other serum enzymes: Secondary | ICD-10-CM

## 2016-04-10 DIAGNOSIS — K7689 Other specified diseases of liver: Secondary | ICD-10-CM

## 2016-04-10 DIAGNOSIS — K862 Cyst of pancreas: Secondary | ICD-10-CM

## 2016-04-10 MED ORDER — IOPAMIDOL (ISOVUE-300) INJECTION 61%
100.0000 mL | Freq: Once | INTRAVENOUS | Status: AC | PRN
  Administered 2016-04-10: 100 mL via INTRAVENOUS

## 2016-05-04 ENCOUNTER — Other Ambulatory Visit: Payer: Self-pay | Admitting: Family Medicine

## 2016-05-11 ENCOUNTER — Other Ambulatory Visit: Payer: Self-pay | Admitting: Family Medicine

## 2016-06-03 ENCOUNTER — Other Ambulatory Visit: Payer: Self-pay | Admitting: Family Medicine

## 2016-07-07 ENCOUNTER — Ambulatory Visit (INDEPENDENT_AMBULATORY_CARE_PROVIDER_SITE_OTHER): Payer: Medicare Other | Admitting: Family Medicine

## 2016-07-07 ENCOUNTER — Encounter: Payer: Self-pay | Admitting: Family Medicine

## 2016-07-07 VITALS — BP 130/81 | HR 69 | Resp 17 | Ht 61.0 in | Wt 119.2 lb

## 2016-07-07 DIAGNOSIS — R102 Pelvic and perineal pain: Secondary | ICD-10-CM

## 2016-07-07 NOTE — Patient Instructions (Signed)
Follow up as needed/scheduled We'll call you with your ultrasound appt and determine the next steps based on results Continue to drink plenty of fluids Call with any questions or concerns Hang in there!!

## 2016-07-07 NOTE — Progress Notes (Signed)
   Subjective:    Patient ID: Regina Gonzalez, female    DOB: [birth year], 71 y.o.   MRN: [id number]  HPI Pelvic pressure- R sided, 'it's just always there'.  Has seen GI who have ruled out GI issues as cause of pain.  Hx of ovarian cysts and pt reports this feels similar.  No recent constipation.  Blood work has been normal recently.  Pt has seen Dr Seymour BarsLaVoie previously.  Was referred to GYN in July 2017 for similar pain but she never went.   Review of Systems For ROS see HPI     Objective:   Physical Exam  Constitutional: She is oriented to person, place, and time. She appears well-developed and well-nourished. No distress.  HENT:  Head: Normocephalic and atraumatic.  Abdominal: Soft. Bowel sounds are normal. She exhibits no distension and no mass. There is tenderness (R pelvic TTP- no mass or swelling appreciated). There is no rebound and no guarding.  Neurological: She is alert and oriented to person, place, and time.  Skin: Skin is warm and dry.  Psychiatric: She has a normal mood and affect. Her behavior is normal. Thought content normal.  Vitals reviewed.         Assessment & Plan:  Pelvic pain- pt has hx of of this.  Was referred to GYN in July 2017 but never went or had additional work up.  Saw Dr Seymour BarsLaVoie in 2015.  Discussed going back to see her but pt prefers to have US independent of GYN at this time and only follow up with GYN if needed.  I told her this would likely require GYN f/u b/c 1) there would be something on US that accounts for her pain that needs to be addressed or 2) there won't be something on US and further workup will be needed.  Pt still asking for US independent of GYN at this time.  Order entered.  Will follow closely.

## 2016-07-07 NOTE — Progress Notes (Signed)
Pre visit review using our clinic review tool, if applicable. No additional management support is needed unless otherwise documented below in the visit note. 

## 2016-07-09 ENCOUNTER — Ambulatory Visit
Admission: RE | Admit: 2016-07-09 | Discharge: 2016-07-09 | Disposition: A | Discharge status: Home / Self Care | Payer: Medicare Other | Source: Ambulatory Visit | Attending: Physician Assistant | Admitting: Physician Assistant

## 2016-07-09 ENCOUNTER — Other Ambulatory Visit: Payer: Self-pay | Admitting: Physician Assistant

## 2016-07-09 DIAGNOSIS — K5900 Constipation, unspecified: Secondary | ICD-10-CM

## 2016-07-09 DIAGNOSIS — R1031 Right lower quadrant pain: Secondary | ICD-10-CM

## 2016-07-18 ENCOUNTER — Other Ambulatory Visit: Payer: Self-pay | Admitting: Family Medicine

## 2016-07-18 DIAGNOSIS — R102 Pelvic and perineal pain: Secondary | ICD-10-CM

## 2016-07-30 ENCOUNTER — Ambulatory Visit
Admission: RE | Admit: 2016-07-30 | Discharge: 2016-07-30 | Disposition: A | Discharge status: Home / Self Care | Payer: Medicare Other | Source: Ambulatory Visit | Attending: Family Medicine | Admitting: Family Medicine

## 2016-07-30 DIAGNOSIS — R102 Pelvic and perineal pain: Secondary | ICD-10-CM

## 2016-08-02 ENCOUNTER — Other Ambulatory Visit: Payer: Self-pay | Admitting: Family Medicine

## 2016-08-07 ENCOUNTER — Encounter: Payer: Self-pay | Admitting: Physician Assistant

## 2016-08-07 ENCOUNTER — Ambulatory Visit (INDEPENDENT_AMBULATORY_CARE_PROVIDER_SITE_OTHER): Payer: Medicare Other | Admitting: Physician Assistant

## 2016-08-07 VITALS — BP 120/50 | HR 68 | Temp 98.3°F | Resp 14 | Ht 61.0 in | Wt 115.0 lb

## 2016-08-07 DIAGNOSIS — J019 Acute sinusitis, unspecified: Secondary | ICD-10-CM

## 2016-08-07 DIAGNOSIS — R350 Frequency of micturition: Secondary | ICD-10-CM

## 2016-08-07 DIAGNOSIS — R195 Other fecal abnormalities: Secondary | ICD-10-CM | POA: Diagnosis not present

## 2016-08-07 DIAGNOSIS — B9689 Other specified bacterial agents as the cause of diseases classified elsewhere: Secondary | ICD-10-CM

## 2016-08-07 LAB — COMPREHENSIVE METABOLIC PANEL
ALT: 17 U/L (ref 0–35)
AST: 19 U/L (ref 0–37)
Albumin: 4.5 g/dL (ref 3.5–5.2)
Alkaline Phosphatase: 62 U/L (ref 39–117)
BUN: 11 mg/dL (ref 6–23)
CO2: 33 mEq/L — ABNORMAL HIGH (ref 19–32)
Calcium: 9.9 mg/dL (ref 8.4–10.5)
Chloride: 101 mEq/L (ref 96–112)
Creatinine, Ser: 0.99 mg/dL (ref 0.40–1.20)
GFR: 59.1 mL/min — ABNORMAL LOW (ref >60.00)
Glucose, Bld: 111 mg/dL — ABNORMAL HIGH (ref 70–99)
Potassium: 3.9 mEq/L (ref 3.5–5.1)
Sodium: 139 mEq/L (ref 135–145)
Total Bilirubin: 0.5 mg/dL (ref 0.2–1.2)
Total Protein: 6.6 g/dL (ref 6.0–8.3)

## 2016-08-07 LAB — CBC WITH DIFFERENTIAL/PLATELET
Basophils Absolute: 0 10*3/uL (ref 0.0–0.1)
Basophils Relative: 1.2 % (ref 0.0–3.0)
Eosinophils Absolute: 0.2 10*3/uL (ref 0.0–0.7)
Eosinophils Relative: 3.9 % (ref 0.0–5.0)
HCT: 42.3 % (ref 36.0–46.0)
Hemoglobin: 14.4 g/dL (ref 12.0–15.0)
Lymphocytes Relative: 39.5 % (ref 12.0–46.0)
Lymphs Abs: 1.5 10*3/uL (ref 0.7–4.0)
MCHC: 34 g/dL (ref 30.0–36.0)
MCV: 90.8 fl (ref 78.0–100.0)
Monocytes Absolute: 0.4 10*3/uL (ref 0.1–1.0)
Monocytes Relative: 9.3 % (ref 3.0–12.0)
Neutro Abs: 1.8 10*3/uL (ref 1.4–7.7)
Neutrophils Relative %: 46.1 % (ref 43.0–77.0)
Platelets: 211 10*3/uL (ref 150.0–400.0)
RBC: 4.66 Mil/uL (ref 3.87–5.11)
RDW: 13.9 % (ref 11.5–15.5)
WBC: 3.9 10*3/uL — ABNORMAL LOW (ref 4.0–10.5)

## 2016-08-07 LAB — POCT URINALYSIS DIPSTICK
Bilirubin, UA: NEGATIVE
Glucose, UA: NEGATIVE
Ketones, UA: NEGATIVE
Leukocytes, UA: NEGATIVE
Nitrite, UA: NEGATIVE
Protein, UA: NEGATIVE
Spec Grav, UA: 1.01 (ref 1.010–1.025)
Urobilinogen, UA: 0.2 E.U./dL
pH, UA: 6 (ref 5.0–8.0)

## 2016-08-07 LAB — URINALYSIS, MICROSCOPIC ONLY

## 2016-08-07 MED ORDER — AMOXICILLIN 875 MG PO TABS: 875.0000 mg | ORAL_TABLET | Freq: Two times a day (BID) | ORAL | 0 refills | Status: DC

## 2016-08-07 NOTE — Patient Instructions (Addendum)
Please take antibiotic as directed.  Increase fluid intake.  Use Saline nasal spray.  Take a daily multivitamin. Start probiotic.  Place a humidifier in the bedroom.    Please go to the lab for blood work. I will call you with your results.  Restart multivitamins. Start a daily probiotic.   Sinusitis Sinusitis is redness, soreness, and swelling (inflammation) of the paranasal sinuses. Paranasal sinuses are air pockets within the bones of your face (beneath the eyes, the middle of the forehead, or above the eyes). In healthy paranasal sinuses, mucus is able to drain out, and air is able to circulate through them by way of your nose. However, when your paranasal sinuses are inflamed, mucus and air can become trapped. This can allow bacteria and other germs to grow and cause infection. Sinusitis can develop quickly and last only a short time (acute) or continue over a long period (chronic). Sinusitis that lasts for more than 12 weeks is considered chronic.  CAUSES  Causes of sinusitis include:  Allergies.  Structural abnormalities, such as displacement of the cartilage that separates your nostrils (deviated septum), which can decrease the air flow through your nose and sinuses and affect sinus drainage.  Functional abnormalities, such as when the small hairs (cilia) that line your sinuses and help remove mucus do not work properly or are not present. SYMPTOMS  Symptoms of acute and chronic sinusitis are the same. The primary symptoms are pain and pressure around the affected sinuses. Other symptoms include:  Upper toothache.  Earache.  Headache.  Bad breath.  Decreased sense of smell and taste.  A cough, which worsens when you are lying flat.  Fatigue.  Fever.  Thick drainage from your nose, which often is green and may contain pus (purulent).  Swelling and warmth over the affected sinuses. DIAGNOSIS  Your caregiver will perform a physical exam. During the exam, your caregiver  may:  Look in your nose for signs of abnormal growths in your nostrils (nasal polyps).  Tap over the affected sinus to check for signs of infection.  View the inside of your sinuses (endoscopy) with a special imaging device with a light attached (endoscope), which is inserted into your sinuses. If your caregiver suspects that you have chronic sinusitis, one or more of the following tests may be recommended:  Allergy tests.  Nasal culture A sample of mucus is taken from your nose and sent to a lab and screened for bacteria.  Nasal cytology A sample of mucus is taken from your nose and examined by your caregiver to determine if your sinusitis is related to an allergy. TREATMENT  Most cases of acute sinusitis are related to a viral infection and will resolve on their own within 10 days. Sometimes medicines are prescribed to help relieve symptoms (pain medicine, decongestants, nasal steroid sprays, or saline sprays).  However, for sinusitis related to a bacterial infection, your caregiver will prescribe antibiotic medicines. These are medicines that will help kill the bacteria causing the infection.  Rarely, sinusitis is caused by a fungal infection. In theses cases, your caregiver will prescribe antifungal medicine. For some cases of chronic sinusitis, surgery is needed. Generally, these are cases in which sinusitis recurs more than 3 times per year, despite other treatments. HOME CARE INSTRUCTIONS   Drink plenty of water. Water helps thin the mucus so your sinuses can drain more easily.  Use a humidifier.  Inhale steam 3 to 4 times a day (for example, sit in the bathroom with the  shower running).  Apply a warm, moist washcloth to your face 3 to 4 times a day, or as directed by your caregiver.  Use saline nasal sprays to help moisten and clean your sinuses.  Take over-the-counter or prescription medicines for pain, discomfort, or fever only as directed by your caregiver. SEEK IMMEDIATE  MEDICAL CARE IF:  You have increasing pain or severe headaches.  You have nausea, vomiting, or drowsiness.  You have swelling around your face.  You have vision problems.  You have a stiff neck.  You have difficulty breathing. MAKE SURE YOU:   Understand these instructions.  Will watch your condition.  Will get help right away if you are not doing well or get worse. Document Released: 01/20/2005 Document Revised: 04/14/2011 Document Reviewed: 02/04/2011 [facility] Patient Information 2014 [place], [place].

## 2016-08-07 NOTE — Progress Notes (Signed)
Patient presents to clinic today c/o sinus pressure, post-nasal drip with sinus tenderness (frontal) over the past 2 weeks. States she has had a dull sinus headache over that time. Denies cough or chest congestion. Denies vision changes. Noted some dizziness near onset of symptoms associated with popping/pressure in the ears. Denies fever, chills. Notes fatigue. Denies recent travel or sick contact.   Patient also notes bloating and loose stool over the past few days. Denies abdominal pain but did have mild episode of lower abdominal cramping yesterday. Denies hematochezia, tenesmus or melena. Again denies fever, chills, recent travel or sick contact.Denies heartburn or reflux with taking her Protonix daily.Denies change in urinary habits. Trying to hydrate well. Avoiding the heat. Denies hematuria.   Past Medical History:  Diagnosis Date  . Allergic rhinitis   . Anxiety   . Arthritis    HANDS  . Benign liver cyst    multiple  . Chronic constipation   . Depression   . Diverticulosis of colon   . Emphysematous COPD ([place])   . External hemorrhoid   . GERD (gastroesophageal reflux disease)   . History of colon polyps   . History of Helicobacter pylori infection   . History of TIA (transient ischemic attack)    apr 2013-- no residual  . Hyperlipidemia   . Occipital neuralgia of left side 12/08/2014  . Osteoporosis   . Pancreatic cyst    simple benign  . PONV (postoperative nausea and vomiting)   . Prolapsed internal hemorrhoids, grade 3   . Simple renal cyst    right   . Wears glasses   . Wears partial dentures    UPPER    Current Outpatient Prescriptions on File Prior to Visit  Medication Sig Dispense Refill  . ALPRAZolam (XANAX) 0.5 MG tablet TAKE 1 TO 2 TABLETS BY MOUTH 3 TIMES A DAY AS NEEDED FOR ANXIETY 60 tablet 1  . aspirin 81 MG tablet Take 81 mg by mouth once a week.     . Calcium Carb-Cholecalciferol (CALCIUM 600 + D PO) Take 2 tablets by mouth daily.    . citalopram  (CELEXA) 20 MG tablet TAKE 2 TABLETS (40 MG TOTAL) BY MOUTH DAILY. 60 tablet 2  . cyanocobalamin 500 MCG tablet Take 500 mcg by mouth daily.    Marland Kitchen dicyclomine (BENTYL) 20 MG tablet TAKE ONE TABLET BY MOUTH THREE TIMES DAILY BETWEEN MEALS AS NEEDED. 30 tablet 2  . Docusate Calcium (STOOL SOFTENER PO) Take by mouth.    . ferrous sulfate 325 (65 FE) MG tablet TAKE 1 TABLET (325 MG TOTAL) BY MOUTH DAILY WITH BREAKFAST. 30 tablet 2  . fish oil-omega-3 fatty acids 1000 MG capsule Take 1 g by mouth See admin instructions. Three  times a week    . fluticasone (FLONASE) 50 MCG/ACT nasal spray Place 2 sprays into both nostrils daily. 16 g 6  . Multiple Vitamins-Minerals (MULTIVITAMIN WITH MINERALS) tablet Take 1 tablet by mouth 4 (four) times a week. Alternates with Caltrate    . pantoprazole (PROTONIX) 40 MG tablet TAKE 1 TABLET (40 MG TOTAL) BY MOUTH DAILY. 30 tablet 6  . simvastatin (ZOCOR) 20 MG tablet TAKE 1 TABLET (20 MG TOTAL) BY MOUTH AT BEDTIME. 30 tablet 5   No current facility-administered medications on file prior to visit.     Allergies  Allergen Reactions  . Erythromycin Other (See Comments)    "severe GI upset, also all "mycins" meds   . Flagyl [Metronidazole] Other (See Comments)    "  flu-like symptoms"    Family History  Problem Relation Age of Onset  . Alcoholism Father   . Diabetes type II Brother   . Heart attack Brother   . Multiple sclerosis Daughter   . Colon cancer Neg Hx     Social History   Social History  . Marital status: Married    Spouse name: Carloyn Manner  . Number of children: 2  . Years of education: 12   Occupational History  . [employer]    Social History Main Topics  . Smoking status: Former Smoker    Packs/day: 1.00    Years: 17.00    Types: Cigarettes    Quit date: 04/02/1988  . Smokeless tobacco: Never Used  . Alcohol use No  . Drug use: No  . Sexual activity: Not Asked   Other Topics Concern  . None    Social History Narrative   Patient lives at home wit her husband  Carloyn Manner). works for [employer]. Patient has high school education and two children. Caffeine 2-3 cups daily.   Review of Systems - See HPI.  All other ROS are negative.  BP (!) 120/50   Pulse 68   Temp 98.3 F (36.8 C) (Oral)   Resp 14   Ht _0  (1.549 m)   Wt 115 lb (52.2 kg)   LMP 06/16/1997   SpO2 98%   BMI 21.73 kg/m   Physical Exam  Constitutional: She is well-developed, well-nourished, and in no distress.  HENT:  Head: Normocephalic and atraumatic.  Left Ear: Tympanic membrane normal.  Nose: Mucosal edema and rhinorrhea present. Right sinus exhibits frontal sinus tenderness. Right sinus exhibits no maxillary sinus tenderness. Left sinus exhibits frontal sinus tenderness. Left sinus exhibits no maxillary sinus tenderness.  Mouth/Throat: Uvula is midline, oropharynx is clear and moist and mucous membranes are normal.  Scant serous fluid noted behind L TM.  Cardiovascular: Normal rate, regular rhythm, normal heart sounds and intact distal pulses.   Pulmonary/Chest: Effort normal and breath sounds normal. No respiratory distress. She has no wheezes. She has no rales. She exhibits no tenderness.  Abdominal: She exhibits no distension and no mass. Bowel sounds are hyperactive. There is no tenderness. There is no rebound and no guarding.  Vitals reviewed.  Assessment/Plan: 1. Acute bacterial sinusitis Rx Aomoxicillin.  Increase fluids.  Rest.  Saline nasal spray.  Probiotic.  Mucinex as directed.  Humidifier in bedroom.  Call or return to clinic if symptoms are not improving.   2. Loose stools Suspect secondary to this prolonged post-nasal drip. Start probiotic and keep a bland diet. OTC medications discussed. Labs today at patient's request.  - CBC w/Diff - Comp Met (CMET)  3. Urine frequency Urine dip with + blood. Will send for micro and culture. Patient with history of benign hematuria  mentioned at end of visit. Will see if micro confirms hematuria or false positive. - Urine Culture - Urine Microscopic Only - POCT Urinalysis Dipstick   Leeanne Rio, PA-C

## 2016-08-07 NOTE — Progress Notes (Signed)
Pre visit review using our clinic review tool, if applicable. No additional management support is needed unless otherwise documented below in the visit note. 

## 2016-08-09 LAB — URINE CULTURE

## 2016-08-12 ENCOUNTER — Other Ambulatory Visit: Payer: Self-pay | Admitting: Physician Assistant

## 2016-08-12 DIAGNOSIS — R197 Diarrhea, unspecified: Secondary | ICD-10-CM

## 2016-08-16 ENCOUNTER — Other Ambulatory Visit: Payer: Self-pay | Admitting: Family Medicine

## 2016-08-22 ENCOUNTER — Encounter: Payer: Self-pay | Admitting: Family Medicine

## 2016-08-22 ENCOUNTER — Ambulatory Visit (INDEPENDENT_AMBULATORY_CARE_PROVIDER_SITE_OTHER): Payer: Medicare Other | Admitting: Family Medicine

## 2016-08-22 VITALS — BP 121/81 | HR 59 | Temp 98.2°F | Resp 16 | Ht 61.0 in | Wt 118.0 lb

## 2016-08-22 DIAGNOSIS — L259 Unspecified contact dermatitis, unspecified cause: Secondary | ICD-10-CM

## 2016-08-22 MED ORDER — TRIAMCINOLONE ACETONIDE 0.1 % EX CREA: 1.0000 "application " | TOPICAL_CREAM | Freq: Two times a day (BID) | CUTANEOUS | 0 refills | Status: DC

## 2016-08-22 NOTE — Patient Instructions (Signed)
Follow up as needed/scheduled Use the Triamcinolone cream twice daily on the itchy areas Call with any questions or concerns Hang in there!!!

## 2016-08-22 NOTE — Progress Notes (Signed)
Pre visit review using our clinic review tool, if applicable. No additional management support is needed unless otherwise documented below in the visit note. 

## 2016-08-22 NOTE — Progress Notes (Signed)
   Subjective:    Patient ID: Regina Gonzalez, female    DOB: [birth year], 71 y.o.   MRN: [id number]  HPI Rash- pt has area on L forehead and R breast that is red and itchy.  No recent yard work, no exposure to pets.  Not painful.  Not consistent w/ previous shingles   Review of Systems For ROS see HPI     Objective:   Physical Exam  Constitutional: She is oriented to person, place, and time. She appears well-developed and well-nourished. No distress.  HENT:  Head: Normocephalic and atraumatic.  Neurological: She is alert and oriented to person, place, and time.  Skin: Skin is warm and dry. Rash (linear area on R breast consistent w/ plant dermatitis w/ scattered vesicles on erythematous base, L forehead more erythematous than vesicular) noted.  Psychiatric: She has a normal mood and affect. Her behavior is normal. Thought content normal.  Vitals reviewed.         Assessment & Plan:  Rash- most consistent w/ contact dermatitis, likely from brushing hair off forehead and scratching chest.  Start topical triamcinolone.  Reviewed supportive care and red flags that should prompt return.  Pt expressed understanding and is in agreement w/ plan.

## 2016-08-25 ENCOUNTER — Telehealth: Payer: Self-pay | Admitting: Family Medicine

## 2016-08-25 MED ORDER — PREDNISONE 10 MG PO TABS: ORAL_TABLET | ORAL | 0 refills | Status: DC

## 2016-08-25 NOTE — Telephone Encounter (Signed)
Please advise 

## 2016-08-25 NOTE — Telephone Encounter (Signed)
Pt informed and med filled to local pharmacy

## 2016-08-25 NOTE — Telephone Encounter (Signed)
Pt states that the poison ivy has gotten worse and asking what else can she do to get through this. Pt understands it has to run its course, however not sure what else she may be able to do, please advise

## 2016-08-25 NOTE — Telephone Encounter (Signed)
We can do a prednisone taper- 10mg  tablets, 3 tabs x3 days, then 2 tabs x3 days, then 1 tab x3 days

## 2016-09-02 ENCOUNTER — Other Ambulatory Visit: Payer: Self-pay | Admitting: Family Medicine

## 2016-09-02 NOTE — Telephone Encounter (Signed)
Last OV 08/22/16 Alprazolam last filled 03/26/16 #60 with 1

## 2016-09-03 NOTE — Telephone Encounter (Signed)
Medication filled to pharmacy as requested.   

## 2016-09-24 NOTE — Progress Notes (Signed)
Subjective:   Regina Gonzalez is a 71 y.o. female who presents for Medicare Annual (Subsequent) preventive examination.  Review of Systems:  No ROS.  Medicare Wellness Visit. Additional risk factors are reflected in the social history.  Cardiac Risk Factors include: dyslipidemia;advanced age (>7men, >52 women);family history of premature cardiovascular disease   Sleep patterns: Sleeps 8 hours, takes Xanax nightly.  Home Safety/Smoke Alarms: Feels safe in home. Smoke alarms in place.  Living environment; residence and Firearm Safety: Lives with husband and grandson in 1 story home.  Seat Belt Safety/Bike Helmet: Wears seat belt.   Female:   Pap-2012      Mammo-01/09/2016, self reported normal.       Dexa scan-09/26/2014, Osteoporosis. Plans to complete with mammo      [facility]-Colonoscopy 10/25/2013, normal. Recall 10 years.      Objective:     Vitals: BP 118/78   Pulse 76   Temp 98.1 F (36.7 C) (Oral)   Resp 16   Ht 5\' 1"  (1.549 m)   Wt 117 lb 8 oz (53.3 kg)   LMP 06/16/1997   SpO2 98%   BMI 22.20 kg/m   Body mass index is 22.2 kg/m.   Tobacco History  Smoking Status  . Former Smoker  . Packs/day: 1.00  . Years: 17.00  . Types: Cigarettes  . Quit date: 04/02/1988  Smokeless Tobacco  . Never Used     Counseling given: Yes   Past Medical History:  Diagnosis Date  . Allergic rhinitis   . Anxiety   . Arthritis    HANDS  . Benign liver cyst    multiple  . Chronic constipation   . Depression   . Diverticulosis of colon   . Emphysematous COPD ([facility])   . External hemorrhoid   . GERD (gastroesophageal reflux disease)   . History of colon polyps   . History of Helicobacter pylori infection   . History of TIA (transient ischemic attack)    apr 2013-- no residual  . Hyperlipidemia   . Occipital neuralgia of left side 12/08/2014  . Osteoporosis   . Pancreatic cyst    simple benign  . PONV (postoperative nausea and vomiting)   . Prolapsed internal  hemorrhoids, grade 3   . Simple renal cyst    right   . Wears glasses   . Wears partial dentures    UPPER   Past Surgical History:  Procedure Laterality Date  . APPENDECTOMY  1960  . COLONOSCOPY WITH PROPOFOL  last one 10-25-2013   polypectomy  . D & C HYSTEROSCOPY W/ ENDOMETRIAL POLYPECTOMY    . ESOPHAGOGASTRODUODENOSCOPY  last one 03-04-2013  . EXCISION BILATERAL BREAST CYST  1995  . HEMORRHOID SURGERY N/A 04/06/2014   Procedure: Hemmorhoidopexy with anal canal biopsy;  Surgeon: Romie Levee, MD;  [facility];  Service: General;  Laterality: N/A;  . LAPAROSCOPIC RIGHT OVARIAN CYSTECTOMY/  D & C HYSTEROSCOPY ENDOMETRIAL POLYPECTOMY  2011  . TONSILLECTOMY AND ADENOIDECTOMY  1955  . TRANSTHORACIC ECHOCARDIOGRAM  08-13-2012   normal LV/  ef 60-65%/  mild AR/  trivial MR   Family History  Problem Relation Age of Onset  . Alcoholism Father   . Diabetes type II Brother   . Heart attack Brother   . Multiple sclerosis Daughter   . Colon cancer Neg Hx    History  Sexual Activity  . Sexual activity: Not on file    Outpatient Encounter Prescriptions as of 09/25/2016  Medication  Sig  . ALPRAZolam (XANAX) 0.5 MG tablet TAKE 1 TO 2 TABLETS BY MOUTH 3 TIMES A DAY AS NEEDED FOR ANXIETY  . aspirin 81 MG tablet Take 81 mg by mouth once a week.   . Calcium Carb-Cholecalciferol (CALCIUM 600 + D PO) Take 2 tablets by mouth daily.  . citalopram (CELEXA) 20 MG tablet TAKE 2 TABLETS (40 MG TOTAL) BY MOUTH DAILY.  . cyanocobalamin 500 MCG tablet Take 500 mcg by mouth daily.  Marland Kitchen dicyclomine (BENTYL) 20 MG tablet TAKE ONE TABLET BY MOUTH THREE TIMES DAILY BETWEEN MEALS AS NEEDED.  Marland Kitchen Docusate Calcium (STOOL SOFTENER PO) Take by mouth.  . ferrous sulfate 325 (65 FE) MG tablet TAKE 1 TABLET (325 MG TOTAL) BY MOUTH DAILY WITH BREAKFAST.  . Multiple Vitamins-Minerals (MULTIVITAMIN WITH MINERALS) tablet Take 1 tablet by mouth 4 (four) times a week. Alternates with Caltrate  .  pantoprazole (PROTONIX) 40 MG tablet TAKE 1 TABLET (40 MG TOTAL) BY MOUTH DAILY.  . simvastatin (ZOCOR) 20 MG tablet TAKE 1 TABLET (20 MG TOTAL) BY MOUTH AT BEDTIME.  . [DISCONTINUED] fish oil-omega-3 fatty acids 1000 MG capsule Take 1 g by mouth See admin instructions. Three  times a week  . buPROPion (WELLBUTRIN XL) 150 MG 24 hr tablet Take 1 tablet (150 mg total) by mouth daily.  Marland Kitchen Zoster Vac Recomb Adjuvanted [facility]) injection Inject 0.5 mLs into the muscle once.  . [DISCONTINUED] fluticasone (FLONASE) 50 MCG/ACT nasal spray Place 2 sprays into both nostrils daily.  . [DISCONTINUED] predniSONE (DELTASONE) 10 MG tablet Take 3 tablets by mouth for 3 days, then 2 tablets by mouth for 3 days, then 1 tablet by mouth for 3 days.  . [DISCONTINUED] triamcinolone cream (KENALOG) 0.1 % Apply 1 application topically 2 (two) times daily.   No facility-administered encounter medications on file as of 09/25/2016.     Activities of Daily Living In your present state of health, do you have any difficulty performing the following activities: 09/25/2016 09/25/2016  Hearing? N N  Vision? N N  Difficulty concentrating or making decisions? N N  Walking or climbing stairs? N N  Dressing or bathing? N N  Doing errands, shopping? N N  Preparing Food and eating ? N -  Using the Toilet? N -  In the past six months, have you accidently leaked urine? N -  Do you have problems with loss of bowel control? N -  Managing your Medications? N -  Managing your Finances? N -  Housekeeping or managing your Housekeeping? N -  Some recent data might be hidden    Patient Care Team: Sheliah Hatch, MD as PCP - Arville Lime, MD as Consulting Physician (Gastroenterology) Romie Levee, MD as Consulting Physician (General Surgery)    Assessment:    Physical assessment deferred to PCP.  Exercise Activities and Dietary recommendations Current Exercise Habits: Home exercise routine (housekeeping,  shopping), Type of exercise: treadmill, Time (Minutes): 30, Frequency (Times/Week): 1, Weekly Exercise (Minutes/Week): 30, Exercise limited by: None identified   Diet (meal preparation, eat out, water intake, caffeinated beverages, dairy products, fruits and vegetables): Drinks water, coffee and OJ.   Breakfast: waffle with pb Lunch: salad, sandwich, fruit/yogurt Dinner: protein and vegetables.      Goals    . Increase physical activity          Increase activity by going to [facility].       Fall Risk Fall Risk  09/25/2016 09/25/2016 04/02/2016 09/25/2015 09/15/2014  Falls in  the past year? No No No No No   Depression Screen PHQ 2/9 Scores 09/25/2016 08/07/2016 04/02/2016 09/25/2015  PHQ - 2 Score 2 0 1 1  PHQ- 9 Score 2 0 1 -     Cognitive Function       Ad8 score reviewed for issues:  Issues making decisions:no  Less interest in hobbies / activities: no  Repeats questions, stories (family complaining): no  Trouble using ordinary gadgets (microwave, computer, phone): no  Forgets the month or year: no  Mismanaging finances: no  Remembering appts: no  Daily problems with thinking and/or memory: no Ad8 score is=o     Immunization History  Administered Date(s) Administered  . Influenza Split 01/08/2011  . Influenza Whole 11/03/2009  . Influenza,inj,Quad PF,6+ Mos 11/01/2012, 11/25/2013  . Pneumococcal Conjugate-13 09/23/2013  . Pneumococcal Polysaccharide-23 09/25/2015  . Td 02/04/2008   Screening Tests Health Maintenance  Topic Date Due  . INFLUENZA VACCINE  11/03/2016 (Originally 09/03/2016)  . MAMMOGRAM  01/08/2017  . TETANUS/TDAP  02/03/2018  . COLONOSCOPY  10/26/2023  . DEXA SCAN  Completed  . Hepatitis C Screening  Completed  . PNA vac Low Risk Adult  Completed      Plan:    Bring a copy of your living will and/or healthcare power of attorney to your next office visit.  Continue doing brain stimulating activities (puzzles, reading, adult coloring books,  staying active) to keep memory sharp.   Shingles vaccine at pharmacy.  Schedule bone scan with mammogram in December.    I have personally reviewed and noted the following in the patient's chart:   . Medical and social history . Use of alcohol, tobacco or illicit drugs  . Current medications and supplements . Functional ability and status . Nutritional status . Physical activity . Advanced directives . List of other physicians . Hospitalizations, surgeries, and ER visits in previous 12 months . Vitals . Screenings to include cognitive, depression, and falls . Referrals and appointments  In addition, I have reviewed and discussed with patient certain preventive protocols, quality metrics, and best practice recommendations. A written personalized care plan for preventive services as well as general preventive health recommendations were provided to patient.     Alysia Penna, RN  09/25/2016

## 2016-09-25 ENCOUNTER — Ambulatory Visit (INDEPENDENT_AMBULATORY_CARE_PROVIDER_SITE_OTHER): Payer: Medicare Other | Admitting: Family Medicine

## 2016-09-25 ENCOUNTER — Encounter: Payer: Self-pay | Admitting: Family Medicine

## 2016-09-25 VITALS — BP 118/78 | HR 76 | Temp 98.1°F | Resp 16 | Ht 61.0 in | Wt 117.5 lb

## 2016-09-25 DIAGNOSIS — E2839 Other primary ovarian failure: Secondary | ICD-10-CM | POA: Diagnosis not present

## 2016-09-25 DIAGNOSIS — Z0000 Encounter for general adult medical examination without abnormal findings: Secondary | ICD-10-CM

## 2016-09-25 DIAGNOSIS — E7800 Pure hypercholesterolemia, unspecified: Secondary | ICD-10-CM | POA: Diagnosis not present

## 2016-09-25 DIAGNOSIS — F330 Major depressive disorder, recurrent, mild: Secondary | ICD-10-CM | POA: Diagnosis not present

## 2016-09-25 DIAGNOSIS — Z23 Encounter for immunization: Secondary | ICD-10-CM

## 2016-09-25 DIAGNOSIS — R490 Dysphonia: Secondary | ICD-10-CM | POA: Diagnosis not present

## 2016-09-25 DIAGNOSIS — M810 Age-related osteoporosis without current pathological fracture: Secondary | ICD-10-CM

## 2016-09-25 LAB — CBC WITH DIFFERENTIAL/PLATELET
Basophils Absolute: 0 10*3/uL (ref 0.0–0.1)
Basophils Relative: 0.9 % (ref 0.0–3.0)
Eosinophils Absolute: 0.2 10*3/uL (ref 0.0–0.7)
Eosinophils Relative: 4 % (ref 0.0–5.0)
HCT: 40 % (ref 36.0–46.0)
Hemoglobin: 13.4 g/dL (ref 12.0–15.0)
Lymphocytes Relative: 36.5 % (ref 12.0–46.0)
Lymphs Abs: 1.5 10*3/uL (ref 0.7–4.0)
MCHC: 33.4 g/dL (ref 30.0–36.0)
MCV: 96.5 fl (ref 78.0–100.0)
Monocytes Absolute: 0.4 10*3/uL (ref 0.1–1.0)
Monocytes Relative: 10.4 % (ref 3.0–12.0)
Neutro Abs: 2 10*3/uL (ref 1.4–7.7)
Neutrophils Relative %: 48.2 % (ref 43.0–77.0)
Platelets: 240 10*3/uL (ref 150.0–400.0)
RBC: 4.15 Mil/uL (ref 3.87–5.11)
RDW: 14.4 % (ref 11.5–15.5)
WBC: 4.2 10*3/uL (ref 4.0–10.5)

## 2016-09-25 LAB — HEPATIC FUNCTION PANEL
ALT: 19 U/L (ref 0–35)
AST: 21 U/L (ref 0–37)
Albumin: 4.2 g/dL (ref 3.5–5.2)
Alkaline Phosphatase: 63 U/L (ref 39–117)
Bilirubin, Direct: 0.1 mg/dL (ref 0.0–0.3)
Total Bilirubin: 0.4 mg/dL (ref 0.2–1.2)
Total Protein: 6.3 g/dL (ref 6.0–8.3)

## 2016-09-25 LAB — BASIC METABOLIC PANEL
BUN: 12 mg/dL (ref 6–23)
CO2: 33 mEq/L — ABNORMAL HIGH (ref 19–32)
Calcium: 9.8 mg/dL (ref 8.4–10.5)
Chloride: 103 mEq/L (ref 96–112)
Creatinine, Ser: 0.96 mg/dL (ref 0.40–1.20)
GFR: 61.22 mL/min (ref >60.00)
Glucose, Bld: 84 mg/dL (ref 70–99)
Potassium: 4.2 mEq/L (ref 3.5–5.1)
Sodium: 141 mEq/L (ref 135–145)

## 2016-09-25 LAB — LIPID PANEL
Cholesterol: 185 mg/dL (ref 0–200)
HDL: 78.4 mg/dL (ref >39.00)
LDL Cholesterol: 97 mg/dL (ref 0–99)
NonHDL: 107.02
Total CHOL/HDL Ratio: 2
Triglycerides: 48 mg/dL (ref 0.0–149.0)
VLDL: 9.6 mg/dL (ref 0.0–40.0)

## 2016-09-25 LAB — VITAMIN D 25 HYDROXY (VIT D DEFICIENCY, FRACTURES): VITD: 33.89 ng/mL (ref 30.00–100.00)

## 2016-09-25 LAB — TSH: TSH: 2.74 u[IU]/mL (ref 0.35–4.50)

## 2016-09-25 MED ORDER — ZOSTER VAC RECOMB ADJUVANTED 50 MCG/0.5ML IM SUSR: 0.5000 mL | Freq: Once | INTRAMUSCULAR | 1 refills | Status: AC

## 2016-09-25 MED ORDER — BUPROPION HCL ER (XL) 150 MG PO TB24: 150.0000 mg | ORAL_TABLET | Freq: Every day | ORAL | 3 refills | Status: DC

## 2016-09-25 NOTE — Assessment & Plan Note (Signed)
Deteriorated w/ daughter's alcoholism, pancreatitis, and homelessness.  Restart Wellbutrin daily in addition to Celexa.  Will follow closely.

## 2016-09-25 NOTE — Patient Instructions (Addendum)
Follow up in 6-8 weeks to recheck mood We'll notify you of your lab results and make any changes if needed We'll call you with your ENT appt for the hoarseness Restart the Wellbutrin once daily in addition to the Celexa You are up to date on colonoscopy and mammogram- yay!!! Call with any questions or concerns Hang in there!  You're doing great!!!  Bring a copy of your living will and/or healthcare power of attorney to your next office visit.  Continue doing brain stimulating activities (puzzles, reading, adult coloring books, staying active) to keep memory sharp.   Shingles vaccine at pharmacy.  Schedule bone scan with mammogram in December.   Health Maintenance, Female Adopting a healthy lifestyle and getting preventive care can go a long way to promote health and wellness. Talk with your health care provider about what schedule of regular examinations is right for you. This is a good chance for you to check in with your provider about disease prevention and staying healthy. In between checkups, there are plenty of things you can do on your own. Experts have done a lot of research about which lifestyle changes and preventive measures are most likely to keep you healthy. Ask your health care provider for more information. Weight and diet Eat a healthy diet  Be sure to include plenty of vegetables, fruits, low-fat dairy products, and lean protein.  Do not eat a lot of foods high in solid fats, added sugars, or salt.  Get regular exercise. This is one of the most important things you can do for your health. ? Most adults should exercise for at least 150 minutes each week. The exercise should increase your heart rate and make you sweat (moderate-intensity exercise). ? Most adults should also do strengthening exercises at least twice a week. This is in addition to the moderate-intensity exercise.  Maintain a healthy weight  Body mass index (BMI) is a measurement that can be used to  identify possible weight problems. It estimates body fat based on height and weight. Your health care provider can help determine your BMI and help you achieve or maintain a healthy weight.  For females 54 years of age and older: ? A BMI below 18.5 is considered underweight. ? A BMI of 18.5 to 24.9 is normal. ? A BMI of 25 to 29.9 is considered overweight. ? A BMI of 30 and above is considered obese.  Watch levels of cholesterol and blood lipids  You should start having your blood tested for lipids and cholesterol at 69 years of age, then have this test every 5 years.  You may need to have your cholesterol levels checked more often if: ? Your lipid or cholesterol levels are high. ? You are older than 69 years of age. ? You are at high risk for heart disease.  Cancer screening Lung Cancer  Lung cancer screening is recommended for adults 40-57 years old who are at high risk for lung cancer because of a history of smoking.  A yearly low-dose CT scan of the lungs is recommended for people who: ? Currently smoke. ? Have quit within the past 15 years. ? Have at least a 30-pack-year history of smoking. A pack year is smoking an average of one pack of cigarettes a day for 1 year.  Yearly screening should continue until it has been 15 years since you quit.  Yearly screening should stop if you develop a health problem that would prevent you from having lung cancer treatment.  Breast  Cancer  Practice breast self-awareness. This means understanding how your breasts normally appear and feel.  It also means doing regular breast self-exams. Let your health care provider know about any changes, no matter how small.  If you are in your 20s or 30s, you should have a clinical breast exam (CBE) by a health care provider every 1-3 years as part of a regular health exam.  If you are 71 or older, have a CBE every year. Also consider having a breast X-ray (mammogram) every year.  If you have a  family history of breast cancer, talk to your health care provider about genetic screening.  If you are at high risk for breast cancer, talk to your health care provider about having an MRI and a mammogram every year.  Breast cancer gene (BRCA) assessment is recommended for women who have family members with BRCA-related cancers. BRCA-related cancers include: ? Breast. ? Ovarian. ? Tubal. ? Peritoneal cancers.  Results of the assessment will determine the need for genetic counseling and BRCA1 and BRCA2 testing.  Cervical Cancer Your health care provider may recommend that you be screened regularly for cancer of the pelvic organs (ovaries, uterus, and vagina). This screening involves a pelvic examination, including checking for microscopic changes to the surface of your cervix (Pap test). You may be encouraged to have this screening done every 3 years, beginning at age 83.  For women ages 81-65, health care providers may recommend pelvic exams and Pap testing every 3 years, or they may recommend the Pap and pelvic exam, combined with testing for human papilloma virus (HPV), every 5 years. Some types of HPV increase your risk of cervical cancer. Testing for HPV may also be done on women of any age with unclear Pap test results.  Other health care providers may not recommend any screening for nonpregnant women who are considered low risk for pelvic cancer and who do not have symptoms. Ask your health care provider if a screening pelvic exam is right for you.  If you have had past treatment for cervical cancer or a condition that could lead to cancer, you need Pap tests and screening for cancer for at least 20 years after your treatment. If Pap tests have been discontinued, your risk factors (such as having a new sexual partner) need to be reassessed to determine if screening should resume. Some women have medical problems that increase the chance of getting cervical cancer. In these cases, your  health care provider may recommend more frequent screening and Pap tests.  Colorectal Cancer  This type of cancer can be detected and often prevented.  Routine colorectal cancer screening usually begins at 69 years of age and continues through 69 years of age.  Your health care provider may recommend screening at an earlier age if you have risk factors for colon cancer.  Your health care provider may also recommend using home test kits to check for hidden blood in the stool.  A small camera at the end of a tube can be used to examine your colon directly (sigmoidoscopy or colonoscopy). This is done to check for the earliest forms of colorectal cancer.  Routine screening usually begins at age 75.  Direct examination of the colon should be repeated every 5-10 years through 69 years of age. However, you may need to be screened more often if early forms of precancerous polyps or small growths are found.  Skin Cancer  Check your skin from head to toe regularly.  Tell your  health care provider about any new moles or changes in moles, especially if there is a change in a mole's shape or color.  Also tell your health care provider if you have a mole that is larger than the size of a pencil eraser.  Always use sunscreen. Apply sunscreen liberally and repeatedly throughout the day.  Protect yourself by wearing long sleeves, pants, a wide-brimmed hat, and sunglasses whenever you are outside.  Heart disease, diabetes, and high blood pressure  High blood pressure causes heart disease and increases the risk of stroke. High blood pressure is more likely to develop in: ? People who have blood pressure in the high end of the normal range (130-139/85-89 mm Hg). ? People who are overweight or obese. ? People who are African American.  If you are 53-34 years of age, have your blood pressure checked every 3-5 years. If you are 66 years of age or older, have your blood pressure checked every year. You  should have your blood pressure measured twice-once when you are at a hospital or clinic, and once when you are not at a hospital or clinic. Record the average of the two measurements. To check your blood pressure when you are not at a hospital or clinic, you can use: ? An automated blood pressure machine at a pharmacy. ? A home blood pressure monitor.  If you are between 102 years and 2 years old, ask your health care provider if you should take aspirin to prevent strokes.  Have regular diabetes screenings. This involves taking a blood sample to check your fasting blood sugar level. ? If you are at a normal weight and have a low risk for diabetes, have this test once every three years after 69 years of age. ? If you are overweight and have a high risk for diabetes, consider being tested at a younger age or more often. Preventing infection Hepatitis B  If you have a higher risk for hepatitis B, you should be screened for this virus. You are considered at high risk for hepatitis B if: ? You were born in a country where hepatitis B is common. Ask your health care provider which countries are considered high risk. ? Your parents were born in a high-risk country, and you have not been immunized against hepatitis B (hepatitis B vaccine). ? You have HIV or AIDS. ? You use needles to inject street drugs. ? You live with someone who has hepatitis B. ? You have had sex with someone who has hepatitis B. ? You get hemodialysis treatment. ? You take certain medicines for conditions, including cancer, organ transplantation, and autoimmune conditions.  Hepatitis C  Blood testing is recommended for: ? Everyone born from [birth year] through 1963. ? Anyone with known risk factors for hepatitis C.  Sexually transmitted infections (STIs)  You should be screened for sexually transmitted infections (STIs) including gonorrhea and chlamydia if: ? You are sexually active and are younger than 69 years of age. ? You  are older than 69 years of age and your health care provider tells you that you are at risk for this type of infection. ? Your sexual activity has changed since you were last screened and you are at an increased risk for chlamydia or gonorrhea. Ask your health care provider if you are at risk.  If you do not have HIV, but are at risk, it may be recommended that you take a prescription medicine daily to prevent HIV infection. This is called pre-exposure prophylaxis (  PrEP). You are considered at risk if: ? You are sexually active and do not regularly use condoms or know the HIV status of your partner(s). ? You take drugs by injection. ? You are sexually active with a partner who has HIV.  Talk with your health care provider about whether you are at high risk of being infected with HIV. If you choose to begin PrEP, you should first be tested for HIV. You should then be tested every 3 months for as long as you are taking PrEP. Pregnancy  If you are premenopausal and you may become pregnant, ask your health care provider about preconception counseling.  If you may become pregnant, take 400 to 800 micrograms (mcg) of folic acid every day.  If you want to prevent pregnancy, talk to your health care provider about birth control (contraception). Osteoporosis and menopause  Osteoporosis is a disease in which the bones lose minerals and strength with aging. This can result in serious bone fractures. Your risk for osteoporosis can be identified using a bone density scan.  If you are 12 years of age or older, or if you are at risk for osteoporosis and fractures, ask your health care provider if you should be screened.  Ask your health care provider whether you should take a calcium or vitamin D supplement to lower your risk for osteoporosis.  Menopause may have certain physical symptoms and risks.  Hormone replacement therapy may reduce some of these symptoms and risks. Talk to your health care  provider about whether hormone replacement therapy is right for you. Follow these instructions at home:  Schedule regular health, dental, and eye exams.  Stay current with your immunizations.  Do not use any tobacco products including cigarettes, chewing tobacco, or electronic cigarettes.  If you are pregnant, do not drink alcohol.  If you are breastfeeding, limit how much and how often you drink alcohol.  Limit alcohol intake to no more than 1 drink per day for nonpregnant women. One drink equals 12 ounces of beer, 5 ounces of wine, or 1 ounces of hard liquor.  Do not use street drugs.  Do not share needles.  Ask your health care provider for help if you need support or information about quitting drugs.  Tell your health care provider if you often feel depressed.  Tell your health care provider if you have ever been abused or do not feel safe at home. This information is not intended to replace advice given to you by your health care provider. Make sure you discuss any questions you have with your health care provider. Document Released: 08/05/2010 Document Revised: 06/28/2015 Document Reviewed: 10/24/2014 Elsevier Interactive Patient Education  [employer].

## 2016-09-25 NOTE — Progress Notes (Signed)
   Subjective:    Patient ID: Regina Gonzalez, female    DOB: 1945-07-19, 71 y.o.   MRN: [id number]  HPI CPE- UTD on mammo, colonoscopy.     Review of Systems Patient reports no vision/ hearing changes, adenopathy,fever, weight change, swallowing issues, chest pain, palpitations, edema, persistant/recurrent cough, hemoptysis, dyspnea (rest/exertional/paroxysmal nocturnal), gastrointestinal bleeding (melena, rectal bleeding), abdominal pain, significant heartburn, bowel changes, GU symptoms (dysuria, hematuria, incontinence), Gyn symptoms (abnormal  bleeding, pain),  syncope, focal weakness, memory loss, numbness & tingling, skin/hair/nail changes, abnormal bruising or bleeding.   + anxiety/depression- worsening w/ daughter's health issues and homelessness + chronic hoarseness- evaluated by [facility] in 2015    Objective:   Physical Exam General Appearance:    Alert, cooperative, no distress, appears stated age  Head:    Normocephalic, without obvious abnormality, atraumatic  Eyes:    PERRL, conjunctiva/corneas clear, EOM's intact, fundi    benign, both eyes  Ears:    Normal TM's and external ear canals, both ears  Nose:   Nares normal, septum midline, mucosa normal, no drainage    or sinus tenderness  Throat:   Lips, mucosa, and tongue normal; teeth and gums normal  Neck:   Supple, symmetrical, trachea midline, no adenopathy;    Thyroid: no enlargement/tenderness/nodules  Back:     Symmetric, no curvature, ROM normal, no CVA tenderness  Lungs:     Clear to auscultation bilaterally, respirations unlabored  Chest Wall:    No tenderness or deformity   Heart:    Regular rate and rhythm, S1 and S2 normal, no murmur, rub   or gallop  Breast Exam:    Deferred to mammo  Abdomen:     Soft, non-tender, bowel sounds active all four quadrants,    no masses, no organomegaly  Genitalia:    Deferred  Rectal:    Extremities:   Extremities normal, atraumatic, no cyanosis or edema  Pulses:   2+ and  symmetric all extremities  Skin:   Skin color, texture, turgor normal, no rashes or lesions  Lymph nodes:   Cervical, supraclavicular, and axillary nodes normal  Neurologic:   CNII-XII intact, normal strength, sensation and reflexes    throughout          Assessment & Plan:  Reviewed MWV done by RN and agree w/ documentation.

## 2016-09-25 NOTE — Assessment & Plan Note (Signed)
Chronic problem.  Tolerating statin w/o difficulty.  Check labs.  Adjust meds prn  

## 2016-09-25 NOTE — Assessment & Plan Note (Signed)
Chronic problem.  Pt had evaluation in 2015 during which it was felt chronic GERD was the cause.  Pt is on daily PPI.  Refer back to ENT.  Pt expressed understanding and is in agreement w/ plan.

## 2016-09-25 NOTE — Progress Notes (Signed)
Pre visit review using our clinic review tool, if applicable. No additional management support is needed unless otherwise documented below in the visit note. 

## 2016-09-25 NOTE — Assessment & Plan Note (Signed)
Pt's PE WNL.  UTD on mammo, colonoscopy, and immunizations.  Declines flu shot.  Check labs.  Anticipatory guidance provided.

## 2016-09-26 ENCOUNTER — Encounter: Payer: Self-pay | Admitting: General Practice

## 2016-10-21 ENCOUNTER — Encounter: Payer: Medicare Other | Admitting: Family Medicine

## 2016-11-02 ENCOUNTER — Other Ambulatory Visit: Payer: Self-pay | Admitting: Family Medicine

## 2016-11-10 ENCOUNTER — Other Ambulatory Visit: Payer: Self-pay | Admitting: Family Medicine

## 2016-11-10 ENCOUNTER — Telehealth: Payer: Self-pay | Admitting: *Deleted

## 2016-11-10 DIAGNOSIS — Z1231 Encounter for screening mammogram for malignant neoplasm of breast: Secondary | ICD-10-CM

## 2016-11-10 MED ORDER — ALPRAZOLAM 0.5 MG PO TABS: ORAL_TABLET | ORAL | 0 refills | Status: DC

## 2016-11-10 NOTE — Telephone Encounter (Signed)
Patient aware. Medication has been faxed to [facility] on [address] per patient request.

## 2016-11-10 NOTE — Telephone Encounter (Signed)
Reviewing in PCP absence. Have printed and sign refill for patient. Please fax to pharmacy. I hope her family is doing well.

## 2016-11-10 NOTE — Telephone Encounter (Signed)
Patient is calling in stating that she has been with family in the hospital and she forgot to request a refill on her Xanax.   She is asking if there is any possibility of her getting this sent to the pharmacy today.  Patient is aware that PCP is out, and that I will have to see if this is something another provider can do for her.  Patient stated verbal understanding and asked for a call back to let her know if it can be done.

## 2016-12-01 ENCOUNTER — Telehealth: Payer: Self-pay | Admitting: Family Medicine

## 2016-12-01 NOTE — Telephone Encounter (Signed)
Noted.  Thank you for your kind words to the pt in her time of need.

## 2016-12-01 NOTE — Telephone Encounter (Signed)
Pt called in to ask which OB-Gyn she went to yrs ago, pt states that she could not remember and asked if we had a way to tell her. Pt also states that her youngest daughter just passed away.

## 2016-12-01 NOTE — Telephone Encounter (Signed)
I was able to locate the referral for her and give her the Provider's name and phone number.   She is going to contact them as she saw Dr. Seymour BarsLavoie last year.     I extended our condolences to her family in the passing of her daughter.  I let her know if she needs us, we are available.  She appreciated this and thanked me for calling her back.   Routed to provider just as an [place] -

## 2016-12-03 ENCOUNTER — Ambulatory Visit (INDEPENDENT_AMBULATORY_CARE_PROVIDER_SITE_OTHER): Payer: Medicare Other | Admitting: Family Medicine

## 2016-12-03 ENCOUNTER — Encounter: Payer: Self-pay | Admitting: Family Medicine

## 2016-12-03 VITALS — BP 123/71 | HR 80 | Temp 97.9°F | Resp 16 | Ht 61.0 in | Wt 116.1 lb

## 2016-12-03 DIAGNOSIS — F331 Major depressive disorder, recurrent, moderate: Secondary | ICD-10-CM

## 2016-12-03 DIAGNOSIS — M546 Pain in thoracic spine: Secondary | ICD-10-CM

## 2016-12-03 DIAGNOSIS — Z23 Encounter for immunization: Secondary | ICD-10-CM | POA: Diagnosis not present

## 2016-12-03 DIAGNOSIS — G8929 Other chronic pain: Secondary | ICD-10-CM

## 2016-12-03 MED ORDER — CITALOPRAM HYDROBROMIDE 20 MG PO TABS: 40.0000 mg | ORAL_TABLET | Freq: Every day | ORAL | 2 refills | Status: DC

## 2016-12-03 MED ORDER — BUPROPION HCL ER (XL) 300 MG PO TB24: 300.0000 mg | ORAL_TABLET | Freq: Every day | ORAL | 3 refills | Status: DC

## 2016-12-03 NOTE — Patient Instructions (Signed)
Follow up in 3-4 weeks to recheck mood Continue the Citalopram 2 tabs daily Increase the Wellbutrin to 300mg  daily- 2 tabs of what you currently have and 1 of the new prescription We'll call you with your Sports Med appt to investigate your back pain Call with any questions or concerns Hang in there!

## 2016-12-03 NOTE — Progress Notes (Signed)
   Subjective:    Patient ID: Regina Gonzalez, female    DOB: [birth year], 71 y.o.   MRN: [id number]  HPI Back pain- R sided, just lateral to spine and under ribs.  No rash, no bruising, no mass but pt reports it has been present for ~1 yr and is very tender to touch.  Painful to lean against car seat.  Back is hurting more frequently.  Pain is described as an ache.  Depression- daughter recently passed away after being found unresponsive in jail.  The thought is that she died of necrotizing pancreatitis- waiting on medical examiner's report.  Pt is thinking she needs to increase her Wellbutrin temporarily.  This is a good idea   Review of Systems For ROS see HPI     Objective:   Physical Exam  Constitutional: She is oriented to person, place, and time. She appears well-developed and well-nourished. No distress.  HENT:  Head: Normocephalic and atraumatic.  Cardiovascular: Normal rate, regular rhythm and normal heart sounds.   Pulmonary/Chest: Effort normal and breath sounds normal. No respiratory distress. She has no wheezes. She has no rales.  Musculoskeletal: She exhibits tenderness (mild TTP over R thoracic back just to the R of the spine and below the 10th rib.  no soft tissue mass or bony tenderness- pain worsens w/ forward flexion suggesting muscular issue).  Neurological: She is alert and oriented to person, place, and time.  Skin: Skin is warm and dry.  Psychiatric: She has a normal mood and affect. Her behavior is normal. Thought content normal.  Vitals reviewed.         Assessment & Plan:  Depression- deteriorated w/ death of daughter recently.  Will increase Wellbutrin to 300mg  while continuing the Celexa.  Encouraged pt to reach out to Hospice.  Will follow.  Back pain- ongoing for pt.  No obvious bony or soft tissue abnormality.  Suspect muscular issue.  Will refer to sports med for complete evaluation.  Pt expressed understanding and is in agreement w/ plan.

## 2016-12-05 ENCOUNTER — Ambulatory Visit (INDEPENDENT_AMBULATORY_CARE_PROVIDER_SITE_OTHER): Payer: Medicare Other | Admitting: Obstetrics & Gynecology

## 2016-12-05 ENCOUNTER — Encounter: Payer: Self-pay | Admitting: Obstetrics & Gynecology

## 2016-12-05 VITALS — BP 140/80

## 2016-12-05 DIAGNOSIS — M545 Low back pain, unspecified: Secondary | ICD-10-CM

## 2016-12-05 DIAGNOSIS — R319 Hematuria, unspecified: Secondary | ICD-10-CM | POA: Diagnosis not present

## 2016-12-05 DIAGNOSIS — N8110 Cystocele, unspecified: Secondary | ICD-10-CM

## 2016-12-05 NOTE — Progress Notes (Signed)
    Regina ModestRhonda C Gonzalez [birth year] [id number]        71 y.o.  [id number]   RP:  Discomfort in vagina and Left back pain x a few weeks  HPI:  C/O pressure sensation in vagina x a few weeks, worse when active.  Also c/o left mid/lower back pain x 2 weeks, no irradiation to legs.  No PMB.  No pelvic pain.  No SUI. No UTI Sx.  No h/o kidney stones.  BMs wnl.  Past medical history,surgical history, problem list, medications, allergies, family history and social history were all reviewed and documented in the EPIC chart.  Directed ROS with pertinent positives and negatives documented in the history of present illness/assessment and plan.  Exam:  Vitals:   12/05/16 1237  BP: 140/80   General appearance:  Normal  Abdomen:  Normal, soft, not distended, NT  CVAT Rt negative.  Lt positive  Gyn exam:  Vulva normal.  Speculum:  Normal cervix/vagina.  Bimanual:  Uterus AV, normal volume, NT, mobile.  No adnexal mass.  Vaginal exam in standing position with Valsalva:  Cystocele grade 3/4.  Mild uterine prolapse grade 1/3.  No rectocele.  U/A RBC 10-20/hpf.  Otherwise negative.   Assessment/Plan:  71 y.o. [id number]   1. Baden-Walker grade 3 cystocele Mildly symptomatic, not causing pain.  Able to perform all her activities normally.  No SUI.  Recommendations to avoid pelvic floor pressure discussed including treating constipation, cough, avoiding heavy lifting and emptying bladder before it is too full.  Pessary management vs corrective surgery reviewed.  Will observe at this time.  2. Hematuria, unspecified type Significant hematuria.  No evidence of Cystitis, but Urine culture sent.  No h/o renal stones.  But CVAT on Left.  Consultation with Urology to investigate and manage. - Urinalysis with Culture Reflex  3. Acute left-sided low back pain without sciatica CVAT positive with Hematuria.  Renal lithiasis?  Other renal pathology?  Consultation with Urology.   Counseling on above issues >50% x 30  minutes  Genia DelMarie-Lyne Latroy Gaymon MD, 12:56 PM 12/05/2016

## 2016-12-06 LAB — URINALYSIS W MICROSCOPIC + REFLEX CULTURE
Bacteria, UA: NONE SEEN /HPF
Bilirubin Urine: NEGATIVE
Glucose, UA: NEGATIVE
Hyaline Cast: NONE SEEN /LPF
Ketones, ur: NEGATIVE
Leukocyte Esterase: NEGATIVE
Nitrites, Initial: NEGATIVE
Protein, ur: NEGATIVE
Specific Gravity, Urine: 1.007 (ref 1.001–1.03)
WBC, UA: NONE SEEN /HPF (ref 0–5)
pH: 5.5 (ref 5.0–8.0)

## 2016-12-06 LAB — NO CULTURE INDICATED

## 2016-12-07 ENCOUNTER — Encounter: Payer: Self-pay | Admitting: Obstetrics & Gynecology

## 2016-12-07 NOTE — Patient Instructions (Signed)
1. Baden-Walker grade 3 cystocele Mildly symptomatic, not causing pain.  Able to perform all her activities normally.  No SUI.  Recommendations to avoid pelvic floor pressure discussed including treating constipation, cough, avoiding heavy lifting and emptying bladder before it is too full.  Pessary management vs corrective surgery reviewed.  Will observe at this time.  2. Hematuria, unspecified type Significant hematuria.  No evidence of Cystitis, but Urine culture sent.  No h/o renal stones.  But CVAT on Left.  Consultation with Urology to investigate and manage. - Urinalysis with Culture Reflex  3. Acute left-sided low back pain without sciatica CVAT positive with Hematuria.  Renal lithiasis?  Other renal pathology?  Consultation with Urology.  Regina Gonzalez, it was a pleasure meeting you today!  I will inform you of your results as soon as available.  You will receive a phone call to organize your consultation with Urology.

## 2016-12-16 ENCOUNTER — Other Ambulatory Visit: Payer: Self-pay | Admitting: Family Medicine

## 2016-12-16 NOTE — Telephone Encounter (Signed)
Medication filled to pharmacy as requested.   

## 2016-12-16 NOTE — Telephone Encounter (Signed)
Last OV 12/03/16 Alprazolam last filled 11/10/16 #60 with 0

## 2017-01-02 ENCOUNTER — Ambulatory Visit (INDEPENDENT_AMBULATORY_CARE_PROVIDER_SITE_OTHER): Payer: Medicare Other | Admitting: Physician Assistant

## 2017-01-02 ENCOUNTER — Encounter: Payer: Self-pay | Admitting: Physician Assistant

## 2017-01-02 VITALS — BP 130/60 | HR 70 | Temp 98.0°F | Resp 14 | Ht 61.0 in | Wt 115.0 lb

## 2017-01-02 DIAGNOSIS — J019 Acute sinusitis, unspecified: Secondary | ICD-10-CM

## 2017-01-02 DIAGNOSIS — B9689 Other specified bacterial agents as the cause of diseases classified elsewhere: Secondary | ICD-10-CM | POA: Diagnosis not present

## 2017-01-02 MED ORDER — AMOXICILLIN-POT CLAVULANATE 875-125 MG PO TABS: 1.0000 | ORAL_TABLET | Freq: Two times a day (BID) | ORAL | 0 refills | Status: DC

## 2017-01-02 NOTE — Progress Notes (Signed)
Patient presents to clinic today c/o 2 weeks of sinus pressure, nasal congestion with 1 week of sinus pain, fatigue, thick rhinorrhea. Notes R ear pain since this morning. Denies fever, chills, chest congestion or SOB. Occasional dry cough. Has not taken anything for symptoms.   Past Medical History:  Diagnosis Date  . Allergic rhinitis   . Anxiety   . Arthritis    HANDS  . Benign liver cyst    multiple  . Chronic constipation   . Depression   . Diverticulosis of colon   . Emphysematous COPD (HCC)   . External hemorrhoid   . GERD (gastroesophageal reflux disease)   . History of colon polyps   . History of Helicobacter pylori infection   . History of TIA (transient ischemic attack)    apr 2013-- no residual  . Hyperlipidemia   . Occipital neuralgia of left side 12/08/2014  . Osteoporosis   . Pancreatic cyst    simple benign  . PONV (postoperative nausea and vomiting)   . Prolapsed internal hemorrhoids, grade 3   . Simple renal cyst    right   . Wears glasses   . Wears partial dentures    UPPER    Current Outpatient Medications on File Prior to Visit  Medication Sig Dispense Refill  . ALPRAZolam (XANAX) 0.5 MG tablet TAKE 1-2 TABLETS BY MOUTH 3 TIMES A DAY AS NEEDED FOR ANXIETY 60 tablet 1  . aspirin 81 MG tablet Take 81 mg by mouth once a week.     Marland Kitchen buPROPion (WELLBUTRIN XL) 300 MG 24 hr tablet Take 1 tablet (300 mg total) by mouth daily. 30 tablet 3  . Calcium Carb-Cholecalciferol (CALCIUM 600 + D PO) Take 2 tablets by mouth daily.    . citalopram (CELEXA) 20 MG tablet Take 2 tablets (40 mg total) by mouth daily. 60 tablet 2  . cyanocobalamin 500 MCG tablet Take 500 mcg by mouth daily.    Marland Kitchen dicyclomine (BENTYL) 20 MG tablet TAKE ONE TABLET BY MOUTH THREE TIMES DAILY BETWEEN MEALS AS NEEDED. 30 tablet 2  . Docusate Calcium (STOOL SOFTENER PO) Take by mouth.    . ferrous sulfate 325 (65 FE) MG tablet TAKE 1 TABLET (325 MG TOTAL) BY MOUTH DAILY WITH BREAKFAST. 30 tablet  2  . Multiple Vitamins-Minerals (MULTIVITAMIN WITH MINERALS) tablet Take 1 tablet by mouth 4 (four) times a week. Alternates with Caltrate    . pantoprazole (PROTONIX) 40 MG tablet TAKE 1 TABLET (40 MG TOTAL) BY MOUTH DAILY. 30 tablet 6  . simvastatin (ZOCOR) 20 MG tablet TAKE 1 TABLET (20 MG TOTAL) BY MOUTH AT BEDTIME. 30 tablet 5   No current facility-administered medications on file prior to visit.     Allergies  Allergen Reactions  . Erythromycin Other (See Comments)    "severe GI upset, also all "mycins" meds   . Flagyl [Metronidazole] Other (See Comments)    "flu-like symptoms"    Family History  Problem Relation Age of Onset  . Alcoholism Father   . Diabetes type II Brother   . Heart attack Brother   . Multiple sclerosis Daughter   . Pancreatitis Daughter   . Colon cancer Neg Hx     Social History   Socioeconomic History  . Marital status: Married    Spouse name: Channing Mutters  . Number of children: 2  . Years of education: 49  . Highest education level: None  Social Needs  . Financial resource strain: None  .  Food insecurity - worry: None  . Food insecurity - inability: None  . Transportation needs - medical: None  . Transportation needs - non-medical: None  Occupational History  . Occupation: [employer]     Employer: [employer]    Comment: Hair Salon   Tobacco Use  . Smoking status: Former Smoker    Packs/day: 1.00    Years: 17.00    Pack years: 17.00    Types: Cigarettes    Last attempt to quit: 04/02/1988    Years since quitting: 28.7  . Smokeless tobacco: Never Used  Substance and Sexual Activity  . Alcohol use: No  . Drug use: No  . Sexual activity: None  Other Topics Concern  . None  Social History Narrative   Patient lives at home wit her husband  Channing Mutters(Roy). works for [employer]. Patient has high school education and two children. Caffeine 2-3 cups daily.   Review of Systems - See HPI.  All other ROS are negative.  BP  130/60   Pulse 70   Temp 98 F (36.7 C) (Oral)   Resp 14   Ht 5\' 1"  (1.549 m)   Wt 115 lb (52.2 kg)   LMP 06/16/1997   SpO2 98%   BMI 21.73 kg/m   Physical Exam  Constitutional: She is oriented to person, place, and time and well-developed, well-nourished, and in no distress.  HENT:  Head: Normocephalic and atraumatic.  Right Ear: Tympanic membrane normal.  Left Ear: Tympanic membrane normal.  Nose: Mucosal edema and rhinorrhea present. Right sinus exhibits maxillary sinus tenderness and frontal sinus tenderness. Left sinus exhibits no maxillary sinus tenderness and no frontal sinus tenderness.  Mouth/Throat: Uvula is midline, oropharynx is clear and moist and mucous membranes are normal.  Eyes: Conjunctivae are normal.  Neck: Neck supple.  Cardiovascular: Normal rate, regular rhythm, normal heart sounds and intact distal pulses.  Pulmonary/Chest: Effort normal and breath sounds normal. No respiratory distress. She has no wheezes. She has no rales. She exhibits no tenderness.  Neurological: She is alert and oriented to person, place, and time.  Skin: Skin is warm and dry. No rash noted.  Psychiatric: Affect normal.  Vitals reviewed.  Recent Results (from the past 2160 hour(s))  Urinalysis with Culture Reflex     Status: Abnormal   Collection Time: 12/05/16 12:54 PM  Result Value Ref Range   Color, Urine YELLOW YELLOW   APPearance CLEAR CLEAR   Specific Gravity, Urine 1.007 1.001 - 1.03    Comment: Verified by repeat analysis. .    pH 5.5 5.0 - 8.0   Glucose, UA NEGATIVE NEGATIVE   Bilirubin Urine NEGATIVE NEGATIVE   Ketones, ur NEGATIVE NEGATIVE   Hgb urine dipstick 2+ (A) NEGATIVE   Protein, ur NEGATIVE NEGATIVE   Nitrites, Initial NEGATIVE NEGATIVE   Leukocyte Esterase NEGATIVE NEGATIVE   WBC, UA NONE SEEN 0 - 5 /HPF   RBC / HPF 10-20 (A) 0 - 2 /HPF   Squamous Epithelial / LPF 0-5 < OR = 5 /HPF   Bacteria, UA NONE SEEN NONE SEEN /HPF   Hyaline Cast NONE SEEN NONE  SEEN /LPF   Urine-Other      Comment: URINE CULTURE PENDING   Note      Comment: This urine was analyzed for the presence of WBC,  RBC, bacteria, casts, and other formed elements.  Only those elements seen were reported. . .   REFLEXIVE URINE CULTURE  Status: None   Collection Time: 12/05/16 12:54 PM  Result Value Ref Range   Reflexve Urine Culture NO CULTURE INDICATED     Assessment/Plan: 1. Acute bacterial sinusitis Rx augmentin.  Increase fluids.  Rest.  Saline nasal spray.  Probiotic.  Mucinex as directed.  Humidifier in bedroom. Restart Flonase.  Call or return to clinic if symptoms are not improving.  - amoxicillin-clavulanate (AUGMENTIN) 875-125 MG tablet; Take 1 tablet by mouth 2 (two) times daily.  Dispense: 14 tablet; Refill: 0   Piedad ClimesWilliam Cody Hadden Steig, PA-C

## 2017-01-02 NOTE — Progress Notes (Signed)
Pre visit review using our clinic review tool, if applicable. No additional management support is needed unless otherwise documented below in the visit note. 

## 2017-01-02 NOTE — Patient Instructions (Signed)
Please take antibiotic as directed.  Increase fluid intake.  Use Saline nasal spray.  Take a daily multivitamin. Restart Flonase.  Place a humidifier in the bedroom.  Please call or return clinic if symptoms are not improving.  Sinusitis Sinusitis is redness, soreness, and swelling (inflammation) of the paranasal sinuses. Paranasal sinuses are air pockets within the bones of your face (beneath the eyes, the middle of the forehead, or above the eyes). In healthy paranasal sinuses, mucus is able to drain out, and air is able to circulate through them by way of your nose. However, when your paranasal sinuses are inflamed, mucus and air can become trapped. This can allow bacteria and other germs to grow and cause infection. Sinusitis can develop quickly and last only a short time (acute) or continue over a long period (chronic). Sinusitis that lasts for more than 12 weeks is considered chronic.  CAUSES  Causes of sinusitis include:  Allergies.  Structural abnormalities, such as displacement of the cartilage that separates your nostrils (deviated septum), which can decrease the air flow through your nose and sinuses and affect sinus drainage.  Functional abnormalities, such as when the small hairs (cilia) that line your sinuses and help remove mucus do not work properly or are not present. SYMPTOMS  Symptoms of acute and chronic sinusitis are the same. The primary symptoms are pain and pressure around the affected sinuses. Other symptoms include:  Upper toothache.  Earache.  Headache.  Bad breath.  Decreased sense of smell and taste.  A cough, which worsens when you are lying flat.  Fatigue.  Fever.  Thick drainage from your nose, which often is green and may contain pus (purulent).  Swelling and warmth over the affected sinuses. DIAGNOSIS  Your caregiver will perform a physical exam. During the exam, your caregiver may:  Look in your nose for signs of abnormal growths in your  nostrils (nasal polyps).  Tap over the affected sinus to check for signs of infection.  View the inside of your sinuses (endoscopy) with a special imaging device with a light attached (endoscope), which is inserted into your sinuses. If your caregiver suspects that you have chronic sinusitis, one or more of the following tests may be recommended:  Allergy tests.  Nasal culture A sample of mucus is taken from your nose and sent to a lab and screened for bacteria.  Nasal cytology A sample of mucus is taken from your nose and examined by your caregiver to determine if your sinusitis is related to an allergy. TREATMENT  Most cases of acute sinusitis are related to a viral infection and will resolve on their own within 10 days. Sometimes medicines are prescribed to help relieve symptoms (pain medicine, decongestants, nasal steroid sprays, or saline sprays).  However, for sinusitis related to a bacterial infection, your caregiver will prescribe antibiotic medicines. These are medicines that will help kill the bacteria causing the infection.  Rarely, sinusitis is caused by a fungal infection. In theses cases, your caregiver will prescribe antifungal medicine. For some cases of chronic sinusitis, surgery is needed. Generally, these are cases in which sinusitis recurs more than 3 times per year, despite other treatments. HOME CARE INSTRUCTIONS   Drink plenty of water. Water helps thin the mucus so your sinuses can drain more easily.  Use a humidifier.  Inhale steam 3 to 4 times a day (for example, sit in the bathroom with the shower running).  Apply a warm, moist washcloth to your face 3 to 4 times   a day, or as directed by your caregiver.  Use saline nasal sprays to help moisten and clean your sinuses.  Take over-the-counter or prescription medicines for pain, discomfort, or fever only as directed by your caregiver. SEEK IMMEDIATE MEDICAL CARE IF:  You have increasing pain or severe  headaches.  You have nausea, vomiting, or drowsiness.  You have swelling around your face.  You have vision problems.  You have a stiff neck.  You have difficulty breathing. MAKE SURE YOU:   Understand these instructions.  Will watch your condition.  Will get help right away if you are not doing well or get worse. Document Released: 01/20/2005 Document Revised: 04/14/2011 Document Reviewed: 02/04/2011 ExitCare Patient Information 2014 [employer].   

## 2017-01-07 ENCOUNTER — Telehealth: Payer: Self-pay | Admitting: *Deleted

## 2017-01-07 NOTE — Telephone Encounter (Signed)
Per note on 12/05/16 "CVAT positive with Hematuria.  Renal lithiasis?  Other renal pathology?  Consultation with Urology.  Pt scheduled on 01/05/17 @ 9:00am with Dr.MacDiarmid

## 2017-01-09 ENCOUNTER — Ambulatory Visit: Payer: Medicare Other

## 2017-02-10 ENCOUNTER — Ambulatory Visit: Payer: Medicare Other

## 2017-02-12 ENCOUNTER — Encounter: Payer: Self-pay | Admitting: *Deleted

## 2017-02-12 NOTE — Telephone Encounter (Signed)
Patient's appointment is with Malva Coganody Martin

## 2017-02-12 NOTE — Telephone Encounter (Signed)
Pt reports being "very fatigued, no energy" for 1 week. States "achy all over." Having to nap during the day "which is very unusual for me." Has to stop and rest with ADLs. Afebrile, denies cough, SOB, dizziness;  no vomiting or diarrhea. "Dull headache." Generalized weakness and malaise. Pt also states her daughter died in Oct. 2018. Appt made for tomorrow 02/11/17 with C. AltaMani, PAC. Home care advise given.  Reason for Disposition . [1] Fatigue (i.e., tires easily, decreased energy) AND [2] persists > 1 week  Answer Assessment - Initial Assessment Questions 1. DESCRIPTION: "Describe how you are feeling."     Much more tired than usual, generalized weakness,achy,"no energy, very unusual for me." Also states her daughter died in Oct. 2018 2. SEVERITY: "How bad is it?"  "Can you stand and walk?"   - MILD - Feels weak or tired, but does not interfere with work, school or normal activities   - MODERATE - Able to stand and walk; weakness interferes with work, school, or normal activities   - SEVERE - Unable to stand or walk     Moderate, has to stop and rest with ADLs 3. ONSET:  "When did the weakness begin?"     02/05/17 4. CAUSE: "What do you think is causing the weakness?"     Unsure 5. MEDICINES: "Have you recently started a new medicine or had a change in the amount of a medicine?"     no 6. OTHER SYMPTOMS: "Do you have any other symptoms?" (e.g., chest pain, fever, cough, SOB, vomiting, diarrhea, bleeding)    Achy all over, dull headache, no fever temp checked during call 97.0  Protocols used: WEAKNESS (GENERALIZED) AND FATIGUE-A-AH

## 2017-02-12 NOTE — Telephone Encounter (Signed)
This encounter was created in error - please disregard.

## 2017-02-13 ENCOUNTER — Ambulatory Visit: Payer: Self-pay | Admitting: Physician Assistant

## 2017-02-18 ENCOUNTER — Telehealth: Payer: Self-pay | Admitting: Family Medicine

## 2017-02-18 NOTE — Telephone Encounter (Signed)
Called pt and left a message to advise of PCP recommendations. Will await a phone call back with information from pt on what medication is preferred with her formulary.

## 2017-02-18 NOTE — Telephone Encounter (Signed)
I have not seen anything either.  I would recommend she call her insurance and see if there's a preferred medication

## 2017-02-18 NOTE — Telephone Encounter (Signed)
Please advise? I have not received anything for a PA

## 2017-02-18 NOTE — Telephone Encounter (Signed)
Copied from CRM [id number]. Topic: Quick Communication - See Telephone Encounter >> Feb 18, 2017 10:58 AM Louie BunPalacios Medina, Rosey Batheresa D wrote: Patient called and said that her medicaiton buPROPion (WELLBUTRIN XL) 300 MG 24 hr tablet was high on cost with her new insurance and she would like to know if Dr. Beverely Lowabori can help her out with this. Please call patient back, thanks. CRM for notification. See Telephone encounter for: 02/18/17.

## 2017-02-20 NOTE — Telephone Encounter (Signed)
Called pt again today. Closing encounter until pt returns call.  

## 2017-02-27 ENCOUNTER — Ambulatory Visit
Admission: RE | Admit: 2017-02-27 | Discharge: 2017-02-27 | Disposition: A | Discharge status: Home / Self Care | Payer: Medicare HMO | Source: Ambulatory Visit | Attending: Family Medicine | Admitting: Family Medicine

## 2017-02-27 DIAGNOSIS — Z1231 Encounter for screening mammogram for malignant neoplasm of breast: Secondary | ICD-10-CM | POA: Diagnosis not present

## 2017-03-05 ENCOUNTER — Ambulatory Visit: Payer: Medicare HMO | Admitting: Family Medicine

## 2017-03-11 ENCOUNTER — Other Ambulatory Visit: Payer: Self-pay | Admitting: General Practice

## 2017-03-11 MED ORDER — PANTOPRAZOLE SODIUM 40 MG PO TBEC: 40.0000 mg | DELAYED_RELEASE_TABLET | Freq: Every day | ORAL | 6 refills | Status: DC

## 2017-03-18 ENCOUNTER — Other Ambulatory Visit: Payer: Self-pay | Admitting: Physician Assistant

## 2017-03-18 NOTE — Telephone Encounter (Signed)
Will defer further refills of patient's medications to PCP  

## 2017-03-23 ENCOUNTER — Other Ambulatory Visit: Payer: Self-pay | Admitting: Family Medicine

## 2017-03-23 MED ORDER — BUPROPION HCL ER (XL) 300 MG PO TB24: 300.0000 mg | ORAL_TABLET | Freq: Every day | ORAL | 0 refills | Status: DC

## 2017-03-23 NOTE — Addendum Note (Signed)
Addended by: Geannie RisenBRODMERKEL, Wonder Donaway L on: 03/23/2017 09:25 AM   Modules accepted: Orders

## 2017-03-25 ENCOUNTER — Other Ambulatory Visit: Payer: Self-pay | Admitting: Family Medicine

## 2017-03-27 ENCOUNTER — Other Ambulatory Visit: Payer: Self-pay | Admitting: General Practice

## 2017-03-27 ENCOUNTER — Telehealth: Payer: Self-pay | Admitting: Family Medicine

## 2017-03-27 MED ORDER — BUPROPION HCL ER (XL) 300 MG PO TB24: 300.0000 mg | ORAL_TABLET | Freq: Every day | ORAL | 0 refills | Status: DC

## 2017-03-27 NOTE — Telephone Encounter (Signed)
Copied from CRM [id number]. Topic: Quick Communication - See Telephone Encounter >> Mar 27, 2017  1:14 PM Landry MellowFoltz, Melissa J wrote: CRM for notification. See Telephone encounter for:   03/27/17. Pt needs to have pre - auth for buPROPion (WELLBUTRIN XL) 300 MG 24 hr tablet -  [employer] - [phone] id# [id number] Pt has 1 week left  [facility]  [address]  Please let pt know if there will be a delay

## 2017-03-27 NOTE — Telephone Encounter (Signed)
Resent prescription to see if PA is generated. We have not received anything in our office.

## 2017-04-15 ENCOUNTER — Other Ambulatory Visit: Payer: Self-pay | Admitting: Family Medicine

## 2017-04-21 ENCOUNTER — Other Ambulatory Visit: Payer: Self-pay | Admitting: Family Medicine

## 2017-04-22 ENCOUNTER — Encounter: Payer: Self-pay | Admitting: Family Medicine

## 2017-04-22 NOTE — Telephone Encounter (Signed)
Pt is calling to check on status of refill  She states she will be out of medication after today   [id number]- [place], [place] - [address] [phone] (Phone) [phone] (Fax)

## 2017-04-22 NOTE — Telephone Encounter (Signed)
Xanax refill request - controlled substance  LOV 12/03/16 by Dr. Beverely Lowabori  [facility] [address] [facility] [place] [place]   [address]

## 2017-04-22 NOTE — Telephone Encounter (Signed)
Copied from CRM [phone]. Topic: Quick Communication - Rx Refill/Question >> Apr 22, 2017  9:08 AM Lelon FrohlichGolden, Parveen Freehling, RMA wrote: Medication: xanax 0.5 mg   Has the patient contacted their pharmacy? yes   (Agent: If no, request that the patient contact the pharmacy for the refill.)   Preferred Pharmacy (with phone number [facility] street name): [facility] [address]   Agent: Please be advised that RX refills may take up to 3 business days. We ask that you follow-up with your pharmacy.

## 2017-04-22 NOTE — Telephone Encounter (Signed)
Last OV 12/03/16 Alprazolam last filled 03/18/17 #60 with 0

## 2017-04-22 NOTE — Telephone Encounter (Signed)
Last Filled: 03/18/17, 60 with 0

## 2017-04-23 NOTE — Telephone Encounter (Signed)
Patient sent a mychart message about rx. I have responded to her letting her know that this has been filled.

## 2017-05-06 DIAGNOSIS — R69 Illness, unspecified: Secondary | ICD-10-CM | POA: Diagnosis not present

## 2017-05-07 DIAGNOSIS — R69 Illness, unspecified: Secondary | ICD-10-CM | POA: Diagnosis not present

## 2017-06-01 DIAGNOSIS — K581 Irritable bowel syndrome with constipation: Secondary | ICD-10-CM | POA: Diagnosis not present

## 2017-06-01 DIAGNOSIS — K862 Cyst of pancreas: Secondary | ICD-10-CM | POA: Diagnosis not present

## 2017-06-01 DIAGNOSIS — Q446 Cystic disease of liver: Secondary | ICD-10-CM | POA: Diagnosis not present

## 2017-06-01 DIAGNOSIS — R1013 Epigastric pain: Secondary | ICD-10-CM | POA: Diagnosis not present

## 2017-06-01 DIAGNOSIS — R748 Abnormal levels of other serum enzymes: Secondary | ICD-10-CM | POA: Diagnosis not present

## 2017-06-01 DIAGNOSIS — Q613 Polycystic kidney, unspecified: Secondary | ICD-10-CM | POA: Diagnosis not present

## 2017-06-01 DIAGNOSIS — R110 Nausea: Secondary | ICD-10-CM | POA: Diagnosis not present

## 2017-06-01 DIAGNOSIS — R1011 Right upper quadrant pain: Secondary | ICD-10-CM | POA: Diagnosis not present

## 2017-06-01 DIAGNOSIS — D509 Iron deficiency anemia, unspecified: Secondary | ICD-10-CM | POA: Diagnosis not present

## 2017-06-10 DIAGNOSIS — H524 Presbyopia: Secondary | ICD-10-CM | POA: Diagnosis not present

## 2017-06-19 ENCOUNTER — Other Ambulatory Visit: Payer: Self-pay | Admitting: Family Medicine

## 2017-06-19 ENCOUNTER — Ambulatory Visit (INDEPENDENT_AMBULATORY_CARE_PROVIDER_SITE_OTHER): Payer: Medicare HMO | Admitting: Family Medicine

## 2017-06-19 ENCOUNTER — Other Ambulatory Visit: Payer: Self-pay

## 2017-06-19 ENCOUNTER — Encounter: Payer: Self-pay | Admitting: Family Medicine

## 2017-06-19 ENCOUNTER — Other Ambulatory Visit: Payer: Self-pay | Admitting: General Practice

## 2017-06-19 VITALS — BP 129/81 | HR 78 | Temp 98.0°F | Resp 16 | Ht 61.0 in | Wt 116.1 lb

## 2017-06-19 DIAGNOSIS — F331 Major depressive disorder, recurrent, moderate: Secondary | ICD-10-CM | POA: Diagnosis not present

## 2017-06-19 DIAGNOSIS — R319 Hematuria, unspecified: Secondary | ICD-10-CM

## 2017-06-19 DIAGNOSIS — R300 Dysuria: Secondary | ICD-10-CM | POA: Diagnosis not present

## 2017-06-19 DIAGNOSIS — M2550 Pain in unspecified joint: Secondary | ICD-10-CM | POA: Diagnosis not present

## 2017-06-19 DIAGNOSIS — R69 Illness, unspecified: Secondary | ICD-10-CM | POA: Diagnosis not present

## 2017-06-19 DIAGNOSIS — L853 Xerosis cutis: Secondary | ICD-10-CM

## 2017-06-19 LAB — POCT URINALYSIS DIPSTICK
Bilirubin, UA: NEGATIVE
Glucose, UA: NEGATIVE
Ketones, UA: NEGATIVE
Leukocytes, UA: NEGATIVE
Nitrite, UA: NEGATIVE
Protein, UA: NEGATIVE
Spec Grav, UA: 1.015 (ref 1.010–1.025)
Urobilinogen, UA: 0.2 E.U./dL
pH, UA: 6.5 (ref 5.0–8.0)

## 2017-06-19 LAB — CBC WITH DIFFERENTIAL/PLATELET
Basophils Absolute: 0.1 10*3/uL (ref 0.0–0.1)
Basophils Relative: 1.3 % (ref 0.0–3.0)
Eosinophils Absolute: 0.1 10*3/uL (ref 0.0–0.7)
Eosinophils Relative: 3.3 % (ref 0.0–5.0)
HCT: 39.3 % (ref 36.0–46.0)
Hemoglobin: 13.4 g/dL (ref 12.0–15.0)
Lymphocytes Relative: 35.1 % (ref 12.0–46.0)
Lymphs Abs: 1.5 10*3/uL (ref 0.7–4.0)
MCHC: 34.2 g/dL (ref 30.0–36.0)
MCV: 94.9 fl (ref 78.0–100.0)
Monocytes Absolute: 0.4 10*3/uL (ref 0.1–1.0)
Monocytes Relative: 9.4 % (ref 3.0–12.0)
Neutro Abs: 2.1 10*3/uL (ref 1.4–7.7)
Neutrophils Relative %: 50.9 % (ref 43.0–77.0)
Platelets: 231 10*3/uL (ref 150.0–400.0)
RBC: 4.14 Mil/uL (ref 3.87–5.11)
RDW: 13.6 % (ref 11.5–15.5)
WBC: 4.2 10*3/uL (ref 4.0–10.5)

## 2017-06-19 LAB — SEDIMENTATION RATE: Sed Rate: 1 mm/hr (ref 0–30)

## 2017-06-19 MED ORDER — TRIAMCINOLONE ACETONIDE 0.1 % EX OINT: 1.0000 "application " | TOPICAL_OINTMENT | Freq: Two times a day (BID) | CUTANEOUS | 1 refills | Status: DC

## 2017-06-19 MED ORDER — CEPHALEXIN 500 MG PO CAPS: 500.0000 mg | ORAL_CAPSULE | Freq: Two times a day (BID) | ORAL | 0 refills | Status: AC

## 2017-06-19 NOTE — Addendum Note (Signed)
Addended by: Marcille Blanco on: 06/19/2017 11:45 AM   Modules accepted: Orders

## 2017-06-19 NOTE — Assessment & Plan Note (Signed)
Pt has lost her husband and her daughter within 6 months.  She was able to wean off her Wellbutrin and is feeling well.  Remains on Celexa.  Will continue to follow as the grief may hit her at a later date.

## 2017-06-19 NOTE — Patient Instructions (Signed)
Follow up as needed or as scheduled We'll notify you of your lab results and make any changes if needed Use the Triamcinolone ointment on your shoulder for the itching START OTC Tylenol Arthritis 1 tab 3x/day to improve joint pains Call with any questions or concerns Hang in there!!!

## 2017-06-19 NOTE — Progress Notes (Signed)
   Subjective:    Patient ID: Regina Gonzalez, female    DOB: Dec 17, 1945, 72 y.o.   MRN: [id number]  HPI Joint pain- pt reports 'a lot of pain in my wrists and ankles'.  Pain is intermittent.  Will periodically take aspirin or tylenol.  Pain in R is worse than L but it is bilateral.  Pain is described as an 'ache'.  No known hx of RA.  Joints do not get red, warm, or swollen.  Dry skin- no relief w/ lotion.  Has 1 particular spot on R shoulder.  Depression- pt has weaned off Wellbutrin.  Remains on the Celexa.  Daughter passed away last year, husband just died last month.  Dysuria- sxs started 'a couple of weeks ago'.  Has appt upcoming w/ [facility] due to bladder prolapse.  Increased frequency.   Review of Systems For ROS see HPI     Objective:   Physical Exam  Constitutional: She is oriented to person, place, and time. She appears well-developed and well-nourished. No distress.  HENT:  Head: Normocephalic and atraumatic.  Cardiovascular: Intact distal pulses.  Abdominal: Soft. Bowel sounds are normal. She exhibits no distension and no mass. There is no tenderness. There is no guarding.  Musculoskeletal: She exhibits no edema.  Nodular deformity of PIP and DIP joints bilaterally No redness, warmth, swelling, or deformity of wrists bilaterally or ankles bilaterally.  Full ROM  Neurological: She is alert and oriented to person, place, and time.  Skin: Skin is warm and dry. No rash noted. No erythema.  Psychiatric: She has a normal mood and affect. Her behavior is normal. Thought content normal.  Vitals reviewed.         Assessment & Plan:  Polyarthralgia- new.  Pt has known arthritis of hands bilaterally but is now having episodic pain of of wrist and ankles bilaterally.  No redness, swelling, warmth, or deformity.  Check ANA, RF, ESR.  Encouraged her to start Tylenol Arthritis.  Will avoid NSAIDs due to GI issues.  Pt expressed understanding and is in agreement w/ plan.   Dry  Skin- no obvious rash or breakout of R shoulder.  Start Triamcinolone cream for the itching.  Pt expressed understanding and is in agreement w/ plan.   Dysuria- new.  Pt has had 2 weeks of sxs.  + frequency.  UA shows 1+ blood.  Start Keflex and send for cx.

## 2017-06-21 LAB — URINE CULTURE
MICRO NUMBER:: 90606042
SPECIMEN QUALITY:: ADEQUATE

## 2017-06-22 ENCOUNTER — Ambulatory Visit: Payer: Self-pay

## 2017-06-22 NOTE — Telephone Encounter (Signed)
Pt given lab results per notes of Dr Beverely Low on 06/22/17. Pt verbalized understanding.

## 2017-06-23 ENCOUNTER — Other Ambulatory Visit: Payer: Self-pay | Admitting: Family Medicine

## 2017-06-23 DIAGNOSIS — R768 Other specified abnormal immunological findings in serum: Secondary | ICD-10-CM

## 2017-06-23 LAB — ANTI-NUCLEAR AB-TITER (ANA TITER): ANA Titer 1: 1:80 {titer} — ABNORMAL HIGH

## 2017-06-23 LAB — ANA: Anti Nuclear Antibody(ANA): POSITIVE — AB

## 2017-06-23 LAB — RHEUMATOID FACTOR: Rhuematoid fact SerPl-aCnc: <14 IU/mL (ref <14)

## 2017-07-05 DIAGNOSIS — M109 Gout, unspecified: Secondary | ICD-10-CM | POA: Diagnosis not present

## 2017-07-06 ENCOUNTER — Telehealth: Payer: Self-pay | Admitting: General Practice

## 2017-07-06 NOTE — Telephone Encounter (Signed)
Noted, called pt and left a detailed message to return phone call to schedule office visit with PCP.   [facility] / Schedule patient.

## 2017-07-06 NOTE — Telephone Encounter (Signed)
Please advise?   Copied from CRM [id number]. Topic: Inquiry >> Jul 06, 2017 10:10 AM Windy KalataMichael, Taylor L, NT wrote: Reason for CRM: patient states she seen at urgent care on 07/05/17 for gout. She states they prescribed her colchicine. She would like to know if Dr. Beverely Lowabori would like to see her for this.

## 2017-07-06 NOTE — Telephone Encounter (Signed)
Since this is a new dx for her, I would like to see her sometime this week or next

## 2017-07-07 ENCOUNTER — Other Ambulatory Visit: Payer: Self-pay

## 2017-07-07 ENCOUNTER — Encounter: Payer: Self-pay | Admitting: Family Medicine

## 2017-07-07 ENCOUNTER — Ambulatory Visit (INDEPENDENT_AMBULATORY_CARE_PROVIDER_SITE_OTHER): Payer: Medicare HMO | Admitting: Family Medicine

## 2017-07-07 DIAGNOSIS — M109 Gout, unspecified: Secondary | ICD-10-CM | POA: Diagnosis not present

## 2017-07-07 LAB — URIC ACID: Uric Acid, Serum: 2.9 mg/dL (ref 2.4–7.0)

## 2017-07-07 NOTE — Patient Instructions (Signed)
Follow up as needed or as scheduled We'll notify you of your lab results and make any changes if needed Continue to drink plenty of fluids, cherries can also help Avoid high levels of shrimp, beef, beer, or other purine rich foods Call with any questions or concerns Hang in there!!!

## 2017-07-07 NOTE — Assessment & Plan Note (Signed)
New to provider.  [facility] and dx[facility] w/ [facility].  Sxs have completely resolved since starting colchicine.  Will check Uric Acid level to determine if Allopurinol is needed.  Pt provided w/ a gout diet.  Will follow closely.

## 2017-07-07 NOTE — Progress Notes (Signed)
   Subjective:    Patient ID: Regina Gonzalez, female    DOB: 27-Mar-1945, 72 y.o.   MRN: [id number]  HPI Gout- pt was seen Sunday AM at [facility].  Pt had swelling and redness of R 1st MTP joint.  Was given colchicine and prednisone (did not take) that dramatically improved her sxs.  Pt denies changes to diet w/ exception of adding Kombucha.     Review of Systems For ROS see HPI     Objective:   Physical Exam  Constitutional: She is oriented to person, place, and time. She appears well-developed and well-nourished. No distress.  HENT:  Head: Normocephalic and atraumatic.  Cardiovascular: Intact distal pulses.  Musculoskeletal: She exhibits no edema (no swelling, redness, or TTP over R 1st MTP joint) or tenderness.  Neurological: She is alert and oriented to person, place, and time.  Skin: Skin is warm and dry. No erythema.  Psychiatric: She has a normal mood and affect. Her behavior is normal. Thought content normal.  Vitals reviewed.         Assessment & Plan:

## 2017-07-08 ENCOUNTER — Encounter: Payer: Self-pay | Admitting: General Practice

## 2017-07-09 ENCOUNTER — Ambulatory Visit: Payer: Medicare HMO | Admitting: Family Medicine

## 2017-07-13 ENCOUNTER — Ambulatory Visit (INDEPENDENT_AMBULATORY_CARE_PROVIDER_SITE_OTHER): Payer: Self-pay

## 2017-07-13 ENCOUNTER — Encounter: Payer: Self-pay | Admitting: Rheumatology

## 2017-07-13 ENCOUNTER — Ambulatory Visit: Payer: Medicare HMO | Admitting: Rheumatology

## 2017-07-13 VITALS — BP 128/58 | HR 59 | Ht 61.5 in | Wt 116.0 lb

## 2017-07-13 DIAGNOSIS — G459 Transient cerebral ischemic attack, unspecified: Secondary | ICD-10-CM

## 2017-07-13 DIAGNOSIS — M79642 Pain in left hand: Secondary | ICD-10-CM | POA: Diagnosis not present

## 2017-07-13 DIAGNOSIS — M79641 Pain in right hand: Secondary | ICD-10-CM | POA: Diagnosis not present

## 2017-07-13 DIAGNOSIS — M25561 Pain in right knee: Secondary | ICD-10-CM | POA: Diagnosis not present

## 2017-07-13 DIAGNOSIS — M7711 Lateral epicondylitis, right elbow: Secondary | ICD-10-CM

## 2017-07-13 DIAGNOSIS — M545 Low back pain, unspecified: Secondary | ICD-10-CM

## 2017-07-13 DIAGNOSIS — M79672 Pain in left foot: Secondary | ICD-10-CM

## 2017-07-13 DIAGNOSIS — F419 Anxiety disorder, unspecified: Secondary | ICD-10-CM

## 2017-07-13 DIAGNOSIS — M25562 Pain in left knee: Secondary | ICD-10-CM | POA: Diagnosis not present

## 2017-07-13 DIAGNOSIS — F32A Depression, unspecified: Secondary | ICD-10-CM

## 2017-07-13 DIAGNOSIS — M79671 Pain in right foot: Secondary | ICD-10-CM | POA: Diagnosis not present

## 2017-07-13 DIAGNOSIS — Z8719 Personal history of other diseases of the digestive system: Secondary | ICD-10-CM | POA: Diagnosis not present

## 2017-07-13 DIAGNOSIS — M810 Age-related osteoporosis without current pathological fracture: Secondary | ICD-10-CM

## 2017-07-13 DIAGNOSIS — Z8639 Personal history of other endocrine, nutritional and metabolic disease: Secondary | ICD-10-CM | POA: Diagnosis not present

## 2017-07-13 DIAGNOSIS — F329 Major depressive disorder, single episode, unspecified: Secondary | ICD-10-CM

## 2017-07-13 DIAGNOSIS — G8929 Other chronic pain: Secondary | ICD-10-CM

## 2017-07-13 DIAGNOSIS — R768 Other specified abnormal immunological findings in serum: Secondary | ICD-10-CM | POA: Diagnosis not present

## 2017-07-13 NOTE — Patient Instructions (Signed)

## 2017-07-13 NOTE — Progress Notes (Addendum)
Office Visit Note  Patient: Regina Gonzalez             Date of Birth: 04-25-1945           MRN: [id number]             PCP: Sheliah Hatch, MD Referring: Sheliah Hatch, MD Visit Date: 07/13/2017 Occupation: @GUAROCC @    Subjective:  Pain in multiple joints   History of Present Illness: Regina Gonzalez is a 72 y.o. female in consultation per request of her PCP.  According to patient her symptoms started about 20 years ago when she noticed twisting in her fingers but they were not painful.  She states 10 years ago she started having increased joint pain in her hands and she started seeing Dr. Kellie Simmering.  She states he would give her cortisone injections to the joints of her hands which will help.  Gradually the symptoms got worse in the last year she has been experiencing increased pain in her hands.  She also complains of pain in her bilateral knee joints, calves, ankles and her feet.  She has lower back pain off and on.  She gives history of fatigue and insomnia.  She takes Xanax every night for insomnia.  She has noticed some visual changes she was seen by ophthalmologist who told her that she has cataracts but she is not ready for surgery yet.  She is also noticed is some numbness in her vaginal area.  She has history of eczema for many years.  She was also diagnosed with osteoporosis for which she is taking calcium and vitamin D only.  She states last week she developed pain and swelling in her right first MTP joint which was felt to be gout.  She took 2 doses of colchicine and the symptoms improved.  She had labs done by her PCP at the time.  She was given a prednisone taper but she did not take it.  Activities of Daily Living:  Patient reports morning stiffness for 8 hours.   Patient Denies nocturnal pain.  Difficulty dressing/grooming: Denies Difficulty climbing stairs: Reports Difficulty getting out of chair: Denies Difficulty using hands for taps, buttons, cutlery, and/or  writing: Reports   Review of Systems  Constitutional: Positive for fatigue. Negative for fever, night sweats, weight gain and weight loss.  HENT: Negative for ear pain, mouth sores, trouble swallowing, trouble swallowing, mouth dryness and nose dryness.   Eyes: Negative for pain, redness, visual disturbance and dryness.  Respiratory: Negative for cough, shortness of breath and difficulty breathing.   Cardiovascular: Negative for chest pain, palpitations, hypertension, irregular heartbeat and swelling in legs/feet.  Gastrointestinal: Positive for constipation. Negative for blood in stool and diarrhea.  Endocrine: Negative for increased urination.  Genitourinary: Negative for difficulty urinating and vaginal dryness.  Musculoskeletal: Positive for arthralgias, joint pain, joint swelling, myalgias, muscle weakness, morning stiffness and myalgias. Negative for muscle tenderness.  Skin: Negative for color change, rash, hair loss, skin tightness, ulcers and sensitivity to sunlight.  Allergic/Immunologic: Negative for susceptible to infections.  Neurological: Negative for dizziness, numbness, memory loss, night sweats and weakness.  Hematological: Negative for bruising/bleeding tendency and swollen glands.  Psychiatric/Behavioral: Negative for depressed mood and sleep disturbance. The patient is not nervous/anxious.     PMFS History:  Patient Active Problem List   Diagnosis Date Noted  . Gout 07/07/2017  . Moderate episode of recurrent major depressive disorder (HCC) 06/19/2017  . Acute bronchitis 05/22/2015  . Female  bladder prolapse 03/15/2015  . Occipital neuralgia of left side 12/08/2014  . Trapezius muscle spasm 12/04/2014  . Lateral epicondylitis of right elbow 12/04/2014  . History of tobacco abuse 09/15/2014  . Memory loss 05/11/2014  . Right-sided thoracic back pain 01/09/2014  . Acute bacterial sinusitis 12/20/2013  . Postmenopausal bleeding 07/08/2013  . Hoarseness 07/08/2013    . External hemorrhoid, bleeding 04/29/2013  . Osteoporosis 07/05/2012  . TIA (transient ischemic attack) 07/05/2012  . Routine general medical examination at a health care facility 07/05/2012  . UTI (lower urinary tract infection) 06/19/2011  . Frequency of urination 10/31/2010  . Nasal folliculitis 10/14/2010  . Hypopigmentation 09/01/2010  . Screening for malignant neoplasm of the cervix 06/04/2010  . GERD 04/15/2010  . IRRITABLE BOWEL SYNDROME 04/15/2010  . FLATULENCE ERUCTATION AND GAS PAIN 04/15/2010  . SINUSITIS - ACUTE-NOS 02/27/2010  . SHORTNESS OF BREATH 02/20/2010  . HELICOBACTER PYLORI GASTRITIS, HX OF 01/10/2010  . Hyperlipidemia 12/06/2009  . ANEMIA-NOS 12/06/2009  . Depression 12/06/2009  . ALLERGIC RHINITIS 12/06/2009  . BARRETTS ESOPHAGUS 12/06/2009    Past Medical History:  Diagnosis Date  . Allergic rhinitis   . Anxiety   . Arthritis    HANDS  . Benign liver cyst    multiple  . Chronic constipation   . Depression   . Diverticulosis of colon   . Emphysematous COPD (HCC)   . External hemorrhoid   . GERD (gastroesophageal reflux disease)   . History of colon polyps   . History of Helicobacter pylori infection   . History of TIA (transient ischemic attack)    apr 2013-- no residual  . Hyperlipidemia   . Occipital neuralgia of left side 12/08/2014  . Osteoporosis   . Pancreatic cyst    simple benign  . PONV (postoperative nausea and vomiting)   . Prolapsed internal hemorrhoids, grade 3   . Simple renal cyst    right   . Wears glasses   . Wears partial dentures    UPPER    Family History  Problem Relation Age of Onset  . Alcoholism Father   . Diabetes type II Brother   . Heart attack Brother   . Multiple sclerosis Daughter   . Pancreatitis Daughter   . Colon cancer Neg Hx    Past Surgical History:  Procedure Laterality Date  . APPENDECTOMY  1960  . COLONOSCOPY WITH PROPOFOL  last one 10-25-2013   polypectomy  . D & C HYSTEROSCOPY W/  ENDOMETRIAL POLYPECTOMY    . ESOPHAGOGASTRODUODENOSCOPY  last one 03-04-2013  . EXCISION BILATERAL BREAST CYST  1995  . HEMORRHOID SURGERY N/A 04/06/2014   Procedure: Hemmorhoidopexy with anal canal biopsy;  Surgeon: Romie Levee, MD;  Location: [facility];  Service: General;  Laterality: N/A;  . LAPAROSCOPIC RIGHT OVARIAN CYSTECTOMY/  D & C HYSTEROSCOPY ENDOMETRIAL POLYPECTOMY  2011  . TONSILLECTOMY AND ADENOIDECTOMY  1955  . TRANSTHORACIC ECHOCARDIOGRAM  08-13-2012   normal LV/  ef 60-65%/  mild AR/  trivial MR   Social History   Social History Narrative   Patient lives at home wit her husband  Channing Mutters). works for [employer]. Patient has high school education and two children. Caffeine 2-3 cups daily.     Objective: Vital Signs: BP (!) 128/58 (BP Location: Left Arm, Patient Position: Sitting, Cuff Size: Normal)   Pulse (!) 59   Ht 5' 1.5" (1.562 m)   Wt 116 lb (52.6 kg)   LMP 06/16/1997  BMI 21.56 kg/m    Physical Exam  Constitutional: She is oriented to person, place, and time. She appears well-developed and well-nourished.  HENT:  Head: Normocephalic and atraumatic.  Eyes: Conjunctivae and EOM are normal.  Neck: Normal range of motion.  Cardiovascular: Normal rate, regular rhythm, normal heart sounds and intact distal pulses.  Pulmonary/Chest: Effort normal and breath sounds normal.  Abdominal: Soft. Bowel sounds are normal.  Lymphadenopathy:    She has no cervical adenopathy.  Neurological: She is alert and oriented to person, place, and time.  Skin: Skin is warm and dry. Capillary refill takes less than 2 seconds.  Psychiatric: She has a normal mood and affect. Her behavior is normal.  Nursing note and vitals reviewed.    Musculoskeletal Exam: C-spine thoracic lumbar spine good range of motion.  She has some discomfort range of motion of her lumbar spine.  Shoulder joints elbow joints wrist joints are good range of motion.  She has DIP  PIP thickening with DIP subluxation in her hands.  Hip joints knee joints ankles MTPs PIPs with good range of motion.  She has bilateral first MTP thickening.  She appears to have some swelling in her left knee joint.  All other joints are in good range of motion.  She had tenderness over right lateral epicondyle.  CDAI Exam: No CDAI exam completed.    Investigation: Findings:  06/19/17: Uric acid 2.9, ANA 1:80 speckled, RF -, Sed rate 1    Component     Latest Ref Rng & Units 06/19/2017  Anti Nuclear Antibody(ANA)     NEGATIVE POSITIVE (A)  RA Latex Turbid.     <14 IU/mL <14  Sed Rate     0 - 30 mm/hr 1   CBC Latest Ref Rng & Units 06/19/2017 09/25/2016 08/07/2016  WBC 4.0 - 10.5 K/uL 4.2 4.2 3.9(L)  Hemoglobin 12.0 - 15.0 g/dL 46.9 62.9 52.8  Hematocrit 36.0 - 46.0 % 39.3 40.0 42.3  Platelets 150.0 - 400.0 K/uL 231.0 240.0 211.0   CMP Latest Ref Rng & Units 09/25/2016 08/07/2016 04/02/2016  Glucose 70 - 99 mg/dL 84 413(K) -  BUN 6 - 23 mg/dL 12 11 -  Creatinine 4.40 - 1.20 mg/dL 1.02 7.25 -  Sodium 366 - 145 mEq/L 141 139 -  Potassium 3.5 - 5.1 mEq/L 4.2 3.9 -  Chloride 96 - 112 mEq/L 103 101 -  CO2 19 - 32 mEq/L 33(H) 33(H) -  Calcium 8.4 - 10.5 mg/dL 9.8 9.9 -  Total Protein 6.0 - 8.3 g/dL 6.3 6.6 6.6  Total Bilirubin 0.2 - 1.2 mg/dL 0.4 0.5 0.4  Alkaline Phos 39 - 117 U/L 63 62 61  AST 0 - 37 U/L 21 19 20   ALT 0 - 35 U/L 19 17 14      Imaging: Xr Foot 2 Views Left  Result Date: 07/13/2017 First MTP PIP and DIP narrowing was noted.  No erosive changes were noted.  No intertarsal joint space narrowing was noted.  Small calcaneal spur was noted. Impression: These findings are consistent with osteoarthritis of the foot.  Xr Foot 2 Views Right  Result Date: 07/13/2017 Severe first MTP narrowing was noted.  PIP and DIP narrowing was noted.  None of the other MTPs were involved.  No erosive changes were noted.  No intertarsal joint space narrowing was noted. Impression: These  findings are consistent with osteoarthritis of the foot.  Xr Hand 2 View Left  Result Date: 07/13/2017 CMC narrowing was noted.  All PIP and DIP narrowing was noted.  Some erosive changes were noted in the second DIP joint.  No MCP intercarpal radiocarpal joint space narrowing was noted. Impression: These findings are consistent with osteoarthritis of the hand.  Xr Hand 2 View Right  Result Date: 07/13/2017 CMC narrowing was noted.  All PIP and DIP narrowing was noted.  Subluxation of first and second DIP joint with severe narrowing was noted.  Second and third MCP joint narrowing was noted.  No intercarpal radiocarpal joint space narrowing was noted.  No erosive changes were noted. Impression: These findings are consistent with osteoarthritis of the hand.  Narrowing of MCP joint raises the concern of possible inflammatory arthritis.  Xr Knee 3 View Left  Result Date: 07/13/2017 Moderate medial compartment narrowing with no chondrocalcinosis was noted.  Moderate patellofemoral narrowing was noted. Impression: These findings are consistent with moderate osteoarthritis and moderate chondromalacia patella.  Xr Knee 3 View Right  Result Date: 07/13/2017 Moderate medial compartment narrowing with no chondrocalcinosis was noted.  Moderate patellofemoral narrowing was noted. Impression: These findings are consistent with moderate osteoarthritis and moderate chondromalacia patella.  Xr Lumbar Spine 2-3 Views  Result Date: 07/13/2017 No significant disc space narrowing was noted.  Facet joint arthropathy was noted.  Levoscoliosis was noted.  No SI joint changes were noted.   Speciality Comments: No specialty comments available.    Procedures:  No procedures performed Allergies: Erythromycin and Flagyl [metronidazole]   Assessment / Plan:     Visit Diagnoses: Pain in both hands -clinical findings are consistent with severe osteoarthritis with DIP subluxation.  Joint protection muscle  strengthening was discussed.  Plan: XR Hand 2 View Right, XR Hand 2 View.  The x-rays showed severe osteoarthritis in bilateral hands.  She also had first and second MCP joint narrowing in the right hand.  Left handout on hand exercises was given.  Chronic pain of both knees -she complains of pain in her bilateral knee joints.  She appears to have some warmth in her left knee joint.  Plan: XR KNEE 3 VIEW RIGHT, XR KNEE 3 VIEW LEFT.  The x-ray showed bilateral moderate osteoarthritis and moderate chondromalacia patella.  Handout on knee exercises was given.  Pain in both feet -she has been having increased pain in her bilateral feet.  She has bilateral first MTP thickening consistent with osteoarthritis.  She gives history of recent right first MTP swelling which responded to colchicine.  Her uric acid was normal.  I will repeat her uric acid today.  As uric acid could be normal at the time of a flare.  Plan: XR Foot 2 Views Right, XR Foot 2 Views Left.  The x-ray showed osteoarthritis in bilateral feet especially the right first MTP severe narrowing.  Possible gout-patient had an episode with increased pain and swelling in her first MTP.  She brought pictures on her iPhone swollen red first MTP and the surrounding area of the foot.  Uric acid was normal.  We will repeat her uric acid today.  Chronic midline low back pain without sciatica - Plan: XR Lumbar Spine 2-3 Views.  The lumbar spine x-ray showed to scoliosis and facet joint arthropathy.  Positive ANA (antinuclear antibody) - Previous Dr. Kellie Simmering pt 06/19/17: Uric acid 2.9, ANA 1:80 speckled, RF -, Sed rate 1   Age-related osteoporosis without current pathological fracture - August 13, 2016AP Spine L1-L4 09/26/2014 [age] Osteoporosis -2.5 0.879 g/cm2  Lateral epicondylitis of right elbow doing better.  Other medical problems  are listed as following.  TIA (transient ischemic attack)  History of diverticulosis  History of Barrett's  esophagus  Anxiety and depression  History of hyperlipidemia    Orders: Orders Placed This Encounter  Procedures  . XR Hand 2 View Right  . XR Hand 2 View Left  . XR KNEE 3 VIEW RIGHT  . XR KNEE 3 VIEW LEFT  . XR Foot 2 Views Right  . XR Foot 2 Views Left  . XR Lumbar Spine 2-3 Views  . Uric acid  . Anti-scleroderma antibody  . RNP Antibody  . Anti-Smith antibody  . Sjogrens syndrome-A extractable nuclear antibody  . Sjogrens syndrome-B extractable nuclear antibody  . Anti-DNA antibody, double-stranded  . C3 and C4  . Beta-2 glycoprotein antibodies  . Cardiolipin antibodies, IgG, IgM, IgA  . Lupus Anticoagulant Eval w/Reflex  . Cyclic citrul peptide antibody, IgG  . Iron, TIBC and Ferritin Panel   No orders of the defined types were placed in this encounter.   Face-to-face time spent with patient was 50 minutes. >50% of time was spent in counseling and coordination of care.  Follow-Up Instructions: No follow-ups on file.   Pollyann SavoyShaili Lucielle Vokes, MD  Note - This record has been created using [employer] software.  Chart creation errors have been sought, but may not always  have been located. Such creation errors do not reflect on  the standard of medical care.

## 2017-07-15 LAB — BETA-2 GLYCOPROTEIN ANTIBODIES
Beta-2 Glyco 1 IgA: <9 SAU (ref <=20)
Beta-2 Glyco 1 IgM: <9 SMU (ref <=20)
Beta-2 Glyco I IgG: <9 SGU (ref <=20)

## 2017-07-15 LAB — CARDIOLIPIN ANTIBODIES, IGG, IGM, IGA
Anticardiolipin IgA: <11 [APL'U]
Anticardiolipin IgG: <14 [GPL'U]
Anticardiolipin IgM: <12 [MPL'U]

## 2017-07-15 LAB — URIC ACID: Uric Acid, Serum: 2.6 mg/dL (ref 2.5–7.0)

## 2017-07-15 LAB — RNP ANTIBODY: Ribonucleic Protein(ENA) Antibody, IgG: 2 AI — AB

## 2017-07-15 LAB — SJOGRENS SYNDROME-B EXTRACTABLE NUCLEAR ANTIBODY: SSB (La) (ENA) Antibody, IgG: <1 AI

## 2017-07-15 LAB — IRON,TIBC AND FERRITIN PANEL
%SAT: 22 % (calc) (ref 11–50)
Ferritin: 15 ng/mL — ABNORMAL LOW (ref 20–288)
Iron: 80 ug/dL (ref 45–160)
TIBC: 372 mcg/dL (calc) (ref 250–450)

## 2017-07-15 LAB — ANTI-DNA ANTIBODY, DOUBLE-STRANDED: ds DNA Ab: <1 IU/mL

## 2017-07-15 LAB — LUPUS ANTICOAGULANT EVAL W/ REFLEX
PTT-LA Screen: 31 s (ref <=40)
dRVVT: 36 s (ref <=45)

## 2017-07-15 LAB — ANTI-SMITH ANTIBODY: ENA SM Ab Ser-aCnc: <1 AI

## 2017-07-15 LAB — C3 AND C4
C3 Complement: 108 mg/dL (ref 83–193)
C4 Complement: 22 mg/dL (ref 15–57)

## 2017-07-15 LAB — ANTI-SCLERODERMA ANTIBODY: Scleroderma (Scl-70) (ENA) Antibody, IgG: <1 AI

## 2017-07-15 LAB — CYCLIC CITRUL PEPTIDE ANTIBODY, IGG: Cyclic Citrullin Peptide Ab: <16 UNITS

## 2017-07-15 LAB — SJOGRENS SYNDROME-A EXTRACTABLE NUCLEAR ANTIBODY: SSA (Ro) (ENA) Antibody, IgG: <1 AI

## 2017-07-15 NOTE — Progress Notes (Signed)
Discuss at followup visit.

## 2017-07-17 ENCOUNTER — Telehealth: Payer: Self-pay | Admitting: Rheumatology

## 2017-07-17 NOTE — Telephone Encounter (Signed)
Patient called requesting the results of her labwork.

## 2017-07-22 ENCOUNTER — Telehealth: Payer: Self-pay | Admitting: Rheumatology

## 2017-07-22 NOTE — Telephone Encounter (Signed)
Patient advised that all labs would be discussed at her new patient follow up which was rescheduled for 08/11/17.

## 2017-07-22 NOTE — Telephone Encounter (Signed)
Patient called stating she was returning your call.   °

## 2017-07-22 NOTE — Telephone Encounter (Signed)
Attempted to contact the patient and left message for patient to call the office.  

## 2017-07-23 DIAGNOSIS — N8111 Cystocele, midline: Secondary | ICD-10-CM | POA: Diagnosis not present

## 2017-07-23 DIAGNOSIS — R3121 Asymptomatic microscopic hematuria: Secondary | ICD-10-CM | POA: Diagnosis not present

## 2017-07-29 ENCOUNTER — Other Ambulatory Visit: Payer: Self-pay | Admitting: Family Medicine

## 2017-07-29 NOTE — Telephone Encounter (Signed)
Last OV 07/07/17 Alprazolam last filled 04/23/17 #[id number] with 3

## 2017-07-31 ENCOUNTER — Ambulatory Visit: Payer: Medicare Other | Admitting: Rheumatology

## 2017-08-03 NOTE — Progress Notes (Signed)
Office Visit Note  Patient: Regina Gonzalez             Date of Birth: 1945-10-30           MRN: [id number]             PCP: Sheliah Hatch, MD Referring: Sheliah Hatch, MD Visit Date: 08/11/2017 Occupation: @GUAROCC @    Subjective:  Pain in joints and fatigue.   History of Present Illness: Regina Gonzalez is a 72 y.o. female with history of osteoarthritis, scoliosis and osteoporosis.  She states she continues to have intermittent joint pain and fatigue.  She denies any joint swelling.  Activities of Daily Living:  Patient reports morning stiffness for 0 minutes.   Patient Denies nocturnal pain.  Difficulty dressing/grooming: Denies Difficulty climbing stairs: Denies Difficulty getting out of chair: Reports Difficulty using hands for taps, buttons, cutlery, and/or writing: Reports   Review of Systems  Constitutional: Positive for fatigue. Negative for night sweats, weight gain and weight loss.  HENT: Negative for mouth sores, trouble swallowing, trouble swallowing, mouth dryness and nose dryness.   Eyes: Negative for pain, redness, itching, visual disturbance and dryness.  Respiratory: Negative for cough, shortness of breath and difficulty breathing.   Cardiovascular: Negative for chest pain, palpitations, hypertension, irregular heartbeat and swelling in legs/feet.  Gastrointestinal: Positive for constipation. Negative for abdominal pain, blood in stool and diarrhea.  Endocrine: Negative for increased urination.  Genitourinary: Negative for pelvic pain and vaginal dryness.  Musculoskeletal: Positive for arthralgias and joint pain. Negative for joint swelling, myalgias, muscle weakness, morning stiffness, muscle tenderness and myalgias.  Skin: Negative for color change, rash, hair loss, skin tightness, ulcers and sensitivity to sunlight.  Allergic/Immunologic: Negative for susceptible to infections.  Neurological: Negative for dizziness, light-headedness, memory  loss, night sweats and weakness.  Hematological: Negative for bruising/bleeding tendency and swollen glands.  Psychiatric/Behavioral: Negative for depressed mood, confusion and sleep disturbance. The patient is not nervous/anxious.     PMFS History:  Patient Active Problem List   Diagnosis Date Noted  . Gout 07/07/2017  . Moderate episode of recurrent major depressive disorder (HCC) 06/19/2017  . Acute bronchitis 05/22/2015  . Female bladder prolapse 03/15/2015  . Occipital neuralgia of left side 12/08/2014  . Trapezius muscle spasm 12/04/2014  . Lateral epicondylitis of right elbow 12/04/2014  . History of tobacco abuse 09/15/2014  . Memory loss 05/11/2014  . Right-sided thoracic back pain 01/09/2014  . Acute bacterial sinusitis 12/20/2013  . Postmenopausal bleeding 07/08/2013  . Hoarseness 07/08/2013  . External hemorrhoid, bleeding 04/29/2013  . Osteoporosis 07/05/2012  . TIA (transient ischemic attack) 07/05/2012  . Routine general medical examination at a health care facility 07/05/2012  . UTI (lower urinary tract infection) 06/19/2011  . Frequency of urination 10/31/2010  . Nasal folliculitis 10/14/2010  . Hypopigmentation 09/01/2010  . Screening for malignant neoplasm of the cervix 06/04/2010  . GERD 04/15/2010  . IRRITABLE BOWEL SYNDROME 04/15/2010  . FLATULENCE ERUCTATION AND GAS PAIN 04/15/2010  . SINUSITIS - ACUTE-NOS 02/27/2010  . SHORTNESS OF BREATH 02/20/2010  . HELICOBACTER PYLORI GASTRITIS, HX OF 01/10/2010  . Hyperlipidemia 12/06/2009  . ANEMIA-NOS 12/06/2009  . Depression 12/06/2009  . ALLERGIC RHINITIS 12/06/2009  . BARRETTS ESOPHAGUS 12/06/2009    Past Medical History:  Diagnosis Date  . Allergic rhinitis   . Anxiety   . Arthritis    HANDS  . Benign liver cyst    multiple  . Chronic constipation   . Depression   .  Diverticulosis of colon   . Emphysematous COPD (HCC)   . External hemorrhoid   . GERD (gastroesophageal reflux disease)   .  History of colon polyps   . History of Helicobacter pylori infection   . History of TIA (transient ischemic attack)    apr 2013-- no residual  . Hyperlipidemia   . Occipital neuralgia of left side 12/08/2014  . Osteoporosis   . Pancreatic cyst    simple benign  . PONV (postoperative nausea and vomiting)   . Prolapsed internal hemorrhoids, grade 3   . Simple renal cyst    right   . Wears glasses   . Wears partial dentures    UPPER    Family History  Problem Relation Age of Onset  . Alcoholism Father   . Diabetes type II Brother   . Heart attack Brother   . Cirrhosis Mother        non-alcoholic  . Multiple sclerosis Daughter   . Pancreatitis Daughter   . Colon cancer Neg Hx    Past Surgical History:  Procedure Laterality Date  . APPENDECTOMY  1960  . COLONOSCOPY WITH PROPOFOL  last one 10-25-2013   polypectomy  . D & C HYSTEROSCOPY W/ ENDOMETRIAL POLYPECTOMY    . ESOPHAGOGASTRODUODENOSCOPY  last one 03-04-2013  . EXCISION BILATERAL BREAST CYST  1995  . HEMORRHOID SURGERY N/A 04/06/2014   Procedure: Hemmorhoidopexy with anal canal biopsy;  Surgeon: Romie Levee, MD;  Location: [facility];  Service: General;  Laterality: N/A;  . LAPAROSCOPIC RIGHT OVARIAN CYSTECTOMY/  D & C HYSTEROSCOPY ENDOMETRIAL POLYPECTOMY  2011  . TONSILLECTOMY AND ADENOIDECTOMY  1955  . TRANSTHORACIC ECHOCARDIOGRAM  08-13-2012   normal LV/  ef 60-65%/  mild AR/  trivial MR   Social History   Social History Narrative   Patient lives at home wit her husband  Channing Mutters). works for [employer]. Patient has high school education and two children. Caffeine 2-3 cups daily.     Objective: Vital Signs: BP 136/60 (BP Location: Left Arm, Patient Position: Sitting, Cuff Size: Normal)   Pulse (!) 53   Resp 12   Ht 5' 1.42" (1.56 m)   Wt 117 lb (53.1 kg)   LMP 06/16/1997   BMI 21.81 kg/m    Physical Exam  Constitutional: She is oriented to person, place, and time. She  appears well-developed and well-nourished.  HENT:  Head: Normocephalic and atraumatic.  Eyes: Conjunctivae and EOM are normal.  Neck: Normal range of motion.  Cardiovascular: Normal rate, regular rhythm, normal heart sounds and intact distal pulses.  Pulmonary/Chest: Effort normal and breath sounds normal.  Abdominal: Soft. Bowel sounds are normal.  Lymphadenopathy:    She has no cervical adenopathy.  Neurological: She is alert and oriented to person, place, and time.  Skin: Skin is warm and dry. Capillary refill takes less than 2 seconds.  Psychiatric: She has a normal mood and affect. Her behavior is normal.  Nursing note and vitals reviewed.    Musculoskeletal Exam: C-spine thoracic spine good range of motion.  She has some lumbar scoliosis.  Shoulder joints elbow joints wrist joints MCPs were in good range of motion with no synovitis.  She has DIP thickening and subluxation consistent with osteoarthritis.  Hip joints knee joints ankles MTPs PIPs were in good range of motion with no synovitis.  CDAI Exam: No CDAI exam completed.    Investigation: Findings:  07/13/2017: Iron 80, TIBC 372, %saturation 22, ferritin 15,  beta-2 negative, anticardiolipin negative, C3 108, C4 22, uric acid 2.6, SCL 70-, RNP 2, Smith negative, La negative, Ro negative, double-stranded DNA negative, CCP negative    Component     Latest Ref Rng & Units 07/13/2017  Lupus Anticoagulant      see note  PTT-LA Screen     <=40 sec 31  DRVVT     <=45 sec 36  PT, Mixing Interp      Not Indicated  Iron     45 - 160 mcg/dL 80  TIBC     161 - 096 mcg/dL (calc) 045  %SAT     11 - 50 % (calc) 22  Ferritin     20 - 288 ng/mL 15 (L)  Beta-2 Glycoprotein I Ab, IgG     < OR = 20 SGU <9  Beta-2 Glyco 1 IgM     < OR = 20 SMU <9  Beta-2 Glyco 1 IgA     < OR = 20 SAU <9  Anticardiolipin Ab,IgA,Qn     APL <11  Anticardiolipin Ab,IgG,Qn     GPL <14  Anticardiolipin Ab,IgM,Qn     MPL <12  C3 Complement     83  - 193 mg/dL 409  C4 Complement     15 - 57 mg/dL 22  Uric Acid, Serum     2.5 - 7.0 mg/dL 2.6  Scleroderma (WJX-91) (ENA) Antibody, IgG     <1.0 NEG AI <1.0 NEG  Ribonucleic Protein(ENA) Antibody, IgG     <1.0 NEG AI 2.0 POS (A)  ENA SM Ab Ser-aCnc     <1.0 NEG AI <1.0 NEG  SSA (Ro) (ENA) Antibody, IgG     <1.0 NEG AI <1.0 NEG  SSB (La) (ENA) Antibody, IgG     <1.0 NEG AI <1.0 NEG  ds DNA Ab     IU/mL <1  Cyclic Citrullin Peptide Ab     UNITS <16   CBC Latest Ref Rng & Units 06/19/2017 09/25/2016 08/07/2016  WBC 4.0 - 10.5 K/uL 4.2 4.2 3.9(L)  Hemoglobin 12.0 - 15.0 g/dL 47.8 29.5 62.1  Hematocrit 36.0 - 46.0 % 39.3 40.0 42.3  Platelets 150.0 - 400.0 K/uL 231.0 240.0 211.0   CMP Latest Ref Rng & Units 09/25/2016 08/07/2016 04/02/2016  Glucose 70 - 99 mg/dL 84 308(M) -  BUN 6 - 23 mg/dL 12 11 -  Creatinine 5.78 - 1.20 mg/dL 4.69 6.29 -  Sodium 528 - 145 mEq/L 141 139 -  Potassium 3.5 - 5.1 mEq/L 4.2 3.9 -  Chloride 96 - 112 mEq/L 103 101 -  CO2 19 - 32 mEq/L 33(H) 33(H) -  Calcium 8.4 - 10.5 mg/dL 9.8 9.9 -  Total Protein 6.0 - 8.3 g/dL 6.3 6.6 6.6  Total Bilirubin 0.2 - 1.2 mg/dL 0.4 0.5 0.4  Alkaline Phos 39 - 117 U/L 63 62 61  AST 0 - 37 U/L 21 19 20   ALT 0 - 35 U/L 19 17 14      Imaging: Xr Foot 2 Views Left  Result Date: 07/13/2017 First MTP PIP and DIP narrowing was noted.  No erosive changes were noted.  No intertarsal joint space narrowing was noted.  Small calcaneal spur was noted. Impression: These findings are consistent with osteoarthritis of the foot.  Xr Foot 2 Views Right  Result Date: 07/13/2017 Severe first MTP narrowing was noted.  PIP and DIP narrowing was noted.  None of the other MTPs were involved.  No erosive changes were noted.  No intertarsal joint space narrowing was noted. Impression: These findings are consistent with osteoarthritis of the foot.  Xr Hand 2 View Left  Result Date: 07/13/2017 CMC narrowing was noted.  All PIP and DIP narrowing was  noted.  Some erosive changes were noted in the second DIP joint.  No MCP intercarpal radiocarpal joint space narrowing was noted. Impression: These findings are consistent with osteoarthritis of the hand.  Xr Hand 2 View Right  Result Date: 07/13/2017 CMC narrowing was noted.  All PIP and DIP narrowing was noted.  Subluxation of first and second DIP joint with severe narrowing was noted.  Second and third MCP joint narrowing was noted.  No intercarpal radiocarpal joint space narrowing was noted.  No erosive changes were noted. Impression: These findings are consistent with osteoarthritis of the hand.  Narrowing of MCP joint raises the concern of possible inflammatory arthritis.  Xr Knee 3 View Left  Result Date: 07/13/2017 Moderate medial compartment narrowing with no chondrocalcinosis was noted.  Moderate patellofemoral narrowing was noted. Impression: These findings are consistent with moderate osteoarthritis and moderate chondromalacia patella.  Xr Knee 3 View Right  Result Date: 07/13/2017 Moderate medial compartment narrowing with no chondrocalcinosis was noted.  Moderate patellofemoral narrowing was noted. Impression: These findings are consistent with moderate osteoarthritis and moderate chondromalacia patella.  Xr Lumbar Spine 2-3 Views  Result Date: 07/13/2017 No significant disc space narrowing was noted.  Facet joint arthropathy was noted.  Levoscoliosis was noted.  No SI joint changes were noted.   Speciality Comments: No specialty comments available.    Procedures:  No procedures performed Allergies: Erythromycin and Flagyl [metronidazole]   Assessment / Plan:     Visit Diagnoses: Primary osteoarthritis of both hands - DIP subluxationFirst and second MCP joint narrowing, right hand:  Iron 80, TIBC 372, %saturation 22, ferritin 15 were all normal.  I do not see any synovitis on examination.  A handout on joint muscle strengthening exercises were given.  Joint protection muscle  strengthening was discussed.  Primary osteoarthritis of both knees - Moderate chondromalacia patella and she has moderate osteoarthritis.  I handout on knee joint muscle strengthening exercises was given.  A list of natural anti-inflammatories was given.  Primary osteoarthritis of both feet-proper fitting shoes were discussed.  Scoliosis of lumbar spine, unspecified scoliosis type - Facet joint arthropathy   Positive ANA (antinuclear antibody) - Previous Dr. Kellie Simmeringruslow pt6/11/2017:beta-2 -, anticardiolipin-, C3 108, C4 22, uric acid 2.6, SCL 70 neg, RNP 2, sm -, Ro-, La-, dsDNA -, CCP -,ANA 1:80 speckled.  She has low titer ANA and low titer RNP.  At this point she does not have any clinical features of autoimmune disease.  We had detailed discussion regarding this.  If she develops any new symptoms she is supposed to notify me.  Age-related osteoporosis without current pathological fracture - 09/16/2014 AP spine L1-L4 -2.5 0.879 g/cm2.  She wants to get repeat bone density through her PCP  Other medical problems are listed as follows: TIA (transient ischemic attack)  History of diverticulosis  History of Barrett's esophagus  Anxiety and depression  History of hyperlipidemia    Orders: No orders of the defined types were placed in this encounter.  No orders of the defined types were placed in this encounter.   Face-to-face time spent with patient was 30 minutes. Greater than 50% of time was spent in counseling and coordination of care.  Follow-Up Instructions: Return in about 6 months (around 02/11/2018) for Osteoarthritis, Osteoporosis.  Bo Merino, MD  Note - This record has been created using Editor, commissioning.  Chart creation errors have been sought, but may not always  have been located. Such creation errors do not reflect on  the standard of medical care.

## 2017-08-04 ENCOUNTER — Other Ambulatory Visit: Payer: Self-pay | Admitting: Family Medicine

## 2017-08-10 ENCOUNTER — Ambulatory Visit: Payer: Medicare Other | Admitting: Rheumatology

## 2017-08-11 ENCOUNTER — Ambulatory Visit: Payer: Medicare HMO | Admitting: Rheumatology

## 2017-08-11 ENCOUNTER — Encounter: Payer: Self-pay | Admitting: Rheumatology

## 2017-08-11 VITALS — BP 136/60 | HR 53 | Resp 12 | Ht 61.42 in | Wt 117.0 lb

## 2017-08-11 DIAGNOSIS — M810 Age-related osteoporosis without current pathological fracture: Secondary | ICD-10-CM

## 2017-08-11 DIAGNOSIS — G459 Transient cerebral ischemic attack, unspecified: Secondary | ICD-10-CM

## 2017-08-11 DIAGNOSIS — M19071 Primary osteoarthritis, right ankle and foot: Secondary | ICD-10-CM

## 2017-08-11 DIAGNOSIS — M419 Scoliosis, unspecified: Secondary | ICD-10-CM

## 2017-08-11 DIAGNOSIS — M19041 Primary osteoarthritis, right hand: Secondary | ICD-10-CM | POA: Diagnosis not present

## 2017-08-11 DIAGNOSIS — M170 Bilateral primary osteoarthritis of knee: Secondary | ICD-10-CM | POA: Diagnosis not present

## 2017-08-11 DIAGNOSIS — Z8719 Personal history of other diseases of the digestive system: Secondary | ICD-10-CM

## 2017-08-11 DIAGNOSIS — R768 Other specified abnormal immunological findings in serum: Secondary | ICD-10-CM

## 2017-08-11 DIAGNOSIS — R69 Illness, unspecified: Secondary | ICD-10-CM | POA: Diagnosis not present

## 2017-08-11 DIAGNOSIS — Z8639 Personal history of other endocrine, nutritional and metabolic disease: Secondary | ICD-10-CM | POA: Diagnosis not present

## 2017-08-11 DIAGNOSIS — M19072 Primary osteoarthritis, left ankle and foot: Secondary | ICD-10-CM

## 2017-08-11 DIAGNOSIS — F329 Major depressive disorder, single episode, unspecified: Secondary | ICD-10-CM

## 2017-08-11 DIAGNOSIS — M19042 Primary osteoarthritis, left hand: Secondary | ICD-10-CM

## 2017-08-11 DIAGNOSIS — F419 Anxiety disorder, unspecified: Secondary | ICD-10-CM

## 2017-08-11 DIAGNOSIS — F32A Depression, unspecified: Secondary | ICD-10-CM

## 2017-08-11 NOTE — Patient Instructions (Signed)
Knee Exercises Ask your health care provider which exercises are safe for you. Do exercises exactly as told by your health care provider and adjust them as directed. It is normal to feel mild stretching, pulling, tightness, or discomfort as you do these exercises, but you should stop right away if you feel sudden pain or your pain gets worse.Do not begin these exercises until told by your health care provider. STRETCHING AND RANGE OF MOTION EXERCISES These exercises warm up your muscles and joints and improve the movement and flexibility of your knee. These exercises also help to relieve pain, numbness, and tingling. Exercise A: Knee Extension, Prone 1. Lie on your abdomen on a bed. 2. Place your left / right knee just beyond the edge of the surface so your knee is not on the bed. You can put a towel under your left / right thigh just above your knee for comfort. 3. Relax your leg muscles and allow gravity to straighten your knee. You should feel a stretch behind your left / right knee. 4. Hold this position for __________ seconds. 5. Scoot up so your knee is supported between repetitions. Repeat __________ times. Complete this stretch __________ times a day. Exercise B: Knee Flexion, Active  1. Lie on your back with both knees straight. If this causes back discomfort, bend your left / right knee so your foot is flat on the floor. 2. Slowly slide your left / right heel back toward your buttocks until you feel a gentle stretch in the front of your knee or thigh. 3. Hold this position for __________ seconds. 4. Slowly slide your left / right heel back to the starting position. Repeat __________ times. Complete this exercise __________ times a day. Exercise C: Quadriceps, Prone  1. Lie on your abdomen on a firm surface, such as a bed or padded floor. 2. Bend your left / right knee and hold your ankle. If you cannot reach your ankle or pant leg, loop a belt around your foot and grab the belt  instead. 3. Gently pull your heel toward your buttocks. Your knee should not slide out to the side. You should feel a stretch in the front of your thigh and knee. 4. Hold this position for __________ seconds. Repeat __________ times. Complete this stretch __________ times a day. Exercise D: Hamstring, Supine 1. Lie on your back. 2. Loop a belt or towel over the ball of your left / right foot. The ball of your foot is on the walking surface, right under your toes. 3. Straighten your left / right knee and slowly pull on the belt to raise your leg until you feel a gentle stretch behind your knee. ? Do not let your left / right knee bend while you do this. ? Keep your other leg flat on the floor. 4. Hold this position for __________ seconds. Repeat __________ times. Complete this stretch __________ times a day. STRENGTHENING EXERCISES These exercises build strength and endurance in your knee. Endurance is the ability to use your muscles for a long time, even after they get tired. Exercise E: Quadriceps, Isometric  1. Lie on your back with your left / right leg extended and your other knee bent. Put a rolled towel or small pillow under your knee if told by your health care provider. 2. Slowly tense the muscles in the front of your left / right thigh. You should see your kneecap slide up toward your hip or see increased dimpling just above the knee. This   motion will push the back of the knee toward the floor. 3. For __________ seconds, keep the muscle as tight as you can without increasing your pain. 4. Relax the muscles slowly and completely. Repeat __________ times. Complete this exercise __________ times a day. Exercise F: Straight Leg Raises - Quadriceps 1. Lie on your back with your left / right leg extended and your other knee bent. 2. Tense the muscles in the front of your left / right thigh. You should see your kneecap slide up or see increased dimpling just above the knee. Your thigh may  even shake a bit. 3. Keep these muscles tight as you raise your leg 4-6 inches (10-15 cm) off the floor. Do not let your knee bend. 4. Hold this position for __________ seconds. 5. Keep these muscles tense as you lower your leg. 6. Relax your muscles slowly and completely after each repetition. Repeat __________ times. Complete this exercise __________ times a day. Exercise G: Hamstring, Isometric 1. Lie on your back on a firm surface. 2. Bend your left / right knee approximately __________ degrees. 3. Dig your left / right heel into the surface as if you are trying to pull it toward your buttocks. Tighten the muscles in the back of your thighs to dig as hard as you can without increasing any pain. 4. Hold this position for __________ seconds. 5. Release the tension gradually and allow your muscles to relax completely for __________ seconds after each repetition. Repeat __________ times. Complete this exercise __________ times a day. Exercise H: Hamstring Curls  If told by your health care provider, do this exercise while wearing ankle weights. Begin with __________ weights. Then increase the weight by 1 lb (0.5 kg) increments. Do not wear ankle weights that are more than __________. 1. Lie on your abdomen with your legs straight. 2. Bend your left / right knee as far as you can without feeling pain. Keep your hips flat against the floor. 3. Hold this position for __________ seconds. 4. Slowly lower your leg to the starting position.  Repeat __________ times. Complete this exercise __________ times a day. Exercise I: Squats (Quadriceps) 1. Stand in front of a table, with your feet and knees pointing straight ahead. You may rest your hands on the table for balance but not for support. 2. Slowly bend your knees and lower your hips like you are going to sit in a chair. ? Keep your weight over your heels, not over your toes. ? Keep your lower legs upright so they are parallel with the table  legs. ? Do not let your hips go lower than your knees. ? Do not bend lower than told by your health care provider. ? If your knee pain increases, do not bend as low. 3. Hold the squat position for __________ seconds. 4. Slowly push with your legs to return to standing. Do not use your hands to pull yourself to standing. Repeat __________ times. Complete this exercise __________ times a day. Exercise J: Wall Slides (Quadriceps)  1. Lean your back against a smooth wall or door while you walk your feet out 18-24 inches (46-61 cm) from it. 2. Place your feet hip-width apart. 3. Slowly slide down the wall or door until your knees bend __________ degrees. Keep your knees over your heels, not over your toes. Keep your knees in line with your hips. 4. Hold for __________ seconds. Repeat __________ times. Complete this exercise __________ times a day. Exercise K: Straight Leg Raises -   Hip Abductors 1. Lie on your side with your left / right leg in the top position. Lie so your head, shoulder, knee, and hip line up. You may bend your bottom knee to help you keep your balance. 2. Roll your hips slightly forward so your hips are stacked directly over each other and your left / right knee is facing forward. 3. Leading with your heel, lift your top leg 4-6 inches (10-15 cm). You should feel the muscles in your outer hip lifting. ? Do not let your foot drift forward. ? Do not let your knee roll toward the ceiling. 4. Hold this position for __________ seconds. 5. Slowly return your leg to the starting position. 6. Let your muscles relax completely after each repetition. Repeat __________ times. Complete this exercise __________ times a day. Exercise L: Straight Leg Raises - Hip Extensors 1. Lie on your abdomen on a firm surface. You can put a pillow under your hips if that is more comfortable. 2. Tense the muscles in your buttocks and lift your left / right leg about 4-6 inches (10-15 cm). Keep your knee  straight as you lift your leg. 3. Hold this position for __________ seconds. 4. Slowly lower your leg to the starting position. 5. Let your leg relax completely after each repetition. Repeat __________ times. Complete this exercise __________ times a day. This information is not intended to replace advice given to you by your health care provider. Make sure you discuss any questions you have with your health care provider. Document Released: 12/04/2004 Document Revised: 10/15/2015 Document Reviewed: 11/26/2014 Elsevier Interactive Patient Education  2018 [employer] Hand Exercises Hand exercises can be helpful to almost anyone. These exercises can strengthen the hands, improve flexibility and movement, and increase blood flow to the hands. These results can make work and daily tasks easier. Hand exercises can be especially helpful for people who have joint pain from arthritis or have nerve damage from overuse (carpal tunnel syndrome). These exercises can also help people who have injured a hand. Most of these hand exercises are fairly gentle stretching routines. You can do them often throughout the day. Still, it is a good idea to ask your health care provider which exercises would be best for you. Warming your hands before exercise may help to reduce stiffness. You can do this with gentle massage or by placing your hands in warm water for 15 minutes. Also, make sure you pay attention to your level of hand pain as you begin an exercise routine. Exercises Knuckle Bend Repeat this exercise 5-10 times with each hand. 1. Stand or sit with your arm, hand, and all five fingers pointed straight up. Make sure your wrist is straight. 2. Gently and slowly bend your fingers down and inward until the tips of your fingers are touching the tops of your palm. 3. Hold this position for a few seconds. 4. Extend your fingers out to their original position, all pointing straight up again.  Finger Fan Repeat this  exercise 5-10 times with each hand. 1. Hold your arm and hand out in front of you. Keep your wrist straight. 2. Squeeze your hand into a fist. 3. Hold this position for a few seconds. 4. Fan out, or spread apart, your hand and fingers as much as possible, stretching every joint fully.  Tabletop Repeat this exercise 5-10 times with each hand. 1. Stand or sit with your arm, hand, and all five fingers pointed straight up. Make sure your wrist is straight.   2. Gently and slowly bend your fingers at the knuckles where they meet the hand until your hand is making an upside-down L shape. Your fingers should form a tabletop. 3. Hold this position for a few seconds. 4. Extend your fingers out to their original position, all pointing straight up again.  Making Os Repeat this exercise 5-10 times with each hand. 1. Stand or sit with your arm, hand, and all five fingers pointed straight up. Make sure your wrist is straight. 2. Make an O shape by touching your pointer finger to your thumb. Hold for a few seconds. Then open your hand wide. 3. Repeat this motion with each finger on your hand.  Table Spread Repeat this exercise 5-10 times with each hand. 1. Place your hand on a table with your palm facing down. Make sure your wrist is straight. 2. Spread your fingers out as much as possible. Hold this position for a few seconds. 3. Slide your fingers back together again. Hold for a few seconds.  Ball Grip  Repeat this exercise 10-15 times with each hand. 1. Hold a tennis ball or another soft ball in your hand. 2. While slowly increasing pressure, squeeze the ball as hard as possible. 3. Squeeze as hard as you can for 3-5 seconds. 4. Relax and repeat.  Wrist Curls Repeat this exercise 10-15 times with each hand. 1. Sit in a chair that has armrests. 2. Hold a light weight in your hand, such as a dumbbell that weighs 1-3 pounds (0.5-1.4 kg). Ask your health care provider what weight would be best for  you. 3. Rest your hand just over the end of the chair arm with your palm facing up. 4. Gently pivot your wrist up and down while holding the weight. Do not twist your wrist from side to side.  Contact a health care provider if:  Your hand pain or discomfort gets much worse when you do an exercise.  Your hand pain or discomfort does not improve within 2 hours after you exercise. If you have any of these problems, stop doing these exercises right away. Do not do them again unless your health care provider says that you can. Get help right away if:  You develop sudden, severe hand pain. If this happens, stop doing these exercises right away. Do not do them again unless your health care provider says that you can. This information is not intended to replace advice given to you by your health care provider. Make sure you discuss any questions you have with your health care provider. Document Released: 01/01/2015 Document Revised: 06/28/2015 Document Reviewed: 07/31/2014 Elsevier Interactive Patient Education  2018 [employer]  Natural anti-inflammatories  You can purchase these at [employer], [employer] or online.  . Turmeric (capsules)  . Ginger (ginger root or capsules)  . Omega 3 (Fish, flax seeds, chia seeds, walnuts, almonds)  . Tart cherry (dried or extract)   Patient should be under the care of a physician while taking these supplements. This may not be reproduced without the permission of Dr. Pollyann SavoyShaili Farzana Koci.

## 2017-08-13 ENCOUNTER — Telehealth: Payer: Self-pay | Admitting: Rheumatology

## 2017-08-13 NOTE — Telephone Encounter (Signed)
Patient called stating she is scheduled for an ultrasound on 09/09/17 but never discussed that with Dr. Corliss Skainseveshwar and doesn't know if she needs that appointment.

## 2017-08-15 ENCOUNTER — Other Ambulatory Visit: Payer: Self-pay | Admitting: Family Medicine

## 2017-08-17 ENCOUNTER — Ambulatory Visit: Payer: Medicare HMO | Admitting: Rheumatology

## 2017-08-17 NOTE — Telephone Encounter (Signed)
Besides positive ANA all her autoimmune work-up was negative.  I did not see any synovitis on examination.  If she continues to have pain and discomfort and swelling in her hands then she should keep the appointment for the ultrasound of her hands.

## 2017-08-17 NOTE — Telephone Encounter (Signed)
Patient states she is not having much trouble with her hands at this time. Patient states she saw that her RNP antibody was positive via my chart. Patient would like to know if she should be concerned about that. Please advise

## 2017-08-18 NOTE — Telephone Encounter (Signed)
I called patient and discussed that we will go over her lab results at the follow-up visit.  I would not make any changes based on her lab results at this point.

## 2017-08-28 ENCOUNTER — Telehealth: Payer: Self-pay | Admitting: Family Medicine

## 2017-08-28 MED ORDER — CITALOPRAM HYDROBROMIDE 20 MG PO TABS: 40.0000 mg | ORAL_TABLET | Freq: Every day | ORAL | 3 refills | Status: DC

## 2017-08-28 NOTE — Telephone Encounter (Unsigned)
Copied from CRM [phone]. Topic: Quick Communication - See Telephone Encounter >> Aug 28, 2017  2:20 PM Floria RavelingStovall, Shana A wrote: CRM for notification. See Telephone encounter for: 08/28/17.  Pt  called in and stated that the citalopram (CELEXA) 20 MG tablet [[id number]][[id number]] is still not at the pharmacy.  Told her it was sent on 7/2 ,they told her that they do not have it.  Can this be resent?     [facility] [address].  I confirmed it was correct

## 2017-08-28 NOTE — Telephone Encounter (Signed)
Medication filled to pharmacy as requested.  Will check before I leave work to verify it was received by pharmacy.

## 2017-08-28 NOTE — Telephone Encounter (Signed)
Called and verified with pharmacy that Rx was received. Called and advised pt that this will be available today.

## 2017-09-09 ENCOUNTER — Other Ambulatory Visit: Payer: Medicare HMO | Admitting: Rheumatology

## 2017-09-10 ENCOUNTER — Encounter: Payer: Self-pay | Admitting: Family Medicine

## 2017-09-10 ENCOUNTER — Other Ambulatory Visit: Payer: Self-pay

## 2017-09-10 ENCOUNTER — Ambulatory Visit: Payer: Self-pay

## 2017-09-10 ENCOUNTER — Ambulatory Visit (INDEPENDENT_AMBULATORY_CARE_PROVIDER_SITE_OTHER): Payer: Medicare HMO | Admitting: Family Medicine

## 2017-09-10 VITALS — BP 128/70 | HR 51 | Temp 98.1°F | Resp 16 | Ht 61.0 in | Wt 116.4 lb

## 2017-09-10 DIAGNOSIS — R5383 Other fatigue: Secondary | ICD-10-CM

## 2017-09-10 DIAGNOSIS — E7800 Pure hypercholesterolemia, unspecified: Secondary | ICD-10-CM | POA: Diagnosis not present

## 2017-09-10 DIAGNOSIS — M810 Age-related osteoporosis without current pathological fracture: Secondary | ICD-10-CM | POA: Diagnosis not present

## 2017-09-10 LAB — HEPATIC FUNCTION PANEL
ALT: 15 U/L (ref 0–35)
AST: 18 U/L (ref 0–37)
Albumin: 4.4 g/dL (ref 3.5–5.2)
Alkaline Phosphatase: 51 U/L (ref 39–117)
Bilirubin, Direct: 0.1 mg/dL (ref 0.0–0.3)
Total Bilirubin: 0.4 mg/dL (ref 0.2–1.2)
Total Protein: 6.4 g/dL (ref 6.0–8.3)

## 2017-09-10 LAB — LIPID PANEL
Cholesterol: 235 mg/dL — ABNORMAL HIGH (ref 0–200)
HDL: 76.5 mg/dL (ref >39.00)
LDL Cholesterol: 143 mg/dL — ABNORMAL HIGH (ref 0–99)
NonHDL: 158.61
Total CHOL/HDL Ratio: 3
Triglycerides: 77 mg/dL (ref 0.0–149.0)
VLDL: 15.4 mg/dL (ref 0.0–40.0)

## 2017-09-10 LAB — CBC WITH DIFFERENTIAL/PLATELET
Basophils Absolute: 0 10*3/uL (ref 0.0–0.1)
Basophils Relative: 1.1 % (ref 0.0–3.0)
Eosinophils Absolute: 0.1 10*3/uL (ref 0.0–0.7)
Eosinophils Relative: 3.6 % (ref 0.0–5.0)
HCT: 40.2 % (ref 36.0–46.0)
Hemoglobin: 13.9 g/dL (ref 12.0–15.0)
Lymphocytes Relative: 40.3 % (ref 12.0–46.0)
Lymphs Abs: 1.7 10*3/uL (ref 0.7–4.0)
MCHC: 34.6 g/dL (ref 30.0–36.0)
MCV: 93.2 fl (ref 78.0–100.0)
Monocytes Absolute: 0.3 10*3/uL (ref 0.1–1.0)
Monocytes Relative: 8.1 % (ref 3.0–12.0)
Neutro Abs: 1.9 10*3/uL (ref 1.4–7.7)
Neutrophils Relative %: 46.9 % (ref 43.0–77.0)
Platelets: 231 10*3/uL (ref 150.0–400.0)
RBC: 4.31 Mil/uL (ref 3.87–5.11)
RDW: 13.1 % (ref 11.5–15.5)
WBC: 4.1 10*3/uL (ref 4.0–10.5)

## 2017-09-10 LAB — BASIC METABOLIC PANEL
BUN: 11 mg/dL (ref 6–23)
CO2: 32 mEq/L (ref 19–32)
Calcium: 9.6 mg/dL (ref 8.4–10.5)
Chloride: 101 mEq/L (ref 96–112)
Creatinine, Ser: 0.97 mg/dL (ref 0.40–1.20)
GFR: 60.32 mL/min (ref >60.00)
Glucose, Bld: 90 mg/dL (ref 70–99)
Potassium: 4 mEq/L (ref 3.5–5.1)
Sodium: 138 mEq/L (ref 135–145)

## 2017-09-10 LAB — TSH: TSH: 1.13 u[IU]/mL (ref 0.35–4.50)

## 2017-09-10 LAB — VITAMIN D 25 HYDROXY (VIT D DEFICIENCY, FRACTURES): VITD: 39.69 ng/mL (ref 30.00–100.00)

## 2017-09-10 NOTE — Assessment & Plan Note (Signed)
Chronic problem.  Tolerating statin w/o difficulty.  Check labs in advance of upcoming CPE

## 2017-09-10 NOTE — Telephone Encounter (Signed)
FYI patient has an appointment today at 1:45pm

## 2017-09-10 NOTE — Progress Notes (Signed)
   Subjective:    Patient ID: Regina Gonzalez, female    DOB: 02/23/1945, 72 y.o.   MRN: [id number]  HPI Fatigue- sxs started 2-3 days ago.  'I just want to sleep'.  Tired, achy.  No changes to routine, medications, supplements.  Pt lost both her daughter and husband in the same year.  'I just feel like i'm getting sick'.  'I wanted to lay down and sleep rather than drive over here'.  Mild, dull HA.  Nausea earlier this week, no vomiting.   Review of Systems For ROS see HPI     Objective:   Physical Exam  Constitutional: She is oriented to person, place, and time. She appears well-developed and well-nourished. No distress.  HENT:  Head: Normocephalic and atraumatic.  Eyes: Pupils are equal, round, and reactive to light. Conjunctivae and EOM are normal.  Neck: Normal range of motion. Neck supple. No thyromegaly present.  Cardiovascular: Normal rate, regular rhythm, normal heart sounds and intact distal pulses.  No murmur heard. Pulmonary/Chest: Effort normal and breath sounds normal. No respiratory distress.  Abdominal: Soft. She exhibits no distension. There is no tenderness.  Musculoskeletal: She exhibits no edema.  Lymphadenopathy:    She has no cervical adenopathy.  Neurological: She is alert and oriented to person, place, and time.  Skin: Skin is warm and dry.  Psychiatric: She has a normal mood and affect. Her behavior is normal.  Vitals reviewed.         Assessment & Plan:  Fatigue- recurring problem.  This seems to be cyclical for pt.  Given her recent rheumatology work up I question whether pt has fibromyalgia.  Check labs to r/o metabolic cause of fatigue.  Encouraged ongoing tx of depression and anxiety, adequate sleep, and regular exercise.  Will continue to follow.

## 2017-09-10 NOTE — Telephone Encounter (Signed)
Pt. called to report she has been more weak and fatigued.  Reported onset of symptoms on Tuesday.  Denied fever/ chills.  Stated has intermittent headache and stomachache.  C/o some nausea; denied vomiting.  Related having slept  for 9 hrs., on Tuesday night, and then napped for 2 hr. intervals, twice yesterday.  Stated that she didn't know if her sx's could be related to a virus.  Stated she has intermittent pain in hands and feet, due to arthritis.  Stated her main concern today is how tired she is.  Denied chest pain.  Stated she noted being more "winded", recently in going up an incline, and in trying to bring her trash cans in, from the street.  Reported an occasional cough to clear her throat; reported she has had for a long time.  Related recent hx. of being referred to Rheumatologist for "positive ANA".  Was given diagnosis of a "Connective Tissue Disease."  Pt. voiced frustration that she never got a good explanation of her disease, or what to expect.  Stated "there was a lack of communication and understanding", when she saw the Rheumatologist.  Was advised to f/u in 6 mo. with Rheumatologist.  Questioned if her current symptoms of weakness/ fatigue could be related to the Connective Tissue Disease?  Stated that she knows that something is not right.  Appt. Scheduled with PCP at 1:45 PM.  Agrees with plan.          Reason for Disposition . [1] MODERATE weakness (i.e., interferes with work, school, normal activities) AND [2] cause unknown  (Exceptions: weakness with acute minor illness, or weakness from poor fluid intake)  Answer Assessment - Initial Assessment Questions 1. DESCRIPTION: "Describe how you are feeling."     Feels generally weak; very fatigued; intermittent headache and stomachache  (Recent 9 hr. Night sleep, and then napped x 2 during day)  2. SEVERITY: "How bad is it?"  "Can you stand and walk?"   - MILD - Feels weak or tired, but does not interfere with work, school or normal  activities   - MODERATE - Able to stand and walk; weakness interferes with work, school, or normal activities   - SEVERE - Unable to stand or walk    moderate 3. ONSET:  "When did the weakness begin?"     Tuesday 4. CAUSE: "What do you think is causing the weakness?"     Unsure; had work-up from Rheumatologist; "Connective Tissue Disease" 5. MEDICINES: "Have you recently started a new medicine or had a change in the amount of a medicine?"    Started Vita B6 and Fish Oil recently 6. OTHER SYMPTOMS: "Do you have any other symptoms?" (e.g., chest pain, fever, cough, SOB, vomiting, diarrhea, bleeding, other areas of pain)     Denied fever/ chills; c/o nausea, but no vomiting 7. PREGNANCY: "Is there any chance you are pregnant?" "When was your last menstrual period?"     n/a  Protocols used: WEAKNESS (GENERALIZED) AND FATIGUE-A-AH

## 2017-09-10 NOTE — Patient Instructions (Signed)
Follow up as needed or scheduled We'll notify you of your lab results and make any changes if needed Continue to stretch and get regular exercise as you are able Sleep when you need to sleep Drink plenty of fluids Call with any questions or concerns Hang in there!

## 2017-09-10 NOTE — Assessment & Plan Note (Signed)
Check Vit D level in advance of upcoming CPE

## 2017-09-17 ENCOUNTER — Ambulatory Visit: Payer: Medicare Other | Admitting: Rheumatology

## 2017-10-02 ENCOUNTER — Encounter: Payer: Self-pay | Admitting: Family Medicine

## 2017-10-02 ENCOUNTER — Ambulatory Visit (INDEPENDENT_AMBULATORY_CARE_PROVIDER_SITE_OTHER): Payer: Medicare HMO | Admitting: Family Medicine

## 2017-10-02 ENCOUNTER — Other Ambulatory Visit: Payer: Self-pay

## 2017-10-02 VITALS — BP 110/62 | HR 56 | Temp 97.9°F | Resp 15 | Ht 61.25 in | Wt 115.8 lb

## 2017-10-02 DIAGNOSIS — R490 Dysphonia: Secondary | ICD-10-CM | POA: Diagnosis not present

## 2017-10-02 DIAGNOSIS — Z0000 Encounter for general adult medical examination without abnormal findings: Secondary | ICD-10-CM | POA: Diagnosis not present

## 2017-10-02 MED ORDER — CETIRIZINE HCL 10 MG PO TABS: 10.0000 mg | ORAL_TABLET | Freq: Every day | ORAL | 11 refills | Status: DC

## 2017-10-02 NOTE — Patient Instructions (Signed)
Follow up in 6 months to recheck cholesterol Continue to take your Simvastatin daily for the high cholesterol Continue to exercise regularly Call with any questions or concerns Happy Labor Day!!

## 2017-10-02 NOTE — Assessment & Plan Note (Signed)
Pt's PE WNL.  UTD on mammo, colonoscopy, pneumonia vaccines.  Reviewed recent labs w/ pt.  Anticipatory guidance provided.

## 2017-10-02 NOTE — Progress Notes (Signed)
   Subjective:    Patient ID: Regina Gonzalez, female    DOB: December 11, 1945, 72 y.o.   MRN: [id number]  HPI CPE- UTD on Pneumonia vaccines, declines flu shot, UTD on mammo, colonoscopy.   Review of Systems Patient reports no vision/ hearing changes, adenopathy,fever, weight change, swallowing issues, chest pain, palpitations, edema, persistant/recurrent cough, hemoptysis, dyspnea (rest/exertional/paroxysmal nocturnal), gastrointestinal bleeding (melena, rectal bleeding), abdominal pain, significant heartburn, bowel changes, GU symptoms (dysuria, hematuria, incontinence), Gyn symptoms (abnormal  bleeding, pain),  syncope, focal weakness, memory loss, numbness & tingling, skin/hair/nail changes, abnormal bruising or bleeding, anxiety, or depression.   + hoarseness- hx of smoking    Objective:   Physical Exam General Appearance:    Alert, cooperative, no distress, appears stated age  Head:    Normocephalic, without obvious abnormality, atraumatic  Eyes:    PERRL, conjunctiva/corneas clear, EOM's intact, fundi    benign, both eyes  Ears:    Normal TM's and external ear canals, both ears  Nose:   Nares normal, septum midline, mucosa normal, no drainage    or sinus tenderness  Throat:   Lips, mucosa, and tongue normal; teeth and gums normal  Neck:   Supple, symmetrical, trachea midline, no adenopathy;    Thyroid: no enlargement/tenderness/nodules  Back:     Symmetric, no curvature, ROM normal, no CVA tenderness  Lungs:     Clear to auscultation bilaterally, respirations unlabored  Chest Wall:    No tenderness or deformity   Heart:    Regular rate and rhythm, S1 and S2 normal, no murmur, rub   or gallop  Breast Exam:    Deferred to mammo  Abdomen:     Soft, non-tender, bowel sounds active all four quadrants,    no masses, no organomegaly  Genitalia:    Deferred to GYN  Rectal:    Extremities:   Extremities normal, atraumatic, no cyanosis or edema  Pulses:   2+ and symmetric all extremities    Skin:   Skin color, texture, turgor normal, no rashes or lesions  Lymph nodes:   Cervical, supraclavicular, and axillary nodes normal  Neurologic:   CNII-XII intact, normal strength, sensation and reflexes    throughout          Assessment & Plan:

## 2017-11-11 DIAGNOSIS — J343 Hypertrophy of nasal turbinates: Secondary | ICD-10-CM | POA: Diagnosis not present

## 2017-11-11 DIAGNOSIS — Z87891 Personal history of nicotine dependence: Secondary | ICD-10-CM | POA: Diagnosis not present

## 2017-11-11 DIAGNOSIS — R490 Dysphonia: Secondary | ICD-10-CM | POA: Diagnosis not present

## 2017-11-11 DIAGNOSIS — Z9089 Acquired absence of other organs: Secondary | ICD-10-CM | POA: Diagnosis not present

## 2017-11-13 DIAGNOSIS — K219 Gastro-esophageal reflux disease without esophagitis: Secondary | ICD-10-CM | POA: Diagnosis not present

## 2017-11-13 DIAGNOSIS — K581 Irritable bowel syndrome with constipation: Secondary | ICD-10-CM | POA: Diagnosis not present

## 2017-11-13 DIAGNOSIS — D509 Iron deficiency anemia, unspecified: Secondary | ICD-10-CM | POA: Diagnosis not present

## 2017-11-23 ENCOUNTER — Ambulatory Visit (INDEPENDENT_AMBULATORY_CARE_PROVIDER_SITE_OTHER): Payer: Medicare HMO

## 2017-11-23 DIAGNOSIS — Z23 Encounter for immunization: Secondary | ICD-10-CM

## 2017-11-25 ENCOUNTER — Other Ambulatory Visit: Payer: Self-pay | Admitting: Physician Assistant

## 2017-11-25 DIAGNOSIS — R1011 Right upper quadrant pain: Secondary | ICD-10-CM

## 2017-11-27 ENCOUNTER — Ambulatory Visit
Admission: RE | Admit: 2017-11-27 | Discharge: 2017-11-27 | Disposition: A | Discharge status: Home / Self Care | Payer: Medicare HMO | Source: Ambulatory Visit | Attending: Physician Assistant | Admitting: Physician Assistant

## 2017-11-27 ENCOUNTER — Other Ambulatory Visit: Payer: Medicare HMO

## 2017-11-27 DIAGNOSIS — R1011 Right upper quadrant pain: Secondary | ICD-10-CM | POA: Diagnosis not present

## 2017-11-27 DIAGNOSIS — K7689 Other specified diseases of liver: Secondary | ICD-10-CM | POA: Diagnosis not present

## 2017-12-01 ENCOUNTER — Other Ambulatory Visit (HOSPITAL_COMMUNITY): Payer: Self-pay | Admitting: Gastroenterology

## 2017-12-01 ENCOUNTER — Other Ambulatory Visit (HOSPITAL_COMMUNITY): Payer: Self-pay | Admitting: Physician Assistant

## 2017-12-01 ENCOUNTER — Other Ambulatory Visit: Payer: Medicare HMO

## 2017-12-01 DIAGNOSIS — R110 Nausea: Secondary | ICD-10-CM

## 2017-12-01 DIAGNOSIS — R1011 Right upper quadrant pain: Secondary | ICD-10-CM

## 2017-12-09 ENCOUNTER — Ambulatory Visit (HOSPITAL_COMMUNITY)
Admission: RE | Admit: 2017-12-09 | Discharge: 2017-12-09 | Disposition: A | Discharge status: Home / Self Care | Payer: Medicare HMO | Source: Ambulatory Visit | Attending: Gastroenterology | Admitting: Gastroenterology

## 2017-12-09 DIAGNOSIS — R110 Nausea: Secondary | ICD-10-CM | POA: Diagnosis present

## 2017-12-09 DIAGNOSIS — R1011 Right upper quadrant pain: Secondary | ICD-10-CM | POA: Diagnosis not present

## 2017-12-09 MED ORDER — TECHNETIUM TC 99M MEBROFENIN IV KIT
5.4500 | PACK | Freq: Once | INTRAVENOUS | Status: AC | PRN
  Administered 2017-12-09: 5.45 via INTRAVENOUS

## 2017-12-15 ENCOUNTER — Other Ambulatory Visit: Payer: Self-pay | Admitting: Family Medicine

## 2017-12-15 NOTE — Telephone Encounter (Signed)
Last OV 10/02/17 Alprazolam last filled 07/30/17 #60 with 3

## 2017-12-16 DIAGNOSIS — R69 Illness, unspecified: Secondary | ICD-10-CM | POA: Diagnosis not present

## 2017-12-17 DIAGNOSIS — R69 Illness, unspecified: Secondary | ICD-10-CM | POA: Diagnosis not present

## 2017-12-24 ENCOUNTER — Other Ambulatory Visit: Payer: Self-pay | Admitting: Family Medicine

## 2018-01-03 ENCOUNTER — Emergency Department (HOSPITAL_BASED_OUTPATIENT_CLINIC_OR_DEPARTMENT_OTHER)
Admission: EM | Admit: 2018-01-03 | Discharge: 2018-01-03 | Disposition: A | Discharge status: Home / Self Care | Payer: Medicare HMO | Attending: Emergency Medicine | Admitting: Emergency Medicine

## 2018-01-03 ENCOUNTER — Encounter (HOSPITAL_BASED_OUTPATIENT_CLINIC_OR_DEPARTMENT_OTHER): Payer: Self-pay | Admitting: Emergency Medicine

## 2018-01-03 ENCOUNTER — Other Ambulatory Visit: Payer: Self-pay

## 2018-01-03 ENCOUNTER — Emergency Department (HOSPITAL_BASED_OUTPATIENT_CLINIC_OR_DEPARTMENT_OTHER): Payer: Medicare HMO

## 2018-01-03 DIAGNOSIS — Z87891 Personal history of nicotine dependence: Secondary | ICD-10-CM | POA: Diagnosis not present

## 2018-01-03 DIAGNOSIS — R519 Headache, unspecified: Secondary | ICD-10-CM

## 2018-01-03 DIAGNOSIS — R51 Headache: Secondary | ICD-10-CM | POA: Insufficient documentation

## 2018-01-03 DIAGNOSIS — G8929 Other chronic pain: Secondary | ICD-10-CM

## 2018-01-03 DIAGNOSIS — Z79899 Other long term (current) drug therapy: Secondary | ICD-10-CM | POA: Diagnosis not present

## 2018-01-03 LAB — SEDIMENTATION RATE: Sed Rate: 8 mm/hr (ref 0–22)

## 2018-01-03 NOTE — ED Notes (Signed)
ED Provider at bedside. 

## 2018-01-03 NOTE — ED Triage Notes (Signed)
R sided facial and head pain intermittently over the past 2 months. Pain radiates into the back of her neck today.

## 2018-01-03 NOTE — ED Provider Notes (Signed)
[facility] EMERGENCY DEPARTMENT Provider Note   CSN: [id number] Arrival date & time: 01/03/18  4098     History   Chief Complaint Chief Complaint  Patient presents with  . Facial Pain    HPI Regina Gonzalez is a 72 y.o. female.  HPI Patient with facial pain.  Has had for the last few months.  Right side of her head.  Comes and goes.  No numbness or weakness with it.  Does not worsen with eating.  But sometimes goes to the neck.  No vision changes.  No weight loss.  No sinus drainage.  States she is been to her primary care doctor but not told him biotics it was not bothering her that much of the time.  Pain is sharp.  No trauma.  States there is been a couple times where she is stumbled where she is worried. Past Medical History:  Diagnosis Date  . Allergic rhinitis   . Anxiety   . Arthritis    HANDS  . Benign liver cyst    multiple  . Chronic constipation   . Depression   . Diverticulosis of colon   . Emphysematous COPD (HCC)   . External hemorrhoid   . GERD (gastroesophageal reflux disease)   . History of colon polyps   . History of Helicobacter pylori infection   . History of TIA (transient ischemic attack)    apr 2013-- no residual  . Hyperlipidemia   . Occipital neuralgia of left side 12/08/2014  . Osteoporosis   . Pancreatic cyst    simple benign  . PONV (postoperative nausea and vomiting)   . Prolapsed internal hemorrhoids, grade 3   . Simple renal cyst    right   . Wears glasses   . Wears partial dentures    UPPER    Patient Active Problem List   Diagnosis Date Noted  . Gout 07/07/2017  . Moderate episode of recurrent major depressive disorder (HCC) 06/19/2017  . Female bladder prolapse 03/15/2015  . Trapezius muscle spasm 12/04/2014  . Lateral epicondylitis of right elbow 12/04/2014  . History of tobacco abuse 09/15/2014  . Memory loss 05/11/2014  . Postmenopausal bleeding 07/08/2013  . External hemorrhoid, bleeding 04/29/2013  .  Osteoporosis 07/05/2012  . TIA (transient ischemic attack) 07/05/2012  . Routine general medical examination at a health care facility 07/05/2012  . Hypopigmentation 09/01/2010  . GERD 04/15/2010  . IRRITABLE BOWEL SYNDROME 04/15/2010  . FLATULENCE ERUCTATION AND GAS PAIN 04/15/2010  . SHORTNESS OF BREATH 02/20/2010  . HELICOBACTER PYLORI GASTRITIS, HX OF 01/10/2010  . Hyperlipidemia 12/06/2009  . ANEMIA-NOS 12/06/2009  . Depression 12/06/2009  . ALLERGIC RHINITIS 12/06/2009  . BARRETTS ESOPHAGUS 12/06/2009    Past Surgical History:  Procedure Laterality Date  . APPENDECTOMY  1960  . COLONOSCOPY WITH PROPOFOL  last one 10-25-2013   polypectomy  . D & C HYSTEROSCOPY W/ ENDOMETRIAL POLYPECTOMY    . ESOPHAGOGASTRODUODENOSCOPY  last one 03-04-2013  . EXCISION BILATERAL BREAST CYST  1995  . HEMORRHOID SURGERY N/A 04/06/2014   Procedure: Hemmorhoidopexy with anal canal biopsy;  Surgeon: Romie Levee, MD;  Location: [facility];  Service: General;  Laterality: N/A;  . LAPAROSCOPIC RIGHT OVARIAN CYSTECTOMY/  D & C HYSTEROSCOPY ENDOMETRIAL POLYPECTOMY  2011  . TONSILLECTOMY AND ADENOIDECTOMY  1955  . TRANSTHORACIC ECHOCARDIOGRAM  08-13-2012   normal LV/  ef 60-65%/  mild AR/  trivial MR     OB History    Gravida  3   Para  2   Term      Preterm      AB  1   Living  2     SAB      TAB      Ectopic      Multiple      Live Births           Obstetric Comments  Younger daughter passes away from pancreatitis- 11/2016         Home Medications    Prior to Admission medications   Medication Sig Start Date End Date Taking? Authorizing Provider  ALPRAZolam Prudy Feeler) 0.5 MG tablet TAKE 1 TO 2 TABLETS BY MOUTH 3 TIMES A DAY AS NEEDED FOR ANXIETY 12/15/17   Sheliah Hatch, MD  Ascorbic Acid (VITAMIN C PO) Take by mouth daily.    [provider]  Calcium Carb-Cholecalciferol (CALCIUM 600 + D PO) Take 2 tablets by mouth daily.    [provider]  cetirizine (ZYRTEC) 10 MG tablet Take 1 tablet (10 mg total) by mouth daily. 10/02/17   Sheliah Hatch, MD  citalopram (CELEXA) 20 MG tablet TAKE 2 TABLETS BY MOUTH EVERY DAY 12/24/17   Sheliah Hatch, MD  cyanocobalamin 500 MCG tablet Take 500 mcg by mouth daily.    [provider]  dicyclomine (BENTYL) 20 MG tablet TAKE ONE TABLET BY MOUTH THREE TIMES DAILY BETWEEN MEALS AS NEEDED. 06/03/16   Sheliah Hatch, MD  Docusate Calcium (STOOL SOFTENER PO) Take by mouth.    [provider]  ferrous sulfate 325 (65 FE) MG tablet TAKE 1 TABLET (325 MG TOTAL) BY MOUTH DAILY WITH BREAKFAST. 02/25/16   Sheliah Hatch, MD  Multiple Vitamins-Minerals (MULTIVITAMIN WITH MINERALS) tablet Take 1 tablet by mouth daily.     [provider]  pantoprazole (PROTONIX) 40 MG tablet TAKE 1 TABLET BY MOUTH EVERY DAY Patient taking differently: Every 3rd day 08/04/17   Sheliah Hatch, MD  simvastatin (ZOCOR) 20 MG tablet TAKE 1 TABLET BY MOUTH AT BEDTIME 08/17/17   Sheliah Hatch, MD    Family History Family History  Problem Relation Age of Onset  . Alcoholism Father   . Diabetes type II Brother   . Heart attack Brother   . Cirrhosis Mother        non-alcoholic  . Multiple sclerosis Daughter   . Pancreatitis Daughter   . Colon cancer Neg Hx     Social History Social History   Tobacco Use  . Smoking status: Former Smoker    Packs/day: 1.00    Years: 17.00    Pack years: 17.00    Types: Cigarettes    Last attempt to quit: 04/02/1988    Years since quitting: 29.7  . Smokeless tobacco: Never Used  Substance Use Topics  . Alcohol use: No  . Drug use: No     Allergies   Erythromycin and Flagyl [metronidazole]   Review of Systems Review of Systems  Constitutional: Negative for appetite change.  HENT: Negative for congestion.   Respiratory: Negative for choking.   Cardiovascular: Negative for chest pain.  Gastrointestinal: Negative  for abdominal distention.  Genitourinary: Negative for frequency.  Musculoskeletal: Negative for back pain.  Neurological: Positive for headaches. Negative for speech difficulty, weakness and light-headedness.  Psychiatric/Behavioral: Negative for confusion.     Physical Exam Updated Vital Signs BP (!) 135/48 (BP Location: Right Arm)   Pulse (!) 58   Temp 97.8 F (  36.6 C) (Oral)   Resp 16   Ht 5\' 1"  (1.549 m)   Wt 52.6 kg   LMP 06/16/1997   SpO2 100%   BMI 21.92 kg/m   Physical Exam  Constitutional: She appears well-developed.  HENT:  Head: Atraumatic.   tenderness of her right temporal area.  Eyes: Pupils are equal, round, and reactive to light.  Neck: Neck supple.  Cardiovascular: Normal rate.  Pulmonary/Chest: Effort normal.  Abdominal: Soft.  Musculoskeletal: She exhibits no tenderness.  Neurological: She is alert.  Skin: Skin is warm. Capillary refill takes less than 2 seconds.     ED Treatments / Results  Labs (all labs ordered are listed, but only abnormal results are displayed) Labs Reviewed  SEDIMENTATION RATE    EKG None  Radiology Ct Head Wo Contrast  Result Date: 01/03/2018 CLINICAL DATA:  Right-sided headache and facial pain intermittently for 2 months which is radiating into the back of the neck today. Light sensitivity. EXAM: CT HEAD WITHOUT CONTRAST TECHNIQUE: Contiguous axial images were obtained from the base of the skull through the vertex without intravenous contrast. COMPARISON:  07/07/2014 FINDINGS: Brain: There is no evidence of acute infarct, intracranial hemorrhage, mass, midline shift, or extra-axial fluid collection. The ventricles and sulci are normal. Vascular: No hyperdense vessel. Skull: No fracture or focal osseous lesion. Sinuses/Orbits: Visualized paranasal sinuses and mastoid air cells are clear. Orbits are unremarkable. Other: None. IMPRESSION: Negative head CT. Electronically Signed   By: Sebastian AcheAllen  Grady M.D.   On: 01/03/2018 09:47      Procedures Procedures (including critical care time)  Medications Ordered in ED Medications - No data to display   Initial Impression / Assessment and Plan / ED Course  I have reviewed the triage vital signs and the nursing notes.  Pertinent labs & imaging results that were available during my care of the patient were reviewed by me and considered in my medical decision making (see chart for details).     Patient with headaches and temporal pain.  Had some unsteadiness.  Benign exam for me however.  Tenderness over temporal artery.  Normal sed rate and head CT reassuring.  Follow-up as an outpatient with her PCP.  Final Clinical Impressions(s) / ED Diagnoses   Final diagnoses:  Chronic nonintractable headache, unspecified headache type    ED Discharge Orders    None       Benjiman CorePickering, Jeneen Doutt, MD 01/03/18 1542

## 2018-01-06 ENCOUNTER — Encounter: Payer: Self-pay | Admitting: Family Medicine

## 2018-01-06 ENCOUNTER — Other Ambulatory Visit: Payer: Self-pay

## 2018-01-06 ENCOUNTER — Ambulatory Visit (INDEPENDENT_AMBULATORY_CARE_PROVIDER_SITE_OTHER): Payer: Medicare HMO | Admitting: Family Medicine

## 2018-01-06 VITALS — BP 134/84 | HR 81 | Temp 98.0°F | Resp 16 | Ht 61.0 in | Wt 118.1 lb

## 2018-01-06 DIAGNOSIS — R0982 Postnasal drip: Secondary | ICD-10-CM | POA: Diagnosis not present

## 2018-01-06 DIAGNOSIS — M797 Fibromyalgia: Secondary | ICD-10-CM | POA: Diagnosis not present

## 2018-01-06 MED ORDER — FLUTICASONE PROPIONATE 50 MCG/ACT NA SUSP: 2.0000 | Freq: Every day | NASAL | 6 refills | Status: DC

## 2018-01-06 MED ORDER — CETIRIZINE HCL 10 MG PO TABS: 10.0000 mg | ORAL_TABLET | Freq: Every day | ORAL | 11 refills | Status: DC

## 2018-01-06 MED ORDER — LORATADINE 10 MG PO TABS: 10.0000 mg | ORAL_TABLET | Freq: Every day | ORAL | 11 refills | Status: DC

## 2018-01-06 MED ORDER — DULOXETINE HCL 30 MG PO CPEP: 30.0000 mg | ORAL_CAPSULE | Freq: Every day | ORAL | 3 refills | Status: DC

## 2018-01-06 NOTE — Progress Notes (Signed)
   Subjective:    Patient ID: Regina Gonzalez, female    DOB: August 11, 1945, 72 y.o.   MRN: [id number]  HPI Pain- pt was seen in ED for facial pain.  ESR and CT were negative for temporal arteritis.  'I was hurting so bad I didn't want to wait'.  Pt reports area behind R eye and through to the back of her head was incredibly pain and the scalp was sensitive to the touch.  Having difficulty raising both shoulders due to pain.  Pt reports pain all over 'that's coming and going'.  'constant drainage'- pt reports constant drainage w/ ongoing hoarseness.  Not taking Zyrtec and/or Flonase.   Review of Systems For ROS see HPI     Objective:   Physical Exam  Constitutional: She is oriented to person, place, and time. She appears well-developed and well-nourished. No distress.  HENT:  Head: Normocephalic and atraumatic.  Right Ear: External ear normal.  Left Ear: External ear normal.  Eyes: Pupils are equal, round, and reactive to light. Conjunctivae and EOM are normal.  Musculoskeletal: She exhibits tenderness (TTP over multiple trigger points).  Neurological: She is alert and oriented to person, place, and time. No cranial nerve deficit or sensory deficit. Coordination normal.  Skin: Skin is warm and dry.  Psychiatric: She has a normal mood and affect. Her behavior is normal. Thought content normal.  Vitals reviewed.         Assessment & Plan:  PND- ongoing issue for pt.  Also the likely cause of her hoarseness.  She is not taking her antihistamine nor her nasal steroid as previously recommended.  Stressed need for both.  Will follow.

## 2018-01-06 NOTE — Patient Instructions (Signed)
Follow up in 3-4 weeks to recheck mood and pain STOP the Citalopram daily START the Duloxetine (Cymbalta) once daily to improve both mood and pain RESTART the Zyrtec and Flonase daily to improve drainage and hoarseness Call with any questions or concerns Hang in there!

## 2018-01-06 NOTE — Assessment & Plan Note (Signed)
New.  Pt has TTP over multiple trigger points.  Recent ER work up (-) for acute issue.  Since she is having pain, depressed mood- will switch to Cymbalta for both mood improvement and pain relief.  Reviewed supportive care and red flags that should prompt return.  Pt expressed understanding and is in agreement w/ plan.

## 2018-01-08 ENCOUNTER — Telehealth: Payer: Self-pay | Admitting: Emergency Medicine

## 2018-01-08 NOTE — Telephone Encounter (Signed)
Spoke with pt and advised that the cymbalta can take a few days to get in her system. Also advised pt that she is to expect some minor muscle discomfort as she begins exercising and that swimming is a great low impact exercise for her.

## 2018-01-08 NOTE — Telephone Encounter (Signed)
Copied from CRM [id number]. Topic: General - Other >> Jan 08, 2018  9:09 AM [name], [name] wrote:  Pt said she was put on DULoxetine (CYMBALTA) 30 MG capsule   and is not having any issues but has questions and would like to speak with the Dr Beverely Lowabori nurse

## 2018-01-19 ENCOUNTER — Telehealth: Payer: Self-pay | Admitting: Emergency Medicine

## 2018-01-19 NOTE — Telephone Encounter (Signed)
Please advise 

## 2018-01-19 NOTE — Telephone Encounter (Signed)
Copied from CRM [phone]#[id number]. Topic: General - Other >> Jan 19, 2018 11:52 AM Wyonia HoughJohnson, Chaz E wrote: Reason for CRM: Pt called to give Dr. Beverely Lowabori a follow up on how she is feeling since her last visit on [phone].  Pt is not feeling much relief and is still experiencing pain on her head, ears and neck from her Fibromyalgia.  Pt stated that she has been taking a couple tylenol a day to help. / please advise if Dr. Beverely Lowabori wants to have her to be seen again or something else to help with relief.

## 2018-01-20 ENCOUNTER — Other Ambulatory Visit: Payer: Self-pay | Admitting: Family Medicine

## 2018-01-20 MED ORDER — DULOXETINE HCL 60 MG PO CPEP: 60.0000 mg | ORAL_CAPSULE | Freq: Every day | ORAL | 3 refills | Status: DC

## 2018-01-20 NOTE — Telephone Encounter (Signed)
Called and advised pt of PCP recommendations. Pt stated an understanding and med was filled to local pharmacy.

## 2018-01-20 NOTE — Telephone Encounter (Signed)
We can increase Cymbalta to 60mg  daily and see if this improves her sxs.  Pt can take 2 of what she has at home and 1 of the new prescription, Cymbalta 60mg , 1 tab daily, #30, 3 refills

## 2018-01-20 NOTE — Addendum Note (Signed)
Addended by: Geannie RisenBRODMERKEL, JESSICA L on: 01/20/2018 10:25 AM   Modules accepted: Orders

## 2018-01-29 NOTE — Progress Notes (Deleted)
Office Visit Note  Patient: Regina Gonzalez             Date of Birth: 16-Apr-1945           MRN: [id number]             PCP: Sheliah Hatchabori, Katherine E, MD Referring: Sheliah Hatchabori, Katherine E, MD Visit Date: 02/11/2018 Occupation: @GUAROCC @  Subjective:  No chief complaint on file.   History of Present Illness: Regina Gonzalez is a 72 y.o. female ***   Activities of Daily Living:  Patient reports morning stiffness for *** {minute/hour:19697}.   Patient {ACTIONS;DENIES/REPORTS:21021675::"Denies"} nocturnal pain.  Difficulty dressing/grooming: {ACTIONS;DENIES/REPORTS:21021675::"Denies"} Difficulty climbing stairs: {ACTIONS;DENIES/REPORTS:21021675::"Denies"} Difficulty getting out of chair: {ACTIONS;DENIES/REPORTS:21021675::"Denies"} Difficulty using hands for taps, buttons, cutlery, and/or writing: {ACTIONS;DENIES/REPORTS:21021675::"Denies"}  No Rheumatology ROS completed.   PMFS History:  Patient Active Problem List   Diagnosis Date Noted  . Fibromyalgia 01/06/2018  . Gout 07/07/2017  . Moderate episode of recurrent major depressive disorder (HCC) 06/19/2017  . Female bladder prolapse 03/15/2015  . Trapezius muscle spasm 12/04/2014  . Lateral epicondylitis of right elbow 12/04/2014  . History of tobacco abuse 09/15/2014  . Memory loss 05/11/2014  . Postmenopausal bleeding 07/08/2013  . External hemorrhoid, bleeding 04/29/2013  . Osteoporosis 07/05/2012  . TIA (transient ischemic attack) 07/05/2012  . Routine general medical examination at a health care facility 07/05/2012  . Hypopigmentation 09/01/2010  . GERD 04/15/2010  . IRRITABLE BOWEL SYNDROME 04/15/2010  . FLATULENCE ERUCTATION AND GAS PAIN 04/15/2010  . SHORTNESS OF BREATH 02/20/2010  . HELICOBACTER PYLORI GASTRITIS, HX OF 01/10/2010  . Hyperlipidemia 12/06/2009  . ANEMIA-NOS 12/06/2009  . Depression 12/06/2009  . ALLERGIC RHINITIS 12/06/2009  . BARRETTS ESOPHAGUS 12/06/2009    Past Medical History:  Diagnosis  Date  . Allergic rhinitis   . Anxiety   . Arthritis    HANDS  . Benign liver cyst    multiple  . Chronic constipation   . Depression   . Diverticulosis of colon   . Emphysematous COPD (HCC)   . External hemorrhoid   . GERD (gastroesophageal reflux disease)   . History of colon polyps   . History of Helicobacter pylori infection   . History of TIA (transient ischemic attack)    apr 2013-- no residual  . Hyperlipidemia   . Occipital neuralgia of left side 12/08/2014  . Osteoporosis   . Pancreatic cyst    simple benign  . PONV (postoperative nausea and vomiting)   . Prolapsed internal hemorrhoids, grade 3   . Simple renal cyst    right   . Wears glasses   . Wears partial dentures    UPPER    Family History  Problem Relation Age of Onset  . Alcoholism Father   . Diabetes type II Brother   . Heart attack Brother   . Cirrhosis Mother        non-alcoholic  . Multiple sclerosis Daughter   . Pancreatitis Daughter   . Colon cancer Neg Hx    Past Surgical History:  Procedure Laterality Date  . APPENDECTOMY  1960  . COLONOSCOPY WITH PROPOFOL  last one 10-25-2013   polypectomy  . D & C HYSTEROSCOPY W/ ENDOMETRIAL POLYPECTOMY    . ESOPHAGOGASTRODUODENOSCOPY  last one 03-04-2013  . EXCISION BILATERAL BREAST CYST  1995  . HEMORRHOID SURGERY N/A 04/06/2014   Procedure: Hemmorhoidopexy with anal canal biopsy;  Surgeon: Romie LeveeAlicia Thomas, MD;  [facility];  Service: General;  Laterality: N/A;  .  LAPAROSCOPIC RIGHT OVARIAN CYSTECTOMY/  D & C HYSTEROSCOPY ENDOMETRIAL POLYPECTOMY  2011  . TONSILLECTOMY AND ADENOIDECTOMY  1955  . TRANSTHORACIC ECHOCARDIOGRAM  08-13-2012   normal LV/  ef 60-65%/  mild AR/  trivial MR   Social History   Social History Narrative   Patient lives at home with her grandson.  Husband Channing Mutters(Roy) died 2019 as did her daughter Sharyl NimrodMeredith.  Works for [employer]. Patient has high school education and two children (1 deceased).  Caffeine 2-3 cups daily.    Objective: Vital Signs: LMP 06/16/1997    Physical Exam   Musculoskeletal Exam: ***  CDAI Exam: CDAI Score: Not documented Patient Global Assessment: Not documented; Provider Global Assessment: Not documented Swollen: Not documented; Tender: Not documented Joint Exam   Not documented   There is currently no information documented on the homunculus. Go to the Rheumatology activity and complete the homunculus joint exam.  Investigation: No additional findings.  Imaging: Ct Head Wo Contrast  Result Date: 01/03/2018 CLINICAL DATA:  Right-sided headache and facial pain intermittently for 2 months which is radiating into the back of the neck today. Light sensitivity. EXAM: CT HEAD WITHOUT CONTRAST TECHNIQUE: Contiguous axial images were obtained from the base of the skull through the vertex without intravenous contrast. COMPARISON:  07/07/2014 FINDINGS: Brain: There is no evidence of acute infarct, intracranial hemorrhage, mass, midline shift, or extra-axial fluid collection. The ventricles and sulci are normal. Vascular: No hyperdense vessel. Skull: No fracture or focal osseous lesion. Sinuses/Orbits: Visualized paranasal sinuses and mastoid air cells are clear. Orbits are unremarkable. Other: None. IMPRESSION: Negative head CT. Electronically Signed   By: Sebastian AcheAllen  Grady M.D.   On: 01/03/2018 09:47    Recent Labs: Lab Results  Component Value Date   WBC 4.1 09/10/2017   HGB 13.9 09/10/2017   PLT 231.0 09/10/2017   NA 138 09/10/2017   K 4.0 09/10/2017   CL 101 09/10/2017   CO2 32 09/10/2017   GLUCOSE 90 09/10/2017   BUN 11 09/10/2017   CREATININE 0.97 09/10/2017   BILITOT 0.4 09/10/2017   ALKPHOS 51 09/10/2017   AST 18 09/10/2017   ALT 15 09/10/2017   PROT 6.4 09/10/2017   ALBUMIN 4.4 09/10/2017   CALCIUM 9.6 09/10/2017   GFRAA 81 (L) 06/01/2012    Speciality Comments: No specialty comments available.  Procedures:  No procedures  performed Allergies: Erythromycin and Flagyl [metronidazole]   Assessment / Plan:     Visit Diagnoses: No diagnosis found.   Orders: No orders of the defined types were placed in this encounter.  No orders of the defined types were placed in this encounter.   Face-to-face time spent with patient was *** minutes. Greater than 50% of time was spent in counseling and coordination of care.  Follow-Up Instructions: No follow-ups on file.   Ellen HenriMarissa C Neliah Cuyler, CMA  Note - This record has been created using Animal nutritionistDragon software.  Chart creation errors have been sought, but may not always  have been located. Such creation errors do not reflect on  the standard of medical care.

## 2018-02-10 ENCOUNTER — Other Ambulatory Visit: Payer: Self-pay

## 2018-02-10 ENCOUNTER — Ambulatory Visit (INDEPENDENT_AMBULATORY_CARE_PROVIDER_SITE_OTHER): Payer: Medicare HMO | Admitting: Family Medicine

## 2018-02-10 ENCOUNTER — Encounter: Payer: Self-pay | Admitting: Family Medicine

## 2018-02-10 VITALS — BP 122/83 | HR 66 | Temp 98.0°F | Resp 16 | Ht 61.0 in | Wt 118.4 lb

## 2018-02-10 DIAGNOSIS — F331 Major depressive disorder, recurrent, moderate: Secondary | ICD-10-CM | POA: Diagnosis not present

## 2018-02-10 DIAGNOSIS — R69 Illness, unspecified: Secondary | ICD-10-CM | POA: Diagnosis not present

## 2018-02-10 DIAGNOSIS — E7800 Pure hypercholesterolemia, unspecified: Secondary | ICD-10-CM

## 2018-02-10 DIAGNOSIS — M797 Fibromyalgia: Secondary | ICD-10-CM | POA: Diagnosis not present

## 2018-02-10 LAB — HEPATIC FUNCTION PANEL
ALT: 17 U/L (ref 0–35)
AST: 24 U/L (ref 0–37)
Albumin: 4.2 g/dL (ref 3.5–5.2)
Alkaline Phosphatase: 51 U/L (ref 39–117)
Bilirubin, Direct: 0.1 mg/dL (ref 0.0–0.3)
Total Bilirubin: 0.4 mg/dL (ref 0.2–1.2)
Total Protein: 6.5 g/dL (ref 6.0–8.3)

## 2018-02-10 LAB — BASIC METABOLIC PANEL
BUN: 11 mg/dL (ref 6–23)
CO2: 31 mEq/L (ref 19–32)
Calcium: 9.9 mg/dL (ref 8.4–10.5)
Chloride: 102 mEq/L (ref 96–112)
Creatinine, Ser: 0.88 mg/dL (ref 0.40–1.20)
GFR: 67.41 mL/min (ref >60.00)
Glucose, Bld: 86 mg/dL (ref 70–99)
Potassium: 3.7 mEq/L (ref 3.5–5.1)
Sodium: 139 mEq/L (ref 135–145)

## 2018-02-10 LAB — LIPID PANEL
Cholesterol: 171 mg/dL (ref 0–200)
HDL: 83.9 mg/dL (ref >39.00)
LDL Cholesterol: 76 mg/dL (ref 0–99)
NonHDL: 87.55
Total CHOL/HDL Ratio: 2
Triglycerides: 59 mg/dL (ref 0.0–149.0)
VLDL: 11.8 mg/dL (ref 0.0–40.0)

## 2018-02-10 NOTE — Assessment & Plan Note (Signed)
Improved since switching to Cymbalta.  Pt cancelled her rheumatology appt.  Will continue to follow.

## 2018-02-10 NOTE — Progress Notes (Signed)
   Subjective:    Patient ID: Regina Gonzalez, female    DOB: 16-Jul-1945, 73 y.o.   MRN: [id number]  HPI Fibromyalgia- pt's Citalopram was switched to Cymbalta 60mg  at last visit.  PHQ9 has improved from 8-->1.  'thank you for the medicine, I think it has helped tremendously'.  Pain is better, sleeping well, mood is good.  Continues to have some neck and shoulder pain/tightness but she is not interested in muscle relaxers at this time.  Hyperlipidemia- ongoing issue.  Started Simvastatin again.  Denies CP, SOB, abd pain, N/V.   Review of Systems For ROS see HPI     Objective:   Physical Exam Vitals signs reviewed.  Constitutional:      General: She is not in acute distress.    Appearance: Normal appearance.  HENT:     Head: Normocephalic and atraumatic.  Neurological:     General: No focal deficit present.     Mental Status: She is alert and oriented to person, place, and time.  Psychiatric:        Mood and Affect: Mood normal.        Behavior: Behavior normal.        Thought Content: Thought content normal.           Assessment & Plan:

## 2018-02-10 NOTE — Assessment & Plan Note (Addendum)
Improving w/ switch from Citalopram to Cymbalta.  No med changes at this time.  Will follow.

## 2018-02-10 NOTE — Patient Instructions (Addendum)
Schedule your complete physical for August We'll notify you of your lab results and make any changes if needed No med changes at this time Continue to exercise regularly for pain relief If you change your mind on a muscle relaxer, let me know! Call with any questions or concerns Happy New Year!!

## 2018-02-10 NOTE — Assessment & Plan Note (Signed)
Chronic problem.  Pt has restarted her statin and is interested in labs today.  Encouraged healthy diet and regular exercise in addition to her medication.  Will follow.

## 2018-02-11 ENCOUNTER — Ambulatory Visit: Payer: Medicare HMO | Admitting: Rheumatology

## 2018-02-11 ENCOUNTER — Encounter: Payer: Self-pay | Admitting: General Practice

## 2018-02-12 ENCOUNTER — Other Ambulatory Visit: Payer: Self-pay | Admitting: Family Medicine

## 2018-03-08 DIAGNOSIS — R143 Flatulence: Secondary | ICD-10-CM | POA: Diagnosis not present

## 2018-03-08 DIAGNOSIS — K581 Irritable bowel syndrome with constipation: Secondary | ICD-10-CM | POA: Diagnosis not present

## 2018-03-08 DIAGNOSIS — K219 Gastro-esophageal reflux disease without esophagitis: Secondary | ICD-10-CM | POA: Diagnosis not present

## 2018-03-10 ENCOUNTER — Other Ambulatory Visit: Payer: Self-pay | Admitting: Family Medicine

## 2018-03-10 DIAGNOSIS — Z1231 Encounter for screening mammogram for malignant neoplasm of breast: Secondary | ICD-10-CM

## 2018-03-31 ENCOUNTER — Other Ambulatory Visit: Payer: Self-pay | Admitting: Family Medicine

## 2018-04-07 ENCOUNTER — Ambulatory Visit
Admission: RE | Admit: 2018-04-07 | Discharge: 2018-04-07 | Disposition: A | Discharge status: Home / Self Care | Payer: Medicare HMO | Source: Ambulatory Visit | Attending: Family Medicine | Admitting: Family Medicine

## 2018-04-07 DIAGNOSIS — Z1231 Encounter for screening mammogram for malignant neoplasm of breast: Secondary | ICD-10-CM | POA: Diagnosis not present

## 2018-04-24 ENCOUNTER — Other Ambulatory Visit: Payer: Self-pay | Admitting: Family Medicine

## 2018-05-10 ENCOUNTER — Other Ambulatory Visit: Payer: Self-pay | Admitting: Family Medicine

## 2018-05-10 ENCOUNTER — Other Ambulatory Visit: Payer: Self-pay

## 2018-05-10 MED ORDER — DICYCLOMINE HCL 20 MG PO TABS: ORAL_TABLET | ORAL | 1 refills | Status: DC

## 2018-05-19 ENCOUNTER — Other Ambulatory Visit: Payer: Self-pay | Admitting: Family Medicine

## 2018-06-11 ENCOUNTER — Other Ambulatory Visit: Payer: Self-pay | Admitting: Family Medicine

## 2018-06-14 ENCOUNTER — Other Ambulatory Visit: Payer: Self-pay | Admitting: Family Medicine

## 2018-06-14 NOTE — Telephone Encounter (Signed)
Last OV 02/10/18 Alprazolam last filled 12/15/17 #60 with 3

## 2018-06-15 ENCOUNTER — Telehealth: Payer: Self-pay | Admitting: General Practice

## 2018-06-15 NOTE — Telephone Encounter (Signed)
Received a PA request from pts insurance company. States that pt exceeds daily qty limitation for medication. Per SIG pt takes 1-2 tablets by mouth 3 times daily prn.   Per insurance pt cannot exceed 4 tablets daily.  Please advise.

## 2018-06-16 MED ORDER — ALPRAZOLAM 1 MG PO TABS: ORAL_TABLET | ORAL | 1 refills | Status: DC

## 2018-06-16 NOTE — Telephone Encounter (Signed)
Please let pt know that I INCREASED her dose from 0.5mg  --> 1mg  so she should start w/ 1/2 tab and only take 1 tab if needed.

## 2018-06-16 NOTE — Telephone Encounter (Signed)
Called and informed pt of the dose change and why we had to do this. She stated an understanding.

## 2018-06-16 NOTE — Addendum Note (Signed)
Addended by: Sheliah Hatch on: 06/16/2018 08:28 AM   Modules accepted: Orders

## 2018-07-09 ENCOUNTER — Ambulatory Visit: Payer: Self-pay | Admitting: *Deleted

## 2018-07-09 NOTE — Telephone Encounter (Signed)
FYI

## 2018-07-09 NOTE — Telephone Encounter (Signed)
Patient is calling to repot she is not sleeping well and she feels she is fatigued. Patient wants to discuss her sleep with PCP.  Reason for Disposition . [1] Insomnia persists > 1 week AND [2] no improvement after using CARE ADVICE  Answer Assessment - Initial Assessment Questions 1. DESCRIPTION: "Tell me about your sleeping problem."      Patient is having trouble sleeping- she is waking up 2. ONSET: "How long have you been having trouble sleeping?" (e.g., days, weeks, months)     Patient has always had sleep problems- now it is worse 3. RECURRENT: "Have you had sleeping problems before?"  If yes: "What happened that time?" "What helped your sleeping problem go away in the past?"      Yes- she does use medication-she does take xanax 4. STRESS: "Is there anything in your life that is making you feel stressed or tense?"     Patient has stressors- some personal and outside stressors 5. PAIN: "Do you have any pain that is keeping you awake?" (e.g., back pain, headache, abdominal pain)     No pain 6. CAFFEINE ABUSE: "Do you drink caffeinated beverages, and how much each day?" (e.g., coffee, tea, colas)     2 cups coffee/day 7. SUBSTANCE ABUSE: "Do you use any illegal drugs or alcohol?"     no 8. OTHER SYMPTOMS: "Do you have any other symptoms?"  (e.g., difficulty breathing)     no  Protocols used: INSOMNIA-A-AH

## 2018-07-09 NOTE — Telephone Encounter (Signed)
Pt is scheduled 07/13/18 for a VV

## 2018-07-13 ENCOUNTER — Encounter: Payer: Self-pay | Admitting: Family Medicine

## 2018-07-13 ENCOUNTER — Other Ambulatory Visit: Payer: Self-pay

## 2018-07-13 ENCOUNTER — Ambulatory Visit (INDEPENDENT_AMBULATORY_CARE_PROVIDER_SITE_OTHER): Payer: Medicare HMO | Admitting: Family Medicine

## 2018-07-13 VITALS — Temp 96.3°F | Wt 117.5 lb

## 2018-07-13 DIAGNOSIS — K5909 Other constipation: Secondary | ICD-10-CM | POA: Diagnosis not present

## 2018-07-13 DIAGNOSIS — G4700 Insomnia, unspecified: Secondary | ICD-10-CM

## 2018-07-13 NOTE — Progress Notes (Signed)
I have discussed the procedure for the virtual visit with the patient who has given consent to proceed with assessment and treatment.   Regina Gonzalez states that she began taking a full Alprazolam the last 3 nights and has been able to sleep.   Davis Gourd, CMA

## 2018-07-13 NOTE — Progress Notes (Signed)
Virtual Visit via Video   I connected with patient on 07/13/18 at  9:30 AM EDT by a video enabled telemedicine application and verified that I am speaking with the correct person using two identifiers.  Location patient: Home Location provider: [facility], [facility] Persons participating in the virtual visit: Patient, Provider, CMA (Jess B)  I discussed the limitations of evaluation and management by telemedicine and the availability of in person appointments. The patient expressed understanding and agreed to proceed.  Subjective:   HPI:   Insomnia- chronic problem.  Pt reports she was having difficulty sleeping.  She increased her Alprazolam to 1 tab nightly and she has been able to sleep well for the last 3 nights.    Constipation- ongoing issue.  Did well w/ Linzess samples from GI but ran out and was not able to afford prescription.  Taking probiotics and stool softeners.  Not taking Miralax regularly  ROS:   See pertinent positives and negatives per HPI.  Patient Active Problem List   Diagnosis Date Noted  . Fibromyalgia 01/06/2018  . Gout 07/07/2017  . Moderate episode of recurrent major depressive disorder (HCC) 06/19/2017  . Female bladder prolapse 03/15/2015  . Trapezius muscle spasm 12/04/2014  . Lateral epicondylitis of right elbow 12/04/2014  . History of tobacco abuse 09/15/2014  . Memory loss 05/11/2014  . Postmenopausal bleeding 07/08/2013  . External hemorrhoid, bleeding 04/29/2013  . Osteoporosis 07/05/2012  . TIA (transient ischemic attack) 07/05/2012  . Routine general medical examination at a health care facility 07/05/2012  . Hypopigmentation 09/01/2010  . GERD 04/15/2010  . IRRITABLE BOWEL SYNDROME 04/15/2010  . FLATULENCE ERUCTATION AND GAS PAIN 04/15/2010  . SHORTNESS OF BREATH 02/20/2010  . HELICOBACTER PYLORI GASTRITIS, HX OF 01/10/2010  . Hyperlipidemia 12/06/2009  . ANEMIA-NOS 12/06/2009  . Depression 12/06/2009  . ALLERGIC RHINITIS  12/06/2009  . BARRETTS ESOPHAGUS 12/06/2009    Social History   Tobacco Use  . Smoking status: Former Smoker    Packs/day: 1.00    Years: 17.00    Pack years: 17.00    Types: Cigarettes    Last attempt to quit: 04/02/1988    Years since quitting: 30.2  . Smokeless tobacco: Never Used  Substance Use Topics  . Alcohol use: No    Current Outpatient Medications:  .  ALPRAZolam (XANAX) 1 MG tablet, 1/2-1 tab 2-3x daily prn anxiety, Disp: 60 tablet, Rfl: 1 .  Ascorbic Acid (VITAMIN C PO), Take by mouth daily., Disp: , Rfl:  .  Calcium Carb-Cholecalciferol (CALCIUM 600 + D PO), Take 2 tablets by mouth daily., Disp: , Rfl:  .  cyanocobalamin 500 MCG tablet, Take 500 mcg by mouth daily., Disp: , Rfl:  .  dicyclomine (BENTYL) 20 MG tablet, TAKE ONE TABLET BY MOUTH THREE TIMES DAILY BETWEEN MEALS AS NEEDED., Disp: 270 tablet, Rfl: 1 .  Docusate Calcium (STOOL SOFTENER PO), Take by mouth., Disp: , Rfl:  .  DULoxetine (CYMBALTA) 60 MG capsule, TAKE 1 CAPSULE BY MOUTH EVERY DAY, Disp: 90 capsule, Rfl: 2 .  ferrous sulfate 325 (65 FE) MG tablet, TAKE 1 TABLET (325 MG TOTAL) BY MOUTH DAILY WITH BREAKFAST., Disp: 30 tablet, Rfl: 2 .  fluticasone (FLONASE) 50 MCG/ACT nasal spray, Place 2 sprays into both nostrils daily., Disp: 16 g, Rfl: 6 .  loratadine (CLARITIN) 10 MG tablet, Take 1 tablet (10 mg total) by mouth daily., Disp: 30 tablet, Rfl: 11 .  Multiple Vitamins-Minerals (MULTIVITAMIN WITH MINERALS) tablet, Take 1 tablet by mouth  daily. , Disp: , Rfl:  .  pantoprazole (PROTONIX) 40 MG tablet, TAKE 1 TABLET BY MOUTH EVERY DAY, Disp: 90 tablet, Rfl: 2 .  simvastatin (ZOCOR) 20 MG tablet, TAKE 1 TABLET BY MOUTH EVERYDAY AT BEDTIME, Disp: 90 tablet, Rfl: 1  Allergies  Allergen Reactions  . Erythromycin Other (See Comments)    "severe GI upset, also all "mycins" meds   . Flagyl [Metronidazole] Other (See Comments)    "flu-like symptoms"    Objective:   Temp (!) 96.3 F (35.7 C) (Oral)   Wt  117 lb 8 oz (53.3 kg)   LMP 06/16/1997   BMI 22.20 kg/m   AAOx3, NAD NCAT, EOMI No obvious CN deficits Coloring WNL Pt is able to speak clearly, coherently without shortness of breath or increased work of breathing.  Thought process is linear.  Mood is appropriate.   Assessment and Plan:   Insomnia- pt was able to self correct by increasing her Alprazolam to 1 tab nightly.  She is now sleeping better and feeling more rested during the days which allows her to get more done.  Will continue to follow.  Constipation- chronic problem.  Has seen GI.  Was doing well on Linzess samples but ran out and GI did not have any.  Encouraged her to inquire about pt assistance b/c the out of pocket cost was well over $100.  Pt to start daily Miralax until she is able to touch base w/ GI.  Pt expressed understanding and is in agreement w/ plan.    Annye Asa, MD 07/13/2018

## 2018-08-04 DIAGNOSIS — K581 Irritable bowel syndrome with constipation: Secondary | ICD-10-CM | POA: Diagnosis not present

## 2018-08-04 DIAGNOSIS — K219 Gastro-esophageal reflux disease without esophagitis: Secondary | ICD-10-CM | POA: Diagnosis not present

## 2018-08-17 ENCOUNTER — Other Ambulatory Visit: Payer: Self-pay | Admitting: Family Medicine

## 2018-08-26 ENCOUNTER — Other Ambulatory Visit: Payer: Self-pay | Admitting: Family Medicine

## 2018-09-09 ENCOUNTER — Ambulatory Visit: Payer: Self-pay | Admitting: *Deleted

## 2018-09-09 NOTE — Telephone Encounter (Signed)
Patient is out of town at [place]- she is having tingling in her left arm and has noticed some weakness- almost like swelling in lower lid of her left eye. Due to symptoms- patient advised this falls into 911 category and she needs to be seen quickly- she is hesitant to go to ED- she agrees to go. Reason for Disposition . [1] Numbness (i.e., loss of sensation) of the face, arm / hand, or leg / foot on one side of the body AND [2] sudden onset AND [3] present now  Answer Assessment - Initial Assessment Questions 1. SYMPTOM: "What is the main symptom you are concerned about?" (e.g., weakness, numbness)     Changes in left arm and eye 2. ONSET: "When did this start?" (minutes, hours, days; while sleeping)     today 3. LAST NORMAL: "When was the last time you were normal (no symptoms)?"     yesterday 4. PATTERN "Does this come and go, or has it been constant since it started?"  "Is it present now?"     Constant since this morning- yes 5. CARDIAC SYMPTOMS: "Have you had any of the following symptoms: chest pain, difficulty breathing, palpitations?"     Yes in the past-patient has had some this past week 6. NEUROLOGIC SYMPTOMS: "Have you had any of the following symptoms: headache, dizziness, vision loss, double vision, changes in speech, unsteady on your feet?"     Some headache 7. OTHER SYMPTOMS: "Do you have any other symptoms?"     158/91- wrist cuff 8. PREGNANCY: "Is there any chance you are pregnant?" "When was your last menstrual period?"     n/a  Protocols used: NEUROLOGIC DEFICIT-A-AH

## 2018-09-14 ENCOUNTER — Other Ambulatory Visit: Payer: Self-pay

## 2018-09-14 ENCOUNTER — Ambulatory Visit: Payer: Medicare HMO | Admitting: Physician Assistant

## 2018-09-14 ENCOUNTER — Encounter: Payer: Self-pay | Admitting: Physician Assistant

## 2018-09-14 NOTE — Progress Notes (Signed)
I have discussed the procedure for the virtual visit with the patient who has given consent to proceed with assessment and treatment.   Leonidas Romberg, CMA   Spoke with patient in detailed. Patient is not currently having symptoms. Due to the difficulties with getting the patient to access Video visit. Patient has been rescheduled to tomorrow for evaluation in the office.  Patient advised if symptoms returned or worsens to be evaluated in the ER. Patient is understandable.

## 2018-09-14 NOTE — Progress Notes (Signed)
Erroneous encounter

## 2018-09-14 NOTE — Progress Notes (Deleted)
Virtual Visit via Video   I connected with patient on 09/14/18 at  3:00 PM EDT by a video enabled telemedicine application and verified that I am speaking with the correct person using two identifiers.  Location patient: Home Location provider: [facility] Persons participating in the virtual visit: Patient, Provider, Bentley (Patina Moore)  I discussed the limitations of evaluation and management by telemedicine and the availability of in person appointments. The patient expressed understanding and agreed to proceed.  Subjective:   HPI:   Patient presents via [employer] today ***.   ROS:   See pertinent positives and negatives per HPI.  Patient Active Problem List   Diagnosis Date Noted  . Fibromyalgia 01/06/2018  . Gout 07/07/2017  . Moderate episode of recurrent major depressive disorder ([place]) 06/19/2017  . Female bladder prolapse 03/15/2015  . Trapezius muscle spasm 12/04/2014  . Lateral epicondylitis of right elbow 12/04/2014  . History of tobacco abuse 09/15/2014  . Memory loss 05/11/2014  . Postmenopausal bleeding 07/08/2013  . External hemorrhoid, bleeding 04/29/2013  . Osteoporosis 07/05/2012  . TIA (transient ischemic attack) 07/05/2012  . Routine general medical examination at a health care facility 07/05/2012  . Hypopigmentation 09/01/2010  . GERD 04/15/2010  . IRRITABLE BOWEL SYNDROME 04/15/2010  . FLATULENCE ERUCTATION AND GAS PAIN 04/15/2010  . SHORTNESS OF BREATH 02/20/2010  . HELICOBACTER PYLORI GASTRITIS, HX OF 01/10/2010  . Hyperlipidemia 12/06/2009  . ANEMIA-NOS 12/06/2009  . Depression 12/06/2009  . ALLERGIC RHINITIS 12/06/2009  . BARRETTS ESOPHAGUS 12/06/2009    Social History   Tobacco Use  . Smoking status: Former Smoker    Packs/day: 1.00    Years: 17.00    Pack years: 17.00    Types: Cigarettes    Quit date: 04/02/1988    Years since quitting: 30.4  . Smokeless tobacco: Never Used  Substance Use Topics  . Alcohol use:  No    Current Outpatient Medications:  .  ALPRAZolam (XANAX) 1 MG tablet, 1/2-1 tab 2-3x daily prn anxiety, Disp: 60 tablet, Rfl: 1 .  Ascorbic Acid (VITAMIN C PO), Take by mouth daily., Disp: , Rfl:  .  Calcium Carb-Cholecalciferol (CALCIUM 600 + D PO), Take 2 tablets by mouth daily., Disp: , Rfl:  .  cyanocobalamin 500 MCG tablet, Take 500 mcg by mouth daily., Disp: , Rfl:  .  dicyclomine (BENTYL) 20 MG tablet, TAKE ONE TABLET BY MOUTH THREE TIMES DAILY BETWEEN MEALS AS NEEDED., Disp: 270 tablet, Rfl: 1 .  Docusate Calcium (STOOL SOFTENER PO), Take by mouth., Disp: , Rfl:  .  DULoxetine (CYMBALTA) 60 MG capsule, TAKE 1 CAPSULE BY MOUTH EVERY DAY, Disp: 90 capsule, Rfl: 1 .  ferrous sulfate 325 (65 FE) MG tablet, TAKE 1 TABLET (325 MG TOTAL) BY MOUTH DAILY WITH BREAKFAST., Disp: 30 tablet, Rfl: 2 .  fluticasone (FLONASE) 50 MCG/ACT nasal spray, Place 2 sprays into both nostrils daily., Disp: 16 g, Rfl: 6 .  loratadine (CLARITIN) 10 MG tablet, Take 1 tablet (10 mg total) by mouth daily., Disp: 30 tablet, Rfl: 11 .  Multiple Vitamins-Minerals (MULTIVITAMIN WITH MINERALS) tablet, Take 1 tablet by mouth daily. , Disp: , Rfl:  .  pantoprazole (PROTONIX) 40 MG tablet, TAKE 1 TABLET BY MOUTH EVERY DAY, Disp: 90 tablet, Rfl: 2 .  simvastatin (ZOCOR) 20 MG tablet, TAKE 1 TABLET BY MOUTH EVERYDAY AT BEDTIME, Disp: 90 tablet, Rfl: 1  Allergies  Allergen Reactions  . Erythromycin Other (See Comments)    "severe GI upset, also  all "mycins" meds   . Flagyl [Metronidazole] Other (See Comments)    "flu-like symptoms"    Objective:   BP (!) 128/93   Pulse 72   Temp (!) 97.4 F (36.3 C) (Oral)   Wt 117 lb 12.8 oz (53.4 kg)   LMP 06/16/1997   BMI 22.26 kg/m   Patient is well-developed, well-nourished in no acute distress.  Resting comfortably at home.  Head is normocephalic, atraumatic.  No labored breathing.  Speech is clear and coherent with logical content.  Patient is alert and oriented  at baseline.  ***  Assessment and Plan:   ***.   Piedad ClimesWilliam Cody Dorrien Grunder, PA-C 09/14/2018

## 2018-09-15 ENCOUNTER — Telehealth: Payer: Self-pay | Admitting: Physician Assistant

## 2018-09-15 ENCOUNTER — Other Ambulatory Visit: Payer: Self-pay

## 2018-09-15 ENCOUNTER — Ambulatory Visit (INDEPENDENT_AMBULATORY_CARE_PROVIDER_SITE_OTHER): Payer: Medicare HMO | Admitting: Physician Assistant

## 2018-09-15 ENCOUNTER — Encounter: Payer: Self-pay | Admitting: Physician Assistant

## 2018-09-15 VITALS — BP 154/60 | HR 62 | Temp 97.4°F | Resp 16 | Ht 61.0 in | Wt 117.0 lb

## 2018-09-15 DIAGNOSIS — I10 Essential (primary) hypertension: Secondary | ICD-10-CM

## 2018-09-15 DIAGNOSIS — K219 Gastro-esophageal reflux disease without esophagitis: Secondary | ICD-10-CM | POA: Diagnosis not present

## 2018-09-15 DIAGNOSIS — R69 Illness, unspecified: Secondary | ICD-10-CM | POA: Diagnosis not present

## 2018-09-15 DIAGNOSIS — R202 Paresthesia of skin: Secondary | ICD-10-CM

## 2018-09-15 DIAGNOSIS — R9431 Abnormal electrocardiogram [ECG] [EKG]: Secondary | ICD-10-CM

## 2018-09-15 DIAGNOSIS — R002 Palpitations: Secondary | ICD-10-CM | POA: Diagnosis not present

## 2018-09-15 DIAGNOSIS — R0789 Other chest pain: Secondary | ICD-10-CM

## 2018-09-15 DIAGNOSIS — M67912 Unspecified disorder of synovium and tendon, left shoulder: Secondary | ICD-10-CM | POA: Diagnosis not present

## 2018-09-15 LAB — CBC WITH DIFFERENTIAL/PLATELET
Basophils Absolute: 0 10*3/uL (ref 0.0–0.1)
Basophils Relative: 1 % (ref 0.0–3.0)
Eosinophils Absolute: 0.2 10*3/uL (ref 0.0–0.7)
Eosinophils Relative: 3.6 % (ref 0.0–5.0)
HCT: 40.2 % (ref 36.0–46.0)
Hemoglobin: 13.3 g/dL (ref 12.0–15.0)
Lymphocytes Relative: 41.6 % (ref 12.0–46.0)
Lymphs Abs: 1.9 10*3/uL (ref 0.7–4.0)
MCHC: 33 g/dL (ref 30.0–36.0)
MCV: 90.7 fl (ref 78.0–100.0)
Monocytes Absolute: 0.4 10*3/uL (ref 0.1–1.0)
Monocytes Relative: 9.3 % (ref 3.0–12.0)
Neutro Abs: 2 10*3/uL (ref 1.4–7.7)
Neutrophils Relative %: 44.5 % (ref 43.0–77.0)
Platelets: 247 10*3/uL (ref 150.0–400.0)
RBC: 4.44 Mil/uL (ref 3.87–5.11)
RDW: 14.3 % (ref 11.5–15.5)
WBC: 4.5 10*3/uL (ref 4.0–10.5)

## 2018-09-15 LAB — COMPREHENSIVE METABOLIC PANEL
ALT: 15 U/L (ref 0–35)
AST: 20 U/L (ref 0–37)
Albumin: 4.6 g/dL (ref 3.5–5.2)
Alkaline Phosphatase: 60 U/L (ref 39–117)
BUN: 10 mg/dL (ref 6–23)
CO2: 31 mEq/L (ref 19–32)
Calcium: 9.9 mg/dL (ref 8.4–10.5)
Chloride: 103 mEq/L (ref 96–112)
Creatinine, Ser: 0.89 mg/dL (ref 0.40–1.20)
GFR: 62.5 mL/min (ref >60.00)
Glucose, Bld: 65 mg/dL — ABNORMAL LOW (ref 70–99)
Potassium: 3.9 mEq/L (ref 3.5–5.1)
Sodium: 142 mEq/L (ref 135–145)
Total Bilirubin: 0.4 mg/dL (ref 0.2–1.2)
Total Protein: 6.6 g/dL (ref 6.0–8.3)

## 2018-09-15 LAB — LIPASE: Lipase: 58 U/L (ref 11.0–59.0)

## 2018-09-15 LAB — TSH: TSH: 2.19 u[IU]/mL (ref 0.35–4.50)

## 2018-09-15 LAB — H. PYLORI ANTIBODY, IGG: H Pylori IgG: NEGATIVE

## 2018-09-15 MED ORDER — LOSARTAN POTASSIUM 25 MG PO TABS: 25.0000 mg | ORAL_TABLET | Freq: Every day | ORAL | 1 refills | Status: DC

## 2018-09-15 NOTE — Patient Instructions (Signed)
Please go to the lab today for blood work.  I will call you with your results. We will alter treatment regimen(s) if indicated by your results.       Your examination looks good today overall. There are no focal neurological deficits on examination so I feel any emergent imaging is unnecessary and not likely to reveal anything. I feel the nerve symptoms are separate issues -- the symptoms of the face seem concerning for some trigeminal neuropathy/neuralgia -- but needs further assessment with neurology. You will be contacted by the specialist to schedule.     Your atypical chest symptoms seem consistent with uncontrolled reflux as you have cut back on your medicine. Restart taking the Protonix every day. No late night eating. See dietary recommendations below. Because of the intermittent palpitations and mild changes on EKG, I am sending you to Cardiology for further assessment of this to make sure there are not multiple things at play here. Please keep a low-salt diet and start the BP medication given.      We will try to get everything done as quick as possible but if you note any acute chest pain, lightheadedness, shortness of breath, sweating or change in level of alertness, focal weakness or change in speech, please call 911.   In regards to your shoulder, avoid heavy lifting, especially overhead. Tylenol if needed for pain. I am having a sports medicine specialist take a further look into this giving the chronicity of symptoms.   Follow-up with myself or Dr. Birdie Riddle in 1 week for reassessment of BP.   Food Choices for Gastroesophageal Reflux Disease, Adult When you have gastroesophageal reflux disease (GERD), the foods you eat and your eating habits are very important. Choosing the right foods can help ease your discomfort. Think about working with a nutrition specialist (dietitian) to help you make good choices. What are tips for following this plan?  Meals  Choose healthy foods that are low  in fat, such as fruits, vegetables, whole grains, low-fat dairy products, and lean meat, fish, and poultry.  Eat small meals often instead of 3 large meals a day. Eat your meals slowly, and in a place where you are relaxed. Avoid bending over or lying down until 2-3 hours after eating.  Avoid eating meals 2-3 hours before bed.  Avoid drinking a lot of liquid with meals.  Cook foods using methods other than frying. Bake, grill, or broil food instead.  Avoid or limit: ? Chocolate. ? Peppermint or spearmint. ? Alcohol. ? Pepper. ? Black and decaffeinated coffee. ? Black and decaffeinated tea. ? Bubbly (carbonated) soft drinks. ? Caffeinated energy drinks and soft drinks.  Limit high-fat foods such as: ? Fatty meat or fried foods. ? Whole milk, cream, butter, or ice cream. ? Nuts and nut butters. ? Pastries, donuts, and sweets made with butter or shortening.  Avoid foods that cause symptoms. These foods may be different for everyone. Common foods that cause symptoms include: ? Tomatoes. ? Oranges, lemons, and limes. ? Peppers. ? Spicy food. ? Onions and garlic. ? Vinegar. Lifestyle  Maintain a healthy weight. Ask your doctor what weight is healthy for you. If you need to lose weight, work with your doctor to do so safely.  Exercise for at least 30 minutes for 5 or more days each week, or as told by your doctor.  Wear loose-fitting clothes.  Do not smoke. If you need help quitting, ask your doctor.  Sleep with the head of your bed  higher than your feet. Use a wedge under the mattress or blocks under the bed frame to raise the head of the bed. Summary  When you have gastroesophageal reflux disease (GERD), food and lifestyle choices are very important in easing your symptoms.  Eat small meals often instead of 3 large meals a day. Eat your meals slowly, and in a place where you are relaxed.  Limit high-fat foods such as fatty meat or fried foods.  Avoid bending over or  lying down until 2-3 hours after eating.  Avoid peppermint and spearmint, caffeine, alcohol, and chocolate. This information is not intended to replace advice given to you by your health care provider. Make sure you discuss any questions you have with your health care provider. Document Released: 07/22/2011 Document Revised: 05/13/2018 Document Reviewed: 02/26/2016 Elsevier Patient Education  2020 [employer]

## 2018-09-15 NOTE — Telephone Encounter (Signed)
Advised patient per provider is ok to take at night time with Xanax. She is agreeable with being consistent with taking her medication.

## 2018-09-15 NOTE — Progress Notes (Signed)
Patient presents to clinic today c/o episodes of intermittent tingling sensation of L forehead and L upper eyelid. This has been present for some time and she is unsure of true symptom onset. More frequent over the past 2 weeks. Restarted last Thursday -- Sitting in bed at symptom onset. Notes numbness/tingling sensation of upper eyelid and forehead. Denies change in vision. Denies eye pain during these episodes. No lower facial numbness. Saw dentist this AM for dental abscess and was told symptoms were not related to dental issues. Denies expressive or receptive aphasia. Denies neck pain, numbness , tingling of posterior neck. Does note occasional numbness and tingling in the R arm and shoulder.Denies weakness in the arm. Does note pain in the R shoulder and pain with ROM of R shoulder. Also has chronic fibromyalgia with some trigger point tenderness in the arm.Intermittent headaches but nothing changed or new.  Notes consistent stress. Recently had a nephew that passed so some increase in anxiety.  Some palpitations -- heart flipping -- happening frequently. Denies SOB, LH or dizziness. Denies alcohol consumption.  2 cups coffee per day but no recent changes.  Tries to keep hydrated.     Past Medical History:  Diagnosis Date  . Allergic rhinitis   . Anxiety   . Arthritis    HANDS  . Benign liver cyst    multiple  . Chronic constipation   . Depression   . Diverticulosis of colon   . Emphysematous COPD ([place])   . External hemorrhoid   . GERD (gastroesophageal reflux disease)   . History of colon polyps   . History of Helicobacter pylori infection   . History of TIA (transient ischemic attack)    apr 2013-- no residual  . Hyperlipidemia   . Occipital neuralgia of left side 12/08/2014  . Osteoporosis   . Pancreatic cyst    simple benign  . PONV (postoperative nausea and vomiting)   . Prolapsed internal hemorrhoids, grade 3   . Simple renal cyst    right   . Wears glasses   .  Wears partial dentures    UPPER    Current Outpatient Medications on File Prior to Visit  Medication Sig Dispense Refill  . ALPRAZolam (XANAX) 1 MG tablet 1/2-1 tab 2-3x daily prn anxiety 60 tablet 1  . Ascorbic Acid (VITAMIN C PO) Take by mouth daily.    . Calcium Carb-Cholecalciferol (CALCIUM 600 + D PO) Take 2 tablets by mouth daily.    . cyanocobalamin 500 MCG tablet Take 500 mcg by mouth daily.    Marland Kitchen dicyclomine (BENTYL) 20 MG tablet TAKE ONE TABLET BY MOUTH THREE TIMES DAILY BETWEEN MEALS AS NEEDED. 270 tablet 1  . Docusate Calcium (STOOL SOFTENER PO) Take by mouth.    . DULoxetine (CYMBALTA) 60 MG capsule TAKE 1 CAPSULE BY MOUTH EVERY DAY 90 capsule 1  . ferrous sulfate 325 (65 FE) MG tablet TAKE 1 TABLET (325 MG TOTAL) BY MOUTH DAILY WITH BREAKFAST. 30 tablet 2  . fluticasone (FLONASE) 50 MCG/ACT nasal spray Place 2 sprays into both nostrils daily. 16 g 6  . loratadine (CLARITIN) 10 MG tablet Take 1 tablet (10 mg total) by mouth daily. 30 tablet 11  . Multiple Vitamins-Minerals (MULTIVITAMIN WITH MINERALS) tablet Take 1 tablet by mouth daily.     . pantoprazole (PROTONIX) 40 MG tablet TAKE 1 TABLET BY MOUTH EVERY DAY 90 tablet 2  . simvastatin (ZOCOR) 20 MG tablet TAKE 1 TABLET BY MOUTH EVERYDAY AT BEDTIME 90  tablet 1   No current facility-administered medications on file prior to visit.     Allergies  Allergen Reactions  . Erythromycin Other (See Comments)    "severe GI upset, also all "mycins" meds   . Flagyl [Metronidazole] Other (See Comments)    "flu-like symptoms"    Family History  Problem Relation Age of Onset  . Alcoholism Father   . Diabetes type II Brother   . Heart attack Brother   . Cirrhosis Mother        non-alcoholic  . Multiple sclerosis Daughter   . Pancreatitis Daughter   . Colon cancer Neg Hx     Social History   Socioeconomic History  . Marital status: Widowed    Spouse name: Carloyn Manner  . Number of children: 2  . Years of education: 34  .  Highest education level: Not on file  Occupational History  . 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 Financial resource strain: Not on file  . Food insecurity    Worry: Not on file    Inability: Not on file  . Transportation needs    Medical: Not on file    Non-medical: Not on file  Tobacco Use  . Smoking status: Former Smoker    Packs/day: 1.00    Years: 17.00    Pack years: 17.00    Types: Cigarettes    Quit date: 04/02/1988    Years since quitting: 30.4  . Smokeless tobacco: Never Used  Substance and Sexual Activity  . Alcohol use: No  . Drug use: No  . Sexual activity: Not on file  Lifestyle  . Physical activity    Days per week: Not on file    Minutes per session: Not on file  . Stress: Not on file  Relationships  . Social Herbalist on phone: Not on file    Gets together: Not on file    Attends religious service: Not on file    Active member of club or organization: Not on file    Attends meetings of clubs or organizations: Not on file    Relationship status: Not on file  Other Topics Concern  . Not on file  Social History Narrative   Patient lives at home with her grandson.  Husband Carloyn Manner) died 04/22/17 as did her daughter Ailene Ravel.  Works for [employer]. Patient has high school education and two children (1 deceased). Caffeine 2-3 cups daily.   Review of Systems - See HPI.  All other ROS are negative.  BP (!) 154/60   Pulse 62   Temp (!) 97.4 F (36.3 C) (Skin)   Resp 16   Ht '5\' 1"'$  (1.549 m)   Wt 117 lb (53.1 kg)   LMP 06/16/1997   SpO2 98%   BMI 22.11 kg/m   Physical Exam Vitals signs reviewed.  Constitutional:      Appearance: Normal appearance.  HENT:     Head: Normocephalic and atraumatic.     Jaw: There is normal jaw occlusion. No tenderness or pain on movement.     Right Ear: Tympanic membrane and ear canal normal.     Left Ear: Tympanic membrane and  ear canal normal.     Nose: Nose normal.     Mouth/Throat:     Mouth: Mucous membranes are moist.  Eyes:  General: Lids are normal. No visual field deficit.    Conjunctiva/sclera: Conjunctivae normal.     Pupils: Pupils are equal, round, and reactive to light.  Neck:     Musculoskeletal: Full passive range of motion without pain and neck supple. No neck rigidity.     Thyroid: No thyroid mass, thyromegaly or thyroid tenderness.     Trachea: Trachea normal.  Cardiovascular:     Rate and Rhythm: Normal rate and regular rhythm.     Pulses: Normal pulses.     Heart sounds: Normal heart sounds.  Pulmonary:     Effort: Pulmonary effort is normal.     Breath sounds: Normal breath sounds.  Abdominal:     General: There is no distension.     Palpations: Abdomen is soft.     Tenderness: There is no abdominal tenderness.  Musculoskeletal:     Left shoulder: She exhibits pain. She exhibits normal range of motion, no tenderness, no bony tenderness, no spasm and normal strength.     Cervical back: Normal.     Comments: Pain with internal and external rotation of L shoulder -- mainly posterior. Strength preserved but significant pain with resisted adduction > 90 degrees.   Lymphadenopathy:     Cervical: No cervical adenopathy.  Neurological:     General: No focal deficit present.     Mental Status: She is alert and oriented to person, place, and time. Mental status is at baseline.     Cranial Nerves: No cranial nerve deficit or facial asymmetry.     Sensory: Sensation is intact.     Motor: No weakness or tremor.     Coordination: Coordination is intact. Coordination normal. Finger-Nose-Finger Test and Heel to [facility] Test normal.     Gait: Gait is intact.     Deep Tendon Reflexes:     Reflex Scores:      Patellar reflexes are 2+ on the right side and 2+ on the left side.    Comments: Strength 5/5 of proximal and distal upper and lower extremities.   Psychiatric:        Mood and Affect: Mood  normal.    Assessment/Plan: 1. Paresthesias Seems to only be in v1-v2 distribution of R trigeminal nerve. Has questionable history of TIA some time ago. This does not seem to reflect that but trigeminal neuralgia. Exam unremarkable today. Will set her up with Neurology for further assessment. - Ambulatory referral to Neurology  2. Atypical chest pain Seems to be related to her R shoulder pain -- tendinopathy versus impingement -- and chronic GERD. Will check labs today to further assess.  EKG with NSR but shortened PR interval. Giving palpitations will refer to Cardiology while we work on treating her shoulder. PPI as directed - EKG 12-Lead - CBC with Differential/Platelet - TSH - Comp Met (CMET) - Lipase - H. pylori antibody, IgG  3. Gastroesophageal reflux disease without esophagitis PPI as directed. Dietary handout reviewed.  - H. pylori antibody, IgG  4. Essential hypertension Mild elevation. Start losartan 25 mg daily. DASH diet discussed. Close follow-up scheduled. - losartan (COZAAR) 25 MG tablet; Take 1 tablet (25 mg total) by mouth daily.  Dispense: 30 tablet; Refill: 1  5. Palpitations 6. Shortened PR interval EKG with shortened PR interval. Will refer to Cardiac Electrophysiology. Strict ER precautions reviewed.  - Ambulatory referral to Cardiac Electrophysiology  7. Tendinopathy of left rotator cuff Supportive measures and OTC medications reviewed. No heavy lifting. Trying to avoid NSAIDs due to  GERD. Referral to sports medicine for further assessment and management. - Ambulatory referral to [place], PA-C

## 2018-09-15 NOTE — Telephone Encounter (Signed)
That is ok. Can take the losartan any time of day but needs to be consistent with this.

## 2018-09-15 NOTE — Telephone Encounter (Signed)
Pt called in asking if it is ok to take her BP medication at night with her Xanax. Pt can be reached at the home #

## 2018-09-20 ENCOUNTER — Ambulatory Visit: Payer: Medicare HMO | Admitting: Family Medicine

## 2018-09-20 ENCOUNTER — Other Ambulatory Visit: Payer: Self-pay

## 2018-09-20 ENCOUNTER — Telehealth: Payer: Self-pay

## 2018-09-20 DIAGNOSIS — M7542 Impingement syndrome of left shoulder: Secondary | ICD-10-CM

## 2018-09-20 NOTE — Patient Instructions (Signed)
You have rotator cuff impingement Try to avoid painful activities (overhead activities, lifting with extended arm) as much as possible. Tylenol 500mg  1-2 tabs three times a day as needed for pain. Voltaren gel up to 4 times a day for pain and inflammation. Subacromial injection may be beneficial to help with pain and to decrease inflammation if not improving. Consider physical therapy with transition to home exercise program. Do home exercise program with theraband and scapular stabilization exercises daily 3 sets of 10 once a day. If not improving at follow-up we will consider imaging, injection, physical therapy, and/or nitro patches. Follow up with me in 5-6 weeks.

## 2018-09-20 NOTE — Telephone Encounter (Signed)
Spoke with patient, stated that she thinks the "tipsy" feeling was a mixture of meds, coffee, and little water intake. Patient states that she is feeling better and denied appt. Patient was already scheduled to see Elyn Aquas on 8.21.20. I informed patient to called back if any symptoms returned. Patient agreeable.      Patient Name: Regina Gonzalez Gender: Female DOB: February 04, 1946 Age: 73 Y 21 M 28 D Return Phone Number: [phone] (Primary) Address: City/State/Zip: [place] [address] Client [facility] Night - C Client Site [place] Physician Dimple Nanas- MD Contact Type Call Who Is Calling Patient / Member / Family / Caregiver Call Type Triage / Clinical Relationship To Patient Self Return Phone Number [phone] (Primary) Chief Complaint Dizziness Reason for Call Symptomatic / Request for [place] states saw the Dr who prescribe 80mcc of Losartan, lightheadedness and dizziness after taking it for 3 days and would like to know if she can cut it in half? Translation No Nurse Assessment Nurse: Martyn Ehrich, RN, Felicia Date/Time (Eastern Time): 09/18/2018 3:21:03 PM Confirm and document reason for call. If symptomatic, describe symptoms. ---PT started losartan 3 d ago and onset today she is feeling a little tipsy. She is lightheaded Has the patient had close contact with a person known or suspected to have the novel coronavirus illness OR traveled / lives in area with major community spread (including international travel) in the last 14 days from the onset of symptoms? * If Asymptomatic, screen for exposure and travel within the last 14 days. ---No Does the patient have any new or worsening symptoms? ---Yes Will a triage be completed? ---Yes Related visit to physician within the last 2 weeks? ---Yes Does the PT have any chronic conditions? (i.e. diabetes, asthma, this  includes High risk factors for pregnancy, etc.) ---No Is this a behavioral health or substance abuse call? ---No Guidelines Guideline Title Affirmed Question Affirmed Notes Nurse Date/Time (Eastern Time) Dizziness - Lightheadedness Taking a medicine that could cause dizziness (e.g., blood pressure medications, diuretics) Gaddy, RN, Solmon Ice 2/70/3500 9:38:18 PM Disp. Time Eilene Ghazi Time) Disposition Final User PLEASE NOTE: All timestamps contained within this report are represented as [place] Standard Time. CONFIDENTIALTY NOTICE: This fax transmission is intended only for the addressee. It contains information that is legally privileged, confidential or otherwise protected from use or disclosure. If you are not the intended recipient, you are strictly prohibited from reviewing, disclosing, copying using or disseminating any of this information or taking any action in reliance on or regarding this information. If you have received this fax in error, please notify us immediately by telephone so that we can arrange for its return to [place]. Phone: [phone], Toll-Free: [phone], Fax: [phone] Page: 2 of 2 Call Id: [id number] 09/18/2018 3:26:54 PM See PCP within 24 Hours Yes Martyn Ehrich, RN, Alphia Kava Disagree/Comply Comply Caller Understands Yes PreDisposition Did not know what to do

## 2018-09-21 ENCOUNTER — Encounter: Payer: Self-pay | Admitting: Family Medicine

## 2018-09-21 NOTE — Progress Notes (Signed)
PCP: Midge Minium, MD Consultation requested by Elyn Aquas PA-C  Subjective:   HPI: Patient is a 73 y.o. female here for left shoulder pain.  Patient reports for about 2 months she's had pain lateral ane posterior left shoulder. No acute injury or trauma. Just discovered the pain one day. Pain up to 8/10 at the worst with overhead motions, sharp. Has no pain at rest. Has not tried anything to treat this yet. No skin changes, numbness.  Past Medical History:  Diagnosis Date  . Allergic rhinitis   . Anxiety   . Arthritis    HANDS  . Benign liver cyst    multiple  . Chronic constipation   . Depression   . Diverticulosis of colon   . Emphysematous COPD ([place])   . External hemorrhoid   . GERD (gastroesophageal reflux disease)   . History of colon polyps   . History of Helicobacter pylori infection   . History of TIA (transient ischemic attack)    apr 2013-- no residual  . Hyperlipidemia   . Occipital neuralgia of left side 12/08/2014  . Osteoporosis   . Pancreatic cyst    simple benign  . PONV (postoperative nausea and vomiting)   . Prolapsed internal hemorrhoids, grade 3   . Simple renal cyst    right   . Wears glasses   . Wears partial dentures    UPPER    Current Outpatient Medications on File Prior to Visit  Medication Sig Dispense Refill  . ALPRAZolam (XANAX) 1 MG tablet 1/2-1 tab 2-3x daily prn anxiety 60 tablet 1  . Ascorbic Acid (VITAMIN C PO) Take by mouth daily.    . Calcium Carb-Cholecalciferol (CALCIUM 600 + D PO) Take 2 tablets by mouth daily.    . cyanocobalamin 500 MCG tablet Take 500 mcg by mouth daily.    Marland Kitchen dicyclomine (BENTYL) 20 MG tablet TAKE ONE TABLET BY MOUTH THREE TIMES DAILY BETWEEN MEALS AS NEEDED. 270 tablet 1  . Docusate Calcium (STOOL SOFTENER PO) Take by mouth.    . DULoxetine (CYMBALTA) 60 MG capsule TAKE 1 CAPSULE BY MOUTH EVERY DAY 90 capsule 1  . ferrous sulfate 325 (65 FE) MG tablet TAKE 1 TABLET (325 MG TOTAL) BY MOUTH  DAILY WITH BREAKFAST. 30 tablet 2  . fluticasone (FLONASE) 50 MCG/ACT nasal spray Place 2 sprays into both nostrils daily. 16 g 6  . loratadine (CLARITIN) 10 MG tablet Take 1 tablet (10 mg total) by mouth daily. 30 tablet 11  . losartan (COZAAR) 25 MG tablet Take 1 tablet (25 mg total) by mouth daily. 30 tablet 1  . Multiple Vitamins-Minerals (MULTIVITAMIN WITH MINERALS) tablet Take 1 tablet by mouth daily.     . pantoprazole (PROTONIX) 40 MG tablet TAKE 1 TABLET BY MOUTH EVERY DAY 90 tablet 2  . simvastatin (ZOCOR) 20 MG tablet TAKE 1 TABLET BY MOUTH EVERYDAY AT BEDTIME 90 tablet 1   No current facility-administered medications on file prior to visit.     Past Surgical History:  Procedure Laterality Date  . APPENDECTOMY  1960  . BREAST EXCISIONAL BIOPSY Left   . BREAST EXCISIONAL BIOPSY Right   . COLONOSCOPY WITH PROPOFOL  last one 10-25-2013   polypectomy  . D & C HYSTEROSCOPY W/ ENDOMETRIAL POLYPECTOMY    . ESOPHAGOGASTRODUODENOSCOPY  last one 03-04-2013  . EXCISION BILATERAL BREAST CYST  1995  . HEMORRHOID SURGERY N/A 04/06/2014   Procedure: Hemmorhoidopexy with anal canal biopsy;  Surgeon: Leighton Ruff, MD;  Location:  [facility];  Service: General;  Laterality: N/A;  . LAPAROSCOPIC RIGHT OVARIAN CYSTECTOMY/  D & C HYSTEROSCOPY ENDOMETRIAL POLYPECTOMY  2011  . TONSILLECTOMY AND ADENOIDECTOMY  1955  . TRANSTHORACIC ECHOCARDIOGRAM  08-13-2012   normal LV/  ef 60-65%/  mild AR/  trivial MR    Allergies  Allergen Reactions  . Erythromycin Other (See Comments)    "severe GI upset, also all "mycins" meds   . Flagyl [Metronidazole] Other (See Comments)    "flu-like symptoms"    Social History   Socioeconomic History  . Marital status: Widowed    Spouse name: Channing MuttersRoy  . Number of children: 2  . Years of education: 2112  . Highest education level: Not on file  Occupational History  . Occupation: [employer]     Employer: [employer]    Comment: Hair  Salon   Social Needs  . Financial resource strain: Not on file  . Food insecurity    Worry: Not on file    Inability: Not on file  . Transportation needs    Medical: Not on file    Non-medical: Not on file  Tobacco Use  . Smoking status: Former Smoker    Packs/day: 1.00    Years: 17.00    Pack years: 17.00    Types: Cigarettes    Quit date: 04/02/1988    Years since quitting: 30.4  . Smokeless tobacco: Never Used  Substance and Sexual Activity  . Alcohol use: No  . Drug use: No  . Sexual activity: Not on file  Lifestyle  . Physical activity    Days per week: Not on file    Minutes per session: Not on file  . Stress: Not on file  Relationships  . Social Musicianconnections    Talks on phone: Not on file    Gets together: Not on file    Attends religious service: Not on file    Active member of club or organization: Not on file    Attends meetings of clubs or organizations: Not on file    Relationship status: Not on file  . Intimate partner violence    Fear of current or ex partner: Not on file    Emotionally abused: Not on file    Physically abused: Not on file    Forced sexual activity: Not on file  Other Topics Concern  . Not on file  Social History Narrative   Patient lives at home with her grandson.  Husband Channing Mutters(Roy) died 2019 as did her daughter Sharyl NimrodMeredith.  Works for [employer]. Patient has high school education and two children (1 deceased). Caffeine 2-3 cups daily.    Family History  Problem Relation Age of Onset  . Alcoholism Father   . Diabetes type II Brother   . Heart attack Brother   . Cirrhosis Mother        non-alcoholic  . Multiple sclerosis Daughter   . Pancreatitis Daughter   . Colon cancer Neg Hx     BP (!) 132/50   Ht 5\' 2"  (1.575 m)   Wt 116 lb (52.6 kg)   LMP 06/16/1997   BMI 21.22 kg/m   Review of Systems: See HPI above.     Objective:  Physical Exam:  Gen: NAD, comfortable in exam room  Left shoulder: No swelling,  ecchymoses.  No gross deformity. No TTP AC, biceps tendon. FROM with mild painful arc. Mildly positive Hawkins, Neers. Negative  Yergasons. Strength 5/5 with empty can and resisted internal/external rotation.  Mild pain with empty can. Negative apprehension. NV intact distally.  Right shoulder: No swelling, ecchymoses.  No gross deformity. No TTP. FROM. Strength 5/5 with empty can and resisted internal/external rotation. NV intact distally.   Assessment & Plan:  1. Left shoulder pain - 2/2 rotator cuff impingement.  Exam otherwise reassuring.  Discussed options - she would like to start with home exercises, tylenol, topical medications - discussed voltaren gel.  Consider physical therapy, injection, ultrasound if not improving.  F/u in 5-6 weeks.

## 2018-09-22 ENCOUNTER — Ambulatory Visit: Payer: Medicare HMO | Admitting: Physician Assistant

## 2018-09-24 ENCOUNTER — Ambulatory Visit (INDEPENDENT_AMBULATORY_CARE_PROVIDER_SITE_OTHER): Payer: Medicare HMO | Admitting: Physician Assistant

## 2018-09-24 ENCOUNTER — Encounter: Payer: Self-pay | Admitting: Physician Assistant

## 2018-09-24 ENCOUNTER — Other Ambulatory Visit: Payer: Self-pay

## 2018-09-24 VITALS — BP 132/64 | HR 78 | Temp 96.8°F | Resp 14 | Ht 61.0 in | Wt 119.0 lb

## 2018-09-24 DIAGNOSIS — I10 Essential (primary) hypertension: Secondary | ICD-10-CM

## 2018-09-24 NOTE — Patient Instructions (Signed)
BP looks much better today! Continue current regimen.  Follow-up with your specialists as scheduled. Alcario Droughtrica has reached out to Neurology to see what the hold up is. She had to leave a message. We will update you as soon as we hear back from them.  Follow-up with Dr. Beverely Lowabori in 4-6 weeks for Hypertension.    DASH Eating Plan DASH stands for "Dietary Approaches to Stop Hypertension." The DASH eating plan is a healthy eating plan that has been shown to reduce high blood pressure (hypertension). It may also reduce your risk for type 2 diabetes, heart disease, and stroke. The DASH eating plan may also help with weight loss. What are tips for following this plan?  General guidelines  Avoid eating more than 2,300 mg (milligrams) of salt (sodium) a day. If you have hypertension, you may need to reduce your sodium intake to 1,500 mg a day.  Limit alcohol intake to no more than 1 drink a day for nonpregnant women and 2 drinks a day for men. One drink equals 12 oz of beer, 5 oz of wine, or 1 oz of hard liquor.  Work with your health care provider to maintain a healthy body weight or to lose weight. Ask what an ideal weight is for you.  Get at least 30 minutes of exercise that causes your heart to beat faster (aerobic exercise) most days of the week. Activities may include walking, swimming, or biking.  Work with your health care provider or diet and nutrition specialist (dietitian) to adjust your eating plan to your individual calorie needs. Reading food labels   Check food labels for the amount of sodium per serving. Choose foods with less than 5 percent of the Daily Value of sodium. Generally, foods with less than 300 mg of sodium per serving fit into this eating plan.  To find whole grains, look for the word "whole" as the first word in the ingredient list. Shopping  Buy products labeled as "low-sodium" or "no salt added."  Buy fresh foods. Avoid canned foods and premade or frozen meals.  Cooking  Avoid adding salt when cooking. Use salt-free seasonings or herbs instead of table salt or sea salt. Check with your health care provider or pharmacist before using salt substitutes.  Do not fry foods. Cook foods using healthy methods such as baking, boiling, grilling, and broiling instead.  Cook with heart-healthy oils, such as olive, canola, soybean, or sunflower oil. Meal planning  Eat a balanced diet that includes: ? 5 or more servings of fruits and vegetables each day. At each meal, try to fill half of your plate with fruits and vegetables. ? Up to 6-8 servings of whole grains each day. ? Less than 6 oz of lean meat, poultry, or fish each day. A 3-oz serving of meat is about the same size as a deck of cards. One egg equals 1 oz. ? 2 servings of low-fat dairy each day. ? A serving of nuts, seeds, or beans 5 times each week. ? Heart-healthy fats. Healthy fats called Omega-3 fatty acids are found in foods such as flaxseeds and coldwater fish, like sardines, salmon, and mackerel.  Limit how much you eat of the following: ? Canned or prepackaged foods. ? Food that is high in trans fat, such as fried foods. ? Food that is high in saturated fat, such as fatty meat. ? Sweets, desserts, sugary drinks, and other foods with added sugar. ? Full-fat dairy products.  Do not salt foods before eating.  Try  to eat at least 2 vegetarian meals each week.  Eat more home-cooked food and less restaurant, buffet, and fast food.  When eating at a restaurant, ask that your food be prepared with less salt or no salt, if possible. What foods are recommended? The items listed may not be a complete list. Talk with your dietitian about what dietary choices are best for you. Grains Whole-grain or whole-wheat bread. Whole-grain or whole-wheat pasta. Brown rice. Modena Morrow. Bulgur. Whole-grain and low-sodium cereals. Pita bread. Low-fat, low-sodium crackers. Whole-wheat flour tortillas.  Vegetables Fresh or frozen vegetables (raw, steamed, roasted, or grilled). Low-sodium or reduced-sodium tomato and vegetable juice. Low-sodium or reduced-sodium tomato sauce and tomato paste. Low-sodium or reduced-sodium canned vegetables. Fruits All fresh, dried, or frozen fruit. Canned fruit in natural juice (without added sugar). Meat and other protein foods Skinless chicken or [place]. Ground chicken or [place]. Pork with fat trimmed off. Fish and seafood. Egg whites. Dried beans, peas, or lentils. Unsalted nuts, nut butters, and seeds. Unsalted canned beans. Lean cuts of beef with fat trimmed off. Low-sodium, lean deli meat. Dairy Low-fat (1%) or fat-free (skim) milk. Fat-free, low-fat, or reduced-fat cheeses. Nonfat, low-sodium ricotta or cottage cheese. Low-fat or nonfat yogurt. Low-fat, low-sodium cheese. Fats and oils Soft margarine without trans fats. Vegetable oil. Low-fat, reduced-fat, or light mayonnaise and salad dressings (reduced-sodium). Canola, safflower, olive, soybean, and sunflower oils. Avocado. Seasoning and other foods Herbs. Spices. Seasoning mixes without salt. Unsalted popcorn and pretzels. Fat-free sweets. What foods are not recommended? The items listed may not be a complete list. Talk with your dietitian about what dietary choices are best for you. Grains Baked goods made with fat, such as croissants, muffins, or some breads. Dry pasta or rice meal packs. Vegetables Creamed or fried vegetables. Vegetables in a cheese sauce. Regular canned vegetables (not low-sodium or reduced-sodium). Regular canned tomato sauce and paste (not low-sodium or reduced-sodium). Regular tomato and vegetable juice (not low-sodium or reduced-sodium). Angie Fava. Olives. Fruits Canned fruit in a light or heavy syrup. Fried fruit. Fruit in cream or butter sauce. Meat and other protein foods Fatty cuts of meat. Ribs. Fried meat. Berniece Salines. Sausage. Bologna and other processed lunch meats. Salami.  Fatback. Hotdogs. Bratwurst. Salted nuts and seeds. Canned beans with added salt. Canned or smoked fish. Whole eggs or egg yolks. Chicken or [place] with skin. Dairy Whole or 2% milk, cream, and half-and-half. Whole or full-fat cream cheese. Whole-fat or sweetened yogurt. Full-fat cheese. Nondairy creamers. Whipped toppings. Processed cheese and cheese spreads. Fats and oils Butter. Stick margarine. Lard. Shortening. Ghee. Bacon fat. Tropical oils, such as coconut, palm kernel, or palm oil. Seasoning and other foods Salted popcorn and pretzels. Onion salt, garlic salt, seasoned salt, table salt, and sea salt. Worcestershire sauce. Tartar sauce. Barbecue sauce. Teriyaki sauce. Soy sauce, including reduced-sodium. Steak sauce. Canned and packaged gravies. Fish sauce. Oyster sauce. Cocktail sauce. Horseradish that you find on the shelf. Ketchup. Mustard. Meat flavorings and tenderizers. Bouillon cubes. Hot sauce and Tabasco sauce. Premade or packaged marinades. Premade or packaged taco seasonings. Relishes. Regular salad dressings. Where to find more information:  National Heart, Lung, and [place]: https://wilson-eaton.com/  American Heart Association: www.heart.org Summary  The DASH eating plan is a healthy eating plan that has been shown to reduce high blood pressure (hypertension). It may also reduce your risk for type 2 diabetes, heart disease, and stroke.  With the DASH eating plan, you should limit salt (sodium) intake to 2,300 mg a day. If you  have hypertension, you may need to reduce your sodium intake to 1,500 mg a day.  When on the DASH eating plan, aim to eat more fresh fruits and vegetables, whole grains, lean proteins, low-fat dairy, and heart-healthy fats.  Work with your health care provider or diet and nutrition specialist (dietitian) to adjust your eating plan to your individual calorie needs. This information is not intended to replace advice given to you by your health care  provider. Make sure you discuss any questions you have with your health care provider. Document Released: 01/09/2011 Document Revised: 01/02/2017 Document Reviewed: 01/14/2016 Elsevier Patient Education  2020 [employer]

## 2018-09-24 NOTE — Progress Notes (Signed)
Patient presents to clinic today for follow-up of hypertension after being started on losartan 25 mg daily at last visit. Endorses taking as directed and tolerating well. Is checking BP at home and noted some BP in 161W systolic initially but much improved now. No further significantly elevated BP as before. Patient denies chest pain, palpitations, lightheadedness, dizziness, vision changes or frequent headaches. Has upcoming appointment with Cardiac electrophysiology (Dr. Lovena Le) due to hx of palpitations and short PR interval noted on EKG at last visit.   Past Medical History:  Diagnosis Date  . Allergic rhinitis   . Anxiety   . Arthritis    HANDS  . Benign liver cyst    multiple  . Chronic constipation   . Depression   . Diverticulosis of colon   . Emphysematous COPD ([place])   . External hemorrhoid   . GERD (gastroesophageal reflux disease)   . History of colon polyps   . History of Helicobacter pylori infection   . History of TIA (transient ischemic attack)    apr 2013-- no residual  . Hyperlipidemia   . Occipital neuralgia of left side 12/08/2014  . Osteoporosis   . Pancreatic cyst    simple benign  . PONV (postoperative nausea and vomiting)   . Prolapsed internal hemorrhoids, grade 3   . Simple renal cyst    right   . Wears glasses   . Wears partial dentures    UPPER    Current Outpatient Medications on File Prior to Visit  Medication Sig Dispense Refill  . ALPRAZolam (XANAX) 1 MG tablet 1/2-1 tab 2-3x daily prn anxiety 60 tablet 1  . Ascorbic Acid (VITAMIN C PO) Take by mouth daily.    . Calcium Carb-Cholecalciferol (CALCIUM 600 + D PO) Take 2 tablets by mouth daily.    . cyanocobalamin 500 MCG tablet Take 500 mcg by mouth daily.    Marland Kitchen dicyclomine (BENTYL) 20 MG tablet TAKE ONE TABLET BY MOUTH THREE TIMES DAILY BETWEEN MEALS AS NEEDED. 270 tablet 1  . Docusate Calcium (STOOL SOFTENER PO) Take by mouth.    . DULoxetine (CYMBALTA) 60 MG capsule TAKE 1 CAPSULE BY MOUTH  EVERY DAY 90 capsule 1  . ferrous sulfate 325 (65 FE) MG tablet TAKE 1 TABLET (325 MG TOTAL) BY MOUTH DAILY WITH BREAKFAST. 30 tablet 2  . fluticasone (FLONASE) 50 MCG/ACT nasal spray Place 2 sprays into both nostrils daily. 16 g 6  . loratadine (CLARITIN) 10 MG tablet Take 1 tablet (10 mg total) by mouth daily. 30 tablet 11  . losartan (COZAAR) 25 MG tablet Take 1 tablet (25 mg total) by mouth daily. 30 tablet 1  . Multiple Vitamins-Minerals (MULTIVITAMIN WITH MINERALS) tablet Take 1 tablet by mouth daily.     . pantoprazole (PROTONIX) 40 MG tablet TAKE 1 TABLET BY MOUTH EVERY DAY 90 tablet 2  . simvastatin (ZOCOR) 20 MG tablet TAKE 1 TABLET BY MOUTH EVERYDAY AT BEDTIME 90 tablet 1   No current facility-administered medications on file prior to visit.     Allergies  Allergen Reactions  . Erythromycin Other (See Comments)    "severe GI upset, also all "mycins" meds   . Flagyl [Metronidazole] Other (See Comments)    "flu-like symptoms"    Family History  Problem Relation Age of Onset  . Alcoholism Father   . Diabetes type II Brother   . Heart attack Brother   . Cirrhosis Mother        non-alcoholic  . Multiple sclerosis  Daughter   . Pancreatitis Daughter   . Colon cancer Neg Hx     Social History   Socioeconomic History  . Marital status: Widowed    Spouse name: Carloyn Manner  . Number of children: 2  . Years of education: 53  . Highest education level: Not on file  Occupational History  . 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 Financial resource strain: Not on file  . Food insecurity    Worry: Not on file    Inability: Not on file  . Transportation needs    Medical: Not on file    Non-medical: Not on file  Tobacco Use  . Smoking status: Former Smoker    Packs/day: 1.00    Years: 17.00    Pack years: 17.00    Types: Cigarettes    Quit date: 04/02/1988    Years since quitting: 30.4  . Smokeless tobacco:  Never Used  Substance and Sexual Activity  . Alcohol use: No  . Drug use: No  . Sexual activity: Not on file  Lifestyle  . Physical activity    Days per week: Not on file    Minutes per session: Not on file  . Stress: Not on file  Relationships  . Social Herbalist on phone: Not on file    Gets together: Not on file    Attends religious service: Not on file    Active member of club or organization: Not on file    Attends meetings of clubs or organizations: Not on file    Relationship status: Not on file  Other Topics Concern  . Not on file  Social History Narrative   Patient lives at home with her grandson.  Husband Carloyn Manner) died 2017-04-19 as did her daughter Ailene Ravel.  Works for [employer]. Patient has high school education and two children (1 deceased). Caffeine 2-3 cups daily.   Review of Systems - See HPI.  All other ROS are negative.  BP 132/64   Pulse 78   Temp (!) 96.8 F (36 C) (Skin)   Resp 14   Ht _0  (1.549 m)   Wt 119 lb (54 kg)   LMP 06/16/1997   SpO2 98%   BMI 22.48 kg/m   Physical Exam Vitals signs reviewed.  Constitutional:      Appearance: Normal appearance.  HENT:     Head: Normocephalic and atraumatic.  Eyes:     Conjunctiva/sclera: Conjunctivae normal.     Pupils: Pupils are equal, round, and reactive to light.  Neck:     Musculoskeletal: Neck supple.  Cardiovascular:     Rate and Rhythm: Normal rate and regular rhythm.     Pulses: Normal pulses.     Heart sounds: Normal heart sounds.  Pulmonary:     Effort: Pulmonary effort is normal.     Breath sounds: Normal breath sounds.  Neurological:     General: No focal deficit present.     Mental Status: She is alert. She is disoriented.  Psychiatric:        Mood and Affect: Mood normal.     Recent Results (from the past 04/19/58 hour(s))  CBC with Differential/Platelet     Status: None   Collection Time: 09/15/18 11:02 AM  Result Value Ref Range   WBC 4.5 4.0 - 10.5  K/uL   RBC 4.44  3.87 - 5.11 Mil/uL   Hemoglobin 13.3 12.0 - 15.0 g/dL   HCT 40.2 36.0 - 46.0 %   MCV 90.7 78.0 - 100.0 fl   MCHC 33.0 30.0 - 36.0 g/dL   RDW 14.3 11.5 - 15.5 %   Platelets 247.0 150.0 - 400.0 K/uL   Neutrophils Relative % 44.5 43.0 - 77.0 %   Lymphocytes Relative 41.6 12.0 - 46.0 %   Monocytes Relative 9.3 3.0 - 12.0 %   Eosinophils Relative 3.6 0.0 - 5.0 %   Basophils Relative 1.0 0.0 - 3.0 %   Neutro Abs 2.0 1.4 - 7.7 K/uL   Lymphs Abs 1.9 0.7 - 4.0 K/uL   Monocytes Absolute 0.4 0.1 - 1.0 K/uL   Eosinophils Absolute 0.2 0.0 - 0.7 K/uL   Basophils Absolute 0.0 0.0 - 0.1 K/uL  TSH     Status: None   Collection Time: 09/15/18 11:02 AM  Result Value Ref Range   TSH 2.19 0.35 - 4.50 uIU/mL  Comp Met (CMET)     Status: Abnormal   Collection Time: 09/15/18 11:02 AM  Result Value Ref Range   Sodium 142 135 - 145 mEq/L   Potassium 3.9 3.5 - 5.1 mEq/L   Chloride 103 96 - 112 mEq/L   CO2 31 19 - 32 mEq/L   Glucose, Bld 65 (L) 70 - 99 mg/dL   BUN 10 6 - 23 mg/dL   Creatinine, Ser 0.89 0.40 - 1.20 mg/dL   Total Bilirubin 0.4 0.2 - 1.2 mg/dL   Alkaline Phosphatase 60 39 - 117 U/L   AST 20 0 - 37 U/L   ALT 15 0 - 35 U/L   Total Protein 6.6 6.0 - 8.3 g/dL   Albumin 4.6 3.5 - 5.2 g/dL   Calcium 9.9 8.4 - 10.5 mg/dL   GFR 62.50 >60.00 mL/min  Lipase     Status: None   Collection Time: 09/15/18 11:02 AM  Result Value Ref Range   Lipase 58.0 11.0 - 59.0 U/L  H. pylori antibody, IgG     Status: None   Collection Time: 09/15/18 11:02 AM  Result Value Ref Range   H Pylori IgG Negative Negative    Assessment/Plan: 1. Essential hypertension BP improved. Asymptomatic. Continue current regimen. Follow-up as scheduled with PCP Birdie Riddle) 9/23 as scheduled for CPE. Will get updated labs at that time. Follow-up with specialists as scheduled.    Leeanne Rio, PA-C

## 2018-09-28 DIAGNOSIS — R69 Illness, unspecified: Secondary | ICD-10-CM | POA: Diagnosis not present

## 2018-09-30 ENCOUNTER — Telehealth: Payer: Self-pay | Admitting: *Deleted

## 2018-09-30 NOTE — Telephone Encounter (Signed)
Patient LM on front desk VM that she really feels like the losartan that Einar Pheasant put her on is helping. She states that she feels good - her BP top number has been 104-119 - she is wanting to make sure that the 104 is safe for her.  She states that she is fine otherwise, but just wanted the peace of mind that this number is okay for her.

## 2018-09-30 NOTE — Telephone Encounter (Signed)
Those numbers sound great to me!  No changes at this time

## 2018-09-30 NOTE — Telephone Encounter (Signed)
Regina Gonzalez I am routing this message to both you and Dr. Birdie Riddle since you saw pt for appt

## 2018-09-30 NOTE — Telephone Encounter (Signed)
Will get PCP input since she manages her chronic issues but as long as she is  without any lightheadedness or dizziness I feel that is an acceptable number. If the numbers are moving moreso towards the low side -- 104-110 we could consider dialing back on dosage.

## 2018-09-30 NOTE — Telephone Encounter (Signed)
Patient notified of PCP recommendations and is agreement and expresses an understanding.   Ok for PEC to Discuss results / PCP recommendations / Schedule patient.   

## 2018-10-12 ENCOUNTER — Other Ambulatory Visit: Payer: Self-pay

## 2018-10-12 ENCOUNTER — Encounter: Payer: Self-pay | Admitting: Internal Medicine

## 2018-10-12 ENCOUNTER — Ambulatory Visit (INDEPENDENT_AMBULATORY_CARE_PROVIDER_SITE_OTHER): Payer: Medicare HMO | Admitting: Internal Medicine

## 2018-10-12 VITALS — BP 122/64 | HR 68 | Ht 61.0 in | Wt 120.0 lb

## 2018-10-12 DIAGNOSIS — R002 Palpitations: Secondary | ICD-10-CM

## 2018-10-12 NOTE — Progress Notes (Signed)
HPI Regina Gonzalez is referred today by Regina Mates, PA for evaluation of palpitations. She is a pleasant 73 yo woman with a h/o anxiety who is referred today for evaluation of palpitations. The patient is solely responsible for her 87 yo grandson. She has ah/o abdominal pain and HA's. She has never had syncope. She notes that her palpitations are infrequent and may occur a month a part. She denies chest pain. She thinks she may have occaisional dyspnea with exertion.  Allergies  Allergen Reactions  . Erythromycin Other (See Comments)    "severe GI upset, also all "mycins" meds   . Flagyl [Metronidazole] Other (See Comments)    "flu-like symptoms"     Current Outpatient Medications  Medication Sig Dispense Refill  . ALPRAZolam (XANAX) 1 MG tablet 1/2-1 tab 2-3x daily prn anxiety 60 tablet 1  . Ascorbic Acid (VITAMIN C PO) Take by mouth daily.    . Calcium Carb-Cholecalciferol (CALCIUM 600 + D PO) Take 2 tablets by mouth daily.    . cyanocobalamin 500 MCG tablet Take 500 mcg by mouth daily.    Marland Kitchen dicyclomine (BENTYL) 20 MG tablet TAKE ONE TABLET BY MOUTH THREE TIMES DAILY BETWEEN MEALS AS NEEDED. 270 tablet 1  . Docusate Calcium (STOOL SOFTENER PO) Take by mouth.    . DULoxetine (CYMBALTA) 60 MG capsule TAKE 1 CAPSULE BY MOUTH EVERY DAY 90 capsule 1  . ferrous sulfate 325 (65 FE) MG tablet TAKE 1 TABLET (325 MG TOTAL) BY MOUTH DAILY WITH BREAKFAST. 30 tablet 2  . fluticasone (FLONASE) 50 MCG/ACT nasal spray Place 2 sprays into both nostrils daily. 16 g 6  . loratadine (CLARITIN) 10 MG tablet Take 1 tablet (10 mg total) by mouth daily. 30 tablet 11  . losartan (COZAAR) 25 MG tablet Take 1 tablet (25 mg total) by mouth daily. 30 tablet 1  . Multiple Vitamins-Minerals (MULTIVITAMIN WITH MINERALS) tablet Take 1 tablet by mouth daily.     . pantoprazole (PROTONIX) 40 MG tablet TAKE 1 TABLET BY MOUTH EVERY DAY 90 tablet 2  . simvastatin (ZOCOR) 20 MG tablet TAKE 1 TABLET BY MOUTH  EVERYDAY AT BEDTIME 90 tablet 1   No current facility-administered medications for this visit.      Past Medical History:  Diagnosis Date  . Allergic rhinitis   . Anxiety   . Arthritis    HANDS  . Benign liver cyst    multiple  . Chronic constipation   . Depression   . Diverticulosis of colon   . Emphysematous COPD ([facility])   . External hemorrhoid   . GERD (gastroesophageal reflux disease)   . History of colon polyps   . History of Helicobacter pylori infection   . History of TIA (transient ischemic attack)    apr 2013-- no residual  . Hyperlipidemia   . Occipital neuralgia of left side 12/08/2014  . Osteoporosis   . Pancreatic cyst    simple benign  . PONV (postoperative nausea and vomiting)   . Prolapsed internal hemorrhoids, grade 3   . Simple renal cyst    right   . Wears glasses   . Wears partial dentures    UPPER    ROS:   All systems reviewed and negative except as noted in the HPI.   Past Surgical History:  Procedure Laterality Date  . APPENDECTOMY  1960  . BREAST EXCISIONAL BIOPSY Left   . BREAST EXCISIONAL BIOPSY Right   . COLONOSCOPY WITH PROPOFOL  last  one 10-25-2013   polypectomy  . D & C HYSTEROSCOPY W/ ENDOMETRIAL POLYPECTOMY    . ESOPHAGOGASTRODUODENOSCOPY  last one 03-04-2013  . EXCISION BILATERAL BREAST CYST  1995  . HEMORRHOID SURGERY N/A 04/06/2014   Procedure: Hemmorhoidopexy with anal canal biopsy;  Surgeon: Romie LeveeAlicia Thomas, MD;  [facility];  Service: General;  Laterality: N/A;  . LAPAROSCOPIC RIGHT OVARIAN CYSTECTOMY/  D & C HYSTEROSCOPY ENDOMETRIAL POLYPECTOMY  2011  . TONSILLECTOMY AND ADENOIDECTOMY  1955  . TRANSTHORACIC ECHOCARDIOGRAM  08-13-2012   normal LV/  ef 60-65%/  mild AR/  trivial MR     Family History  Problem Relation Age of Onset  . Alcoholism Father   . Diabetes type II Brother   . Heart attack Brother   . Cirrhosis Mother        non-alcoholic  . Multiple sclerosis Daughter   . Pancreatitis  Daughter   . Colon cancer Neg Hx      Social History   Socioeconomic History  . Marital status: Widowed    Spouse name: Channing MuttersRoy  . Number of children: 2  . Years of education: 3112  . Highest education level: Not on file  Occupational History  . 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 Financial resource strain: Not on file  . Food insecurity    Worry: Not on file    Inability: Not on file  . Transportation needs    Medical: Not on file    Non-medical: Not on file  Tobacco Use  . Smoking status: Former Smoker    Packs/day: 1.00    Years: 17.00    Pack years: 17.00    Types: Cigarettes    Quit date: 04/02/1988    Years since quitting: 30.5  . Smokeless tobacco: Never Used  Substance and Sexual Activity  . Alcohol use: No  . Drug use: No  . Sexual activity: Not on file  Lifestyle  . Physical activity    Days per week: Not on file    Minutes per session: Not on file  . Stress: Not on file  Relationships  . Social Musicianconnections    Talks on phone: Not on file    Gets together: Not on file    Attends religious service: Not on file    Active member of club or organization: Not on file    Attends meetings of clubs or organizations: Not on file    Relationship status: Not on file  . Intimate partner violence    Fear of current or ex partner: Not on file    Emotionally abused: Not on file    Physically abused: Not on file    Forced sexual activity: Not on file  Other Topics Concern  . Not on file  Social History Narrative   Patient lives at home with her grandson.  Husband Channing Mutters(Roy) died 2019 as did her daughter Sharyl NimrodMeredith.  Works for [employer]. Patient has high school education and two children (1 deceased). Caffeine 2-3 cups daily.     BP 122/64   Pulse 68   Ht 5\' 1"  (1.549 m)   Wt 120 lb (54.4 kg)   LMP 06/16/1997   SpO2 99%   BMI 22.67 kg/m   Physical Exam:  Well appearing NAD HEENT:  Unremarkable Neck:  No JVD, no thyromegally Lymphatics:  No adenopathy Back:  No CVA tenderness Lungs:  Clear HEART:  Regular rate rhythm, no murmurs, no rubs, no clicks Abd:  soft, positive bowel sounds, no organomegally, no rebound, no guarding Ext:  2 plus pulses, no edema, no cyanosis, no clubbing Skin:  No rashes no nodules Neuro:  CN II through XII intact, motor grossly intact  EKG - nsr   Assess/Plan: 1. Palpitations - the etiology is unclear but she is low risk. She is fairly non-specific. I have tried to reassure her. Options for evaluation include watchful waiting and reduction of caffeine or a 14 day cardiac monitor. For now we can wait on the monitor as she may go up to a month without symptoms.  2. Sob - she is not complaining much and I do not perceive that dyspnea is at all lifestyle limiting. I discussed a 2D echo but we will hold off on this for now.   Mikle Bosworth.D.

## 2018-10-12 NOTE — Patient Instructions (Signed)
Medication Instructions:  none If you need a refill on your cardiac medications before your next appointment, please call your pharmacy.   Lab work: none If you have labs (blood work) drawn today and your tests are completely normal, you will receive your results only by: Marland Kitchen MyChart Message (if you have MyChart) OR . A paper copy in the mail If you have any lab test that is abnormal or we need to change your treatment, we will call you to review the results.  Testing/Procedures: none  Follow-Up: 3-4 months with Dr Lovena Le At [facility], you and your health needs are our priority.  As part of our continuing mission to provide you with exceptional heart care, we have created designated Provider Care Teams.  These Care Teams include your primary Cardiologist (physician) and Advanced Practice Providers (APPs -  Physician Assistants and Nurse Practitioners) who all work together to provide you with the care you need, when you need it. .   Any Other Special Instructions Will Be Listed Below (If Applicable). Reduce Caffeine

## 2018-10-13 DIAGNOSIS — N390 Urinary tract infection, site not specified: Secondary | ICD-10-CM | POA: Diagnosis not present

## 2018-10-14 ENCOUNTER — Ambulatory Visit: Payer: Medicare HMO | Admitting: Physician Assistant

## 2018-10-15 ENCOUNTER — Encounter: Payer: Medicare HMO | Admitting: Family Medicine

## 2018-10-27 ENCOUNTER — Ambulatory Visit (INDEPENDENT_AMBULATORY_CARE_PROVIDER_SITE_OTHER): Payer: Medicare HMO | Admitting: Family Medicine

## 2018-10-27 ENCOUNTER — Other Ambulatory Visit: Payer: Self-pay

## 2018-10-27 ENCOUNTER — Encounter: Payer: Self-pay | Admitting: Family Medicine

## 2018-10-27 VITALS — BP 130/80 | HR 65 | Temp 97.4°F | Resp 16 | Ht 61.0 in | Wt 120.5 lb

## 2018-10-27 DIAGNOSIS — Z23 Encounter for immunization: Secondary | ICD-10-CM

## 2018-10-27 DIAGNOSIS — M810 Age-related osteoporosis without current pathological fracture: Secondary | ICD-10-CM | POA: Diagnosis not present

## 2018-10-27 DIAGNOSIS — E7800 Pure hypercholesterolemia, unspecified: Secondary | ICD-10-CM | POA: Diagnosis not present

## 2018-10-27 DIAGNOSIS — Z0000 Encounter for general adult medical examination without abnormal findings: Secondary | ICD-10-CM

## 2018-10-27 DIAGNOSIS — I10 Essential (primary) hypertension: Secondary | ICD-10-CM | POA: Diagnosis not present

## 2018-10-27 LAB — CBC WITH DIFFERENTIAL/PLATELET
Basophils Absolute: 0 10*3/uL (ref 0.0–0.1)
Basophils Relative: 1 % (ref 0.0–3.0)
Eosinophils Absolute: 0.2 10*3/uL (ref 0.0–0.7)
Eosinophils Relative: 4.3 % (ref 0.0–5.0)
HCT: 38.5 % (ref 36.0–46.0)
Hemoglobin: 12.9 g/dL (ref 12.0–15.0)
Lymphocytes Relative: 41.9 % (ref 12.0–46.0)
Lymphs Abs: 1.9 10*3/uL (ref 0.7–4.0)
MCHC: 33.4 g/dL (ref 30.0–36.0)
MCV: 90.5 fl (ref 78.0–100.0)
Monocytes Absolute: 0.4 10*3/uL (ref 0.1–1.0)
Monocytes Relative: 9.4 % (ref 3.0–12.0)
Neutro Abs: 2 10*3/uL (ref 1.4–7.7)
Neutrophils Relative %: 43.4 % (ref 43.0–77.0)
Platelets: 256 10*3/uL (ref 150.0–400.0)
RBC: 4.26 Mil/uL (ref 3.87–5.11)
RDW: 14.6 % (ref 11.5–15.5)
WBC: 4.6 10*3/uL (ref 4.0–10.5)

## 2018-10-27 LAB — TSH: TSH: 2.2 u[IU]/mL (ref 0.35–4.50)

## 2018-10-27 LAB — BASIC METABOLIC PANEL
BUN: 12 mg/dL (ref 6–23)
CO2: 30 mEq/L (ref 19–32)
Calcium: 9.7 mg/dL (ref 8.4–10.5)
Chloride: 100 mEq/L (ref 96–112)
Creatinine, Ser: 0.85 mg/dL (ref 0.40–1.20)
GFR: 65.88 mL/min (ref >60.00)
Glucose, Bld: 62 mg/dL — ABNORMAL LOW (ref 70–99)
Potassium: 3.7 mEq/L (ref 3.5–5.1)
Sodium: 139 mEq/L (ref 135–145)

## 2018-10-27 LAB — HEPATIC FUNCTION PANEL
ALT: 17 U/L (ref 0–35)
AST: 24 U/L (ref 0–37)
Albumin: 4.4 g/dL (ref 3.5–5.2)
Alkaline Phosphatase: 60 U/L (ref 39–117)
Bilirubin, Direct: 0.1 mg/dL (ref 0.0–0.3)
Total Bilirubin: 0.4 mg/dL (ref 0.2–1.2)
Total Protein: 6.5 g/dL (ref 6.0–8.3)

## 2018-10-27 LAB — LIPID PANEL
Cholesterol: 183 mg/dL (ref 0–200)
HDL: 81.7 mg/dL (ref >39.00)
LDL Cholesterol: 89 mg/dL (ref 0–99)
NonHDL: 100.9
Total CHOL/HDL Ratio: 2
Triglycerides: 61 mg/dL (ref 0.0–149.0)
VLDL: 12.2 mg/dL (ref 0.0–40.0)

## 2018-10-27 LAB — VITAMIN D 25 HYDROXY (VIT D DEFICIENCY, FRACTURES): VITD: 45.65 ng/mL (ref 30.00–100.00)

## 2018-10-27 NOTE — Progress Notes (Signed)
   Subjective:    Patient ID: Regina Gonzalez, female    DOB: [birth year], 73 y.o.   MRN: [id number]  HPI CPE- UTD on mammo, colonoscopy, pneumonia vaccines.  Due for flu today.  Here today for [place].  Risk Factors: HTN- chronic problem, on Losartan 25mg  daily Hyperlipidemia- chronic problem, on Simvastatin 20mg  daily Osteoporosis- chronic problem, on Ca+ Vit D Physical Activity: walking intermittently Fall Risk: low Depression: chronic problem.  Continues to struggle w/ sxs despite medication.  Got a new puppy Hearing: normal to conversational tones, decreased to whispered voice ADL's: independent Cognitive: normal linear thought process, memory and attention intact Home Safety: safe at home, raising grandson Height, Weight, BMI, Visual Acuity: see vitals, vision corrected to 20/20 w/ glasses Counseling: UTD on mammo, colonoscopy, pneumonia vaccines.  Will get flu today. Due for DEXA- ordered Labs Ordered: See A&P Care Plan: See A&P    Review of Systems Patient reports no vision/ hearing changes, adenopathy,fever, weight change,  persistant/recurrent hoarseness , swallowing issues, chest pain, palpitations, edema, persistant/recurrent cough, hemoptysis, dyspnea (rest/exertional/paroxysmal nocturnal), gastrointestinal bleeding (melena, rectal bleeding), abdominal pain, significant heartburn, bowel changes, GU symptoms (dysuria, hematuria, incontinence), Gyn symptoms (abnormal  bleeding, pain),  syncope, focal weakness, memory loss, numbness & tingling, skin/hair/nail changes, abnormal bruising or bleeding, anxiety, or depression.     Objective:   Physical Exam General Appearance:    Alert, cooperative, no distress, appears stated age  Head:    Normocephalic, without obvious abnormality, atraumatic  Eyes:    PERRL, conjunctiva/corneas clear, EOM's intact, fundi    benign, both eyes  Ears:    Normal TM's and external ear canals, both ears  Nose:   Deferred due to COVID   Throat:   Neck:   Supple, symmetrical, trachea midline, no adenopathy;    Thyroid: no enlargement/tenderness/nodules  Back:     Symmetric, no curvature, ROM normal, no CVA tenderness  Lungs:     Clear to auscultation bilaterally, respirations unlabored  Chest Wall:    No tenderness or deformity   Heart:    Regular rate and rhythm, S1 and S2 normal, no murmur, rub   or gallop  Breast Exam:    Deferred to mammo  Abdomen:     Soft, non-tender, bowel sounds active all four quadrants,    no masses, no organomegaly  Genitalia:    Deferred  Rectal:    Extremities:   Extremities normal, atraumatic, no cyanosis or edema  Pulses:   2+ and symmetric all extremities  Skin:   Skin color, texture, turgor normal, no rashes or lesions  Lymph nodes:   Cervical, supraclavicular, and axillary nodes normal  Neurologic:   CNII-XII intact, normal strength, sensation and reflexes    throughout          Assessment & Plan:

## 2018-10-27 NOTE — Assessment & Plan Note (Signed)
Pt's PE unchanged from previous.  UTD on mammo, colonoscopy, pneumonia vaccines.  Flu shot given today.  Will get DEXA.  Check labs.  Anticipatory guidance provided.

## 2018-10-27 NOTE — Assessment & Plan Note (Signed)
Chronic problem.  Adequate control.  Asymptomatic.  Check labs.  No anticipated med changes.  Will follow. 

## 2018-10-27 NOTE — Assessment & Plan Note (Signed)
Chronic problem.  On Ca and Vit D but no prescription medication.  Due for repeat DEXA.  Will discuss next steps depending on results.  Pt expressed understanding and is in agreement w/ plan.

## 2018-10-27 NOTE — Patient Instructions (Signed)
Follow up in 6 months to recheck BP and cholesterol We'll notify you of your lab results and make any changes if needed Make sure you are getting enough rest!  This will improve mood and energy and pain We will call you to schedule your bone density test Call with any questions or concerns Stay Safe!!! Hang in there!

## 2018-10-27 NOTE — Assessment & Plan Note (Signed)
Chronic problem.  Tolerating statin w/o difficulty.  Check labs.  Adjust meds prn  

## 2018-10-29 ENCOUNTER — Encounter: Payer: Self-pay | Admitting: General Practice

## 2018-11-01 ENCOUNTER — Ambulatory Visit: Payer: Medicare HMO | Admitting: Family Medicine

## 2018-11-04 ENCOUNTER — Telehealth: Payer: Self-pay | Admitting: Neurology

## 2018-11-04 ENCOUNTER — Ambulatory Visit: Payer: Medicare HMO | Admitting: Neurology

## 2018-11-04 ENCOUNTER — Other Ambulatory Visit: Payer: Self-pay

## 2018-11-04 ENCOUNTER — Encounter: Payer: Self-pay | Admitting: Neurology

## 2018-11-04 VITALS — BP 124/67 | HR 65 | Temp 97.8°F | Ht 61.5 in | Wt 120.3 lb

## 2018-11-04 DIAGNOSIS — G459 Transient cerebral ischemic attack, unspecified: Secondary | ICD-10-CM | POA: Diagnosis not present

## 2018-11-04 NOTE — Progress Notes (Signed)
Reason for visit: Transient left face and arm numbness  Referring physician: Dr. Nathaneil Canary [name] is a 73 y.o. female  History of present illness:  Ms. Clay is a 73 year old right-handed white female with a history of hypercholesterolemia and hypertension.  The patient comes to the office at this point for an evaluation of an event that occurred about 6 weeks ago.  The patient was on vacation, she had sudden onset of left face and left arm numbness unassociated with any other symptoms.  This event lasted about 30 minutes and then cleared.  The patient has not had recurrence of the symptoms since.  She denied any vision changes, speech changes, or weakness or clumsiness when the numbness ensued.  The patient did not seek medical attention for the episode.  The patient had no changes in balance.  She denies any new symptoms since the above event.  She does report some occasional palpitations of the heart, she has been seen through cardiology in the past.  A heart monitor study was not done.  She comes to this office for an evaluation.  She denies any headaches associated with the above event.  Past Medical History:  Diagnosis Date  . Allergic rhinitis   . Anxiety   . Arthritis    HANDS  . Benign liver cyst    multiple  . Chronic constipation   . Depression   . Diverticulosis of colon   . Emphysematous COPD ([place])   . External hemorrhoid   . GERD (gastroesophageal reflux disease)   . History of colon polyps   . History of Helicobacter pylori infection   . History of TIA (transient ischemic attack)    apr 2013-- no residual  . Hyperlipidemia   . Occipital neuralgia of left side 12/08/2014  . Osteoporosis   . Pancreatic cyst    simple benign  . PONV (postoperative nausea and vomiting)   . Prolapsed internal hemorrhoids, grade 3   . Simple renal cyst    right   . Wears glasses   . Wears partial dentures    UPPER    Past Surgical History:  Procedure  Laterality Date  . APPENDECTOMY  1960  . BREAST EXCISIONAL BIOPSY Left   . BREAST EXCISIONAL BIOPSY Right   . COLONOSCOPY WITH PROPOFOL  last one 10-25-2013   polypectomy  . D & C HYSTEROSCOPY W/ ENDOMETRIAL POLYPECTOMY    . ESOPHAGOGASTRODUODENOSCOPY  last one 03-04-2013  . EXCISION BILATERAL BREAST CYST  1995  . HEMORRHOID SURGERY N/A 04/06/2014   Procedure: Hemmorhoidopexy with anal canal biopsy;  Surgeon: Leighton Ruff, MD;  Location[facility];  Service: General;  Laterality: N/A;  . LAPAROSCOPIC RIGHT OVARIAN CYSTECTOMY/  D & C HYSTEROSCOPY ENDOMETRIAL POLYPECTOMY  2011  . TONSILLECTOMY AND ADENOIDECTOMY  1955  . TRANSTHORACIC ECHOCARDIOGRAM  08-13-2012   normal LV/  ef 60-65%/  mild AR/  trivial MR    Family History  Problem Relation Age of Onset  . Alcoholism Father   . Diabetes type II Brother   . Heart attack Brother   . Cirrhosis Mother        non-alcoholic  . Multiple sclerosis Daughter   . Pancreatitis Daughter   . Colon cancer Neg Hx     Social history:  reports that she quit smoking about 30 years ago. Her smoking use included cigarettes. She has a 17.00 pack-year smoking history. She has never used smokeless tobacco. She reports that she does not  drink alcohol or use drugs.  Medications:  Prior to Admission medications   Medication Sig Start Date End Date Taking? Authorizing Provider  ALPRAZolam Prudy Feeler(XANAX) 1 MG tablet 1/2-1 tab 2-3x daily prn anxiety 06/16/18  Yes Sheliah Hatchabori, Katherine E, MD  Ascorbic Acid (VITAMIN C PO) Take by mouth daily.   Yes [provider]  Calcium Carb-Cholecalciferol (CALCIUM 600 + D PO) Take 2 tablets by mouth daily.   Yes [provider]  cyanocobalamin 500 MCG tablet Take 500 mcg by mouth daily.   Yes [provider]  dicyclomine (BENTYL) 20 MG tablet TAKE ONE TABLET BY MOUTH THREE TIMES DAILY BETWEEN MEALS AS NEEDED. 05/10/18  Yes Sheliah Hatchabori, Katherine E, MD  Docusate Calcium (STOOL SOFTENER PO) Take by  mouth.   Yes [provider]  DULoxetine (CYMBALTA) 60 MG capsule TAKE 1 CAPSULE BY MOUTH EVERY DAY 08/17/18  Yes Sheliah Hatchabori, Katherine E, MD  ferrous sulfate 325 (65 FE) MG tablet TAKE 1 TABLET (325 MG TOTAL) BY MOUTH DAILY WITH BREAKFAST. 02/25/16  Yes Sheliah Hatchabori, Katherine E, MD  fluticasone (FLONASE) 50 MCG/ACT nasal spray Place 2 sprays into both nostrils daily. 01/06/18  Yes Sheliah Hatchabori, Katherine E, MD  LINZESS 72 MCG capsule TAKE 1 CAPSULE ON AN EMPTY STOMACH ONCE A DAY 10/13/18  Yes [provider]  loratadine (CLARITIN) 10 MG tablet Take 1 tablet (10 mg total) by mouth daily. 01/06/18  Yes Sheliah Hatchabori, Katherine E, MD  losartan (COZAAR) 25 MG tablet Take 1 tablet (25 mg total) by mouth daily. 09/15/18  Yes Waldon MerlMartin, William C, PA-C  Multiple Vitamins-Minerals (MULTIVITAMIN WITH MINERALS) tablet Take 1 tablet by mouth daily.    Yes [provider]  pantoprazole (PROTONIX) 40 MG tablet TAKE 1 TABLET BY MOUTH EVERY DAY 01/20/18  Yes Sheliah Hatchabori, Katherine E, MD  simvastatin (ZOCOR) 20 MG tablet TAKE 1 TABLET BY MOUTH EVERYDAY AT BEDTIME 08/26/18  Yes Sheliah Hatchabori, Katherine E, MD      Allergies  Allergen Reactions  . Erythromycin Other (See Comments)    "severe GI upset, also all "mycins" meds   . Flagyl [Metronidazole] Other (See Comments)    "flu-like symptoms"    ROS:  Out of a complete 14 system review of symptoms, the patient complains only of the following symptoms, and all other reviewed systems are negative.  Numbness  Blood pressure 124/67, pulse 65, temperature 97.8 F (36.6 C), temperature source Temporal, height 5' 1.5" (1.562 m), weight 120 lb 5 oz (54.6 kg), last menstrual period 06/16/1997.  Physical Exam  General: The patient is alert and cooperative at the time of the examination.  Eyes: Pupils are equal, round, and reactive to light. Discs are flat bilaterally.  Neck: The neck is supple, no carotid bruits are noted.  Respiratory: The respiratory examination is  clear.  Cardiovascular: The cardiovascular examination reveals a regular rate and rhythm, no obvious murmurs or rubs are noted.  Skin: Extremities are without significant edema.  Neurologic Exam  Mental status: The patient is alert and oriented x 3 at the time of the examination. The patient has apparent normal recent and remote memory, with an apparently normal attention span and concentration ability.  Cranial nerves: Facial symmetry is present. There is good sensation of the face to pinprick and soft touch bilaterally. The strength of the facial muscles and the muscles to head turning and shoulder shrug are normal bilaterally. Speech is well enunciated, no aphasia or dysarthria is noted. Extraocular movements are full. Visual fields are full. The tongue is  midline, and the patient has symmetric elevation of the soft palate. No obvious hearing deficits are noted.  Motor: The motor testing reveals 5 over 5 strength of all 4 extremities. Good symmetric motor tone is noted throughout.  Sensory: Sensory testing is intact to pinprick, soft touch, vibration sensation, and position sense on all 4 extremities. No evidence of extinction is noted.  Coordination: Cerebellar testing reveals good finger-nose-finger and heel-to-shin bilaterally.  Gait and station: Gait is normal. Tandem gait is normal. Romberg is negative. No drift is seen.  Reflexes: Deep tendon reflexes are symmetric and normal bilaterally. Toes are downgoing bilaterally.   Assessment/Plan:  1.  Transient left face and arm numbness, possible TIA  The patient is not on low-dose aspirin at this point, she will go on 81 mg of aspirin.  She will be set up for a carotid Doppler study and a MRI of the brain.  If a stroke event is identified, a 30-day cardiac monitor study may be done.  The patient will otherwise follow-up here if needed.  Marlan Palau MD 11/04/2018 10:21 AM  [facility] [address] [place], [place] [address]  Phone [phone] Fax [phone]

## 2018-11-04 NOTE — Patient Instructions (Signed)
Start low dose aspirin (81mg) daily

## 2018-11-04 NOTE — Telephone Encounter (Signed)
[employer] medicare order sent to GI. They will obtain the auth and reach out to the patient to schedule.  °

## 2018-11-05 ENCOUNTER — Other Ambulatory Visit: Payer: Self-pay | Admitting: Physician Assistant

## 2018-11-05 DIAGNOSIS — I10 Essential (primary) hypertension: Secondary | ICD-10-CM

## 2018-11-09 ENCOUNTER — Ambulatory Visit (HOSPITAL_BASED_OUTPATIENT_CLINIC_OR_DEPARTMENT_OTHER)
Admission: RE | Admit: 2018-11-09 | Discharge: 2018-11-09 | Disposition: A | Discharge status: Home / Self Care | Payer: Medicare HMO | Source: Ambulatory Visit | Attending: Family Medicine | Admitting: Family Medicine

## 2018-11-09 ENCOUNTER — Other Ambulatory Visit: Payer: Self-pay

## 2018-11-09 DIAGNOSIS — M810 Age-related osteoporosis without current pathological fracture: Secondary | ICD-10-CM | POA: Diagnosis not present

## 2018-11-09 DIAGNOSIS — Z780 Asymptomatic menopausal state: Secondary | ICD-10-CM | POA: Diagnosis not present

## 2018-11-19 ENCOUNTER — Other Ambulatory Visit: Payer: Self-pay | Admitting: Family Medicine

## 2018-11-22 NOTE — Telephone Encounter (Signed)
[employer] medicare Josem Kaufmann: [id number] (exp. 11/19/18 to 05/18/19) patient is scheduled at GI for 11/24/18.

## 2018-11-24 ENCOUNTER — Ambulatory Visit
Admission: RE | Admit: 2018-11-24 | Discharge: 2018-11-24 | Disposition: A | Discharge status: Home / Self Care | Payer: Medicare HMO | Source: Ambulatory Visit | Attending: Neurology | Admitting: Neurology

## 2018-11-24 DIAGNOSIS — G459 Transient cerebral ischemic attack, unspecified: Secondary | ICD-10-CM

## 2018-11-25 ENCOUNTER — Telehealth: Payer: Self-pay | Admitting: Neurology

## 2018-11-25 NOTE — Telephone Encounter (Signed)
I called the patient.  MRI of the brain did not show evidence of a stroke event that would correlate with a transient episode of left-sided numbness that occurred.  Carotid Doppler studies pending.  The patient will remain on aspirin.   MRI brain 11/25/18:  IMPRESSION:   MRI brain (without) demonstrating: - Mild scattered periventricular subcortical foci of nonspecific gliosis. - No acute findings.

## 2018-12-08 ENCOUNTER — Ambulatory Visit (HOSPITAL_COMMUNITY)
Admission: RE | Admit: 2018-12-08 | Discharge: 2018-12-08 | Disposition: A | Discharge status: Home / Self Care | Payer: Medicare HMO | Source: Ambulatory Visit | Attending: Neurology | Admitting: Neurology

## 2018-12-08 ENCOUNTER — Other Ambulatory Visit: Payer: Self-pay | Admitting: Family Medicine

## 2018-12-08 ENCOUNTER — Other Ambulatory Visit: Payer: Self-pay

## 2018-12-08 DIAGNOSIS — G459 Transient cerebral ischemic attack, unspecified: Secondary | ICD-10-CM | POA: Insufficient documentation

## 2018-12-08 NOTE — Telephone Encounter (Signed)
Last OV 10/27/18 Alprazolam last filled 06/16/18 #60 with 1

## 2018-12-13 ENCOUNTER — Other Ambulatory Visit: Payer: Self-pay

## 2018-12-13 DIAGNOSIS — Z20822 Contact with and (suspected) exposure to covid-19: Secondary | ICD-10-CM

## 2018-12-14 LAB — NOVEL CORONAVIRUS, NAA: SARS-CoV-2, NAA: NOT DETECTED

## 2019-01-16 ENCOUNTER — Other Ambulatory Visit: Payer: Self-pay | Admitting: Family Medicine

## 2019-01-17 DIAGNOSIS — R05 Cough: Secondary | ICD-10-CM | POA: Diagnosis not present

## 2019-01-19 ENCOUNTER — Ambulatory Visit: Payer: Medicare HMO | Admitting: Internal Medicine

## 2019-03-02 ENCOUNTER — Other Ambulatory Visit: Payer: Self-pay

## 2019-03-02 ENCOUNTER — Encounter: Payer: Self-pay | Admitting: Family Medicine

## 2019-03-02 ENCOUNTER — Ambulatory Visit (INDEPENDENT_AMBULATORY_CARE_PROVIDER_SITE_OTHER): Payer: Medicare HMO | Admitting: Family Medicine

## 2019-03-02 DIAGNOSIS — L299 Pruritus, unspecified: Secondary | ICD-10-CM

## 2019-03-02 NOTE — Progress Notes (Signed)
Virtual Visit via Video   I connected with patient on 03/02/19 at  1:30 PM EST by a video enabled telemedicine application and verified that I am speaking with the correct person using two identifiers.  Location patient: Home Location provider: Acupuncturist, Office Persons participating in the virtual visit: Patient, Provider, [place] (Jess B)  I discussed the limitations of evaluation and management by telemedicine and the availability of in person appointments. The patient expressed understanding and agreed to proceed.  Subjective:   HPI:   Itching- L outer leg above the knee.  Intermittent itching.  No change in soaps, detergents.  No rash.  Improves w/ hydrocortisone cream.  sxs started a few weeks ago.  No tingling or burning.  Not occurring in other locations.    ROS:   See pertinent positives and negatives per HPI.  Patient Active Problem List   Diagnosis Date Noted  . HTN (hypertension) 10/27/2018  . Fibromyalgia 01/06/2018  . Gout 07/07/2017  . Moderate episode of recurrent major depressive disorder ([place]) 06/19/2017  . Female bladder prolapse 03/15/2015  . Trapezius muscle spasm 12/04/2014  . Lateral epicondylitis of right elbow 12/04/2014  . History of tobacco abuse 09/15/2014  . Memory loss 05/11/2014  . Postmenopausal bleeding 07/08/2013  . External hemorrhoid, bleeding 04/29/2013  . Osteoporosis 07/05/2012  . TIA (transient ischemic attack) 07/05/2012  . Routine general medical examination at a health care facility 07/05/2012  . Hypopigmentation 09/01/2010  . GERD 04/15/2010  . IRRITABLE BOWEL SYNDROME 04/15/2010  . FLATULENCE ERUCTATION AND GAS PAIN 04/15/2010  . SHORTNESS OF BREATH 02/20/2010  . HELICOBACTER PYLORI GASTRITIS, HX OF 01/10/2010  . Hyperlipidemia 12/06/2009  . ANEMIA-NOS 12/06/2009  . Depression 12/06/2009  . ALLERGIC RHINITIS 12/06/2009  . BARRETTS ESOPHAGUS 12/06/2009    Social History   Tobacco Use  . Smoking status: Former  Smoker    Packs/day: 1.00    Years: 17.00    Pack years: 17.00    Types: Cigarettes    Quit date: 04/02/1988    Years since quitting: 30.9  . Smokeless tobacco: Never Used  Substance Use Topics  . Alcohol use: No    Current Outpatient Medications:  .  ALPRAZolam (XANAX) 1 MG tablet, TAKE 1/2 TO 1 TABLET BY MOUTH 2-3 TIMES DAILY AS NEEDED FOR ANXIETY, Disp: 60 tablet, Rfl: 1 .  Ascorbic Acid (VITAMIN C PO), Take by mouth daily., Disp: , Rfl:  .  Calcium Carb-Cholecalciferol (CALCIUM 600 + D PO), Take 2 tablets by mouth daily., Disp: , Rfl:  .  cyanocobalamin 500 MCG tablet, Take 500 mcg by mouth daily., Disp: , Rfl:  .  dicyclomine (BENTYL) 20 MG tablet, TAKE ONE TABLET BY MOUTH THREE TIMES DAILY BETWEEN MEALS AS NEEDED., Disp: 270 tablet, Rfl: 1 .  Docusate Calcium (STOOL SOFTENER PO), Take by mouth., Disp: , Rfl:  .  DULoxetine (CYMBALTA) 60 MG capsule, TAKE 1 CAPSULE BY MOUTH EVERY DAY, Disp: 90 capsule, Rfl: 3 .  ferrous sulfate 325 (65 FE) MG tablet, TAKE 1 TABLET (325 MG TOTAL) BY MOUTH DAILY WITH BREAKFAST., Disp: 30 tablet, Rfl: 2 .  fluticasone (FLONASE) 50 MCG/ACT nasal spray, Place 2 sprays into both nostrils daily., Disp: 16 g, Rfl: 6 .  LINZESS 72 MCG capsule, TAKE 1 CAPSULE ON AN EMPTY STOMACH ONCE A DAY, Disp: , Rfl:  .  loratadine (CLARITIN) 10 MG tablet, Take 1 tablet (10 mg total) by mouth daily., Disp: 30 tablet, Rfl: 11 .  losartan (COZAAR) 25 MG  tablet, TAKE 1 TABLET BY MOUTH EVERY DAY, Disp: 90 tablet, Rfl: 1 .  Multiple Vitamins-Minerals (MULTIVITAMIN WITH MINERALS) tablet, Take 1 tablet by mouth daily. , Disp: , Rfl:  .  pantoprazole (PROTONIX) 40 MG tablet, TAKE 1 TABLET BY MOUTH EVERY DAY, Disp: 90 tablet, Rfl: 2 .  simvastatin (ZOCOR) 20 MG tablet, TAKE 1 TABLET BY MOUTH EVERYDAY AT BEDTIME, Disp: 90 tablet, Rfl: 1  Allergies  Allergen Reactions  . Erythromycin Other (See Comments)    "severe GI upset, also all "mycins" meds   . Flagyl [Metronidazole] Other  (See Comments)    "flu-like symptoms"    Objective:   LMP 06/16/1997  AAOx3, NAD NCAT, EOMI No obvious CN deficits Coloring WNL Pt is able to speak clearly, coherently without shortness of breath or increased work of breathing.  Thought process is linear.  Mood is appropriate.   Assessment and Plan:   Itching- suspect meralgia paresthetica but pt doesn't have typical numbness or burning.  Itching can be a variant of femoral cutaneous nerve irritation.  Reassured her that she did not have other areas that were itching and no rash was present, so this was unlikely to be systemic.  Reviewed importance of loose clothing, body positioning, etc.  To use OTC antihistamine and hydrocortisone cream PRN.  Pt expressed understanding and is in agreement w/ plan.    Neena Rhymes, MD 03/02/2019

## 2019-03-02 NOTE — Progress Notes (Signed)
I have discussed the procedure for the virtual visit with the patient who has given consent to proceed with assessment and treatment.   Pt unable to obtain vitals.   Regina Gonzalez, CMA     

## 2019-03-17 ENCOUNTER — Ambulatory Visit: Payer: Medicare HMO | Admitting: Internal Medicine

## 2019-03-20 ENCOUNTER — Ambulatory Visit: Payer: Medicare HMO | Attending: Internal Medicine

## 2019-03-20 DIAGNOSIS — Z23 Encounter for immunization: Secondary | ICD-10-CM | POA: Insufficient documentation

## 2019-03-20 NOTE — Progress Notes (Signed)
   Covid-19 Vaccination Clinic  Name:  Regina Gonzalez    MRN: [id number] DOB: [birth year]  03/20/2019  Regina Gonzalez was observed post Covid-19 immunization for 15 minutes without incidence. She was provided with Vaccine Information Sheet and instruction to access the V-Safe system.   Regina Gonzalez was instructed to call 911 with any severe reactions post vaccine: Marland Kitchen Difficulty breathing  . Swelling of your face and throat  . A fast heartbeat  . A bad rash all over your body  . Dizziness and weakness    Immunizations Administered    Name Date Dose VIS Date Route   Pfizer COVID-19 Vaccine 03/20/2019 12:37 PM 0.3 mL 01/14/2019 Intramuscular   Manufacturer: [employer], [employer]   Lot: [id number]   NDC: [address]-2

## 2019-04-11 ENCOUNTER — Other Ambulatory Visit: Payer: Self-pay | Admitting: General Practice

## 2019-04-11 MED ORDER — PANTOPRAZOLE SODIUM 40 MG PO TBEC: 40.0000 mg | DELAYED_RELEASE_TABLET | Freq: Every day | ORAL | 1 refills | Status: DC

## 2019-04-12 ENCOUNTER — Ambulatory Visit: Payer: Medicare HMO | Attending: Internal Medicine

## 2019-04-12 DIAGNOSIS — Z23 Encounter for immunization: Secondary | ICD-10-CM

## 2019-04-12 NOTE — Progress Notes (Signed)
   Covid-19 Vaccination Clinic  Name:  Regina Gonzalez    MRN: [id number] DOB: 09-Mar-1945  04/12/2019  [name] was observed post Covid-19 immunization for 15 minutes without incident. She was provided with Vaccine Information Sheet and instruction to access the V-Safe system.   [name] was instructed to call 911 with any severe reactions post vaccine: Marland Kitchen Difficulty breathing  . Swelling of face and throat  . A fast heartbeat  . A bad rash all over body  . Dizziness and weakness   Immunizations Administered    Name Date Dose VIS Date Route   Pfizer COVID-19 Vaccine 04/12/2019  5:52 PM 0.3 mL 01/14/2019 Intramuscular   Manufacturer: [employer], [employer]   Lot: [id number]   NDC: [address]-3

## 2019-04-16 ENCOUNTER — Other Ambulatory Visit: Payer: Self-pay | Admitting: Family Medicine

## 2019-04-22 ENCOUNTER — Other Ambulatory Visit: Payer: Self-pay | Admitting: Family Medicine

## 2019-04-22 DIAGNOSIS — Z1231 Encounter for screening mammogram for malignant neoplasm of breast: Secondary | ICD-10-CM

## 2019-04-27 ENCOUNTER — Other Ambulatory Visit: Payer: Self-pay

## 2019-04-27 ENCOUNTER — Encounter: Payer: Self-pay | Admitting: Family Medicine

## 2019-04-27 ENCOUNTER — Ambulatory Visit (INDEPENDENT_AMBULATORY_CARE_PROVIDER_SITE_OTHER): Payer: Medicare HMO | Admitting: Family Medicine

## 2019-04-27 VITALS — BP 128/80 | HR 84 | Temp 97.9°F | Resp 16 | Ht 62.0 in | Wt 120.4 lb

## 2019-04-27 DIAGNOSIS — I10 Essential (primary) hypertension: Secondary | ICD-10-CM | POA: Diagnosis not present

## 2019-04-27 DIAGNOSIS — E7800 Pure hypercholesterolemia, unspecified: Secondary | ICD-10-CM

## 2019-04-27 DIAGNOSIS — F330 Major depressive disorder, recurrent, mild: Secondary | ICD-10-CM | POA: Diagnosis not present

## 2019-04-27 LAB — CBC WITH DIFFERENTIAL/PLATELET
Basophils Absolute: 0 10*3/uL (ref 0.0–0.1)
Basophils Relative: 0.7 % (ref 0.0–3.0)
Eosinophils Absolute: 0.1 10*3/uL (ref 0.0–0.7)
Eosinophils Relative: 2.4 % (ref 0.0–5.0)
HCT: 30.4 % — ABNORMAL LOW (ref 36.0–46.0)
Hemoglobin: 10 g/dL — ABNORMAL LOW (ref 12.0–15.0)
Lymphocytes Relative: 20.4 % (ref 12.0–46.0)
Lymphs Abs: 1.1 10*3/uL (ref 0.7–4.0)
MCHC: 32.9 g/dL (ref 30.0–36.0)
MCV: 81.2 fl (ref 78.0–100.0)
Monocytes Absolute: 0.4 10*3/uL (ref 0.1–1.0)
Monocytes Relative: 7.5 % (ref 3.0–12.0)
Neutro Abs: 3.7 10*3/uL (ref 1.4–7.7)
Neutrophils Relative %: 69 % (ref 43.0–77.0)
Platelets: 283 10*3/uL (ref 150.0–400.0)
RBC: 3.74 Mil/uL — ABNORMAL LOW (ref 3.87–5.11)
RDW: 14.6 % (ref 11.5–15.5)
WBC: 5.4 10*3/uL (ref 4.0–10.5)

## 2019-04-27 LAB — HEPATIC FUNCTION PANEL
ALT: 13 U/L (ref 0–35)
AST: 18 U/L (ref 0–37)
Albumin: 4.3 g/dL (ref 3.5–5.2)
Alkaline Phosphatase: 59 U/L (ref 39–117)
Bilirubin, Direct: 0 mg/dL (ref 0.0–0.3)
Total Bilirubin: 0.3 mg/dL (ref 0.2–1.2)
Total Protein: 6.4 g/dL (ref 6.0–8.3)

## 2019-04-27 LAB — BASIC METABOLIC PANEL
BUN: 14 mg/dL (ref 6–23)
CO2: 30 mEq/L (ref 19–32)
Calcium: 9.5 mg/dL (ref 8.4–10.5)
Chloride: 103 mEq/L (ref 96–112)
Creatinine, Ser: 0.89 mg/dL (ref 0.40–1.20)
GFR: 62.39 mL/min (ref >60.00)
Glucose, Bld: 96 mg/dL (ref 70–99)
Potassium: 3.9 mEq/L (ref 3.5–5.1)
Sodium: 140 mEq/L (ref 135–145)

## 2019-04-27 LAB — TSH: TSH: 1.89 u[IU]/mL (ref 0.35–4.50)

## 2019-04-27 LAB — LIPID PANEL
Cholesterol: 175 mg/dL (ref 0–200)
HDL: 82 mg/dL (ref >39.00)
LDL Cholesterol: 81 mg/dL (ref 0–99)
NonHDL: 92.53
Total CHOL/HDL Ratio: 2
Triglycerides: 57 mg/dL (ref 0.0–149.0)
VLDL: 11.4 mg/dL (ref 0.0–40.0)

## 2019-04-27 NOTE — Assessment & Plan Note (Signed)
Chronic problem.  Tolerating statin w/o difficulty.  Check labs.  Adjust meds prn  

## 2019-04-27 NOTE — Assessment & Plan Note (Signed)
Deteriorated due to COVID and long winter.  Pt is hopeful that mood will improve as the weather does.  Continue current medications.  Will follow closely.

## 2019-04-27 NOTE — Patient Instructions (Signed)
Schedule your complete physical in 6 months We'll notify you of your lab results and make any changes if needed Keep up the good work!  You look great!! Call with any questions or concerns Stay Safe!  Stay Healthy!! 

## 2019-04-27 NOTE — Progress Notes (Signed)
   Subjective:    Patient ID: Regina Gonzalez, female    DOB: 1945/02/21, 74 y.o.   MRN: [id number]  HPI HTN- chronic problem, on Losartan 25mg  daily w/ good control.  Denies CP, SOB above baseline, HAs, visual changes, edema.  Hyperlipidemia- chronic problem, on Simvastatin 20mg  daily.  Denies abd pain, N/V.  Active but no formal exercise program.  Depression- chronic problem, on Cymbalta 60mg  and Alprazolam prn.  Pt reports she has had a difficult winter.   Review of Systems For ROS see HPI   This visit occurred during the SARS-CoV-2 public health emergency.  Safety protocols were in place, including screening questions prior to the visit, additional usage of staff PPE, and extensive cleaning of exam room while observing appropriate contact time as indicated for disinfecting solutions.       Objective:   Physical Exam Vitals reviewed.  Constitutional:      General: She is not in acute distress.    Appearance: Normal appearance. She is well-developed.  HENT:     Head: Normocephalic and atraumatic.  Eyes:     Conjunctiva/sclera: Conjunctivae normal.     Pupils: Pupils are equal, round, and reactive to light.  Neck:     Thyroid: No thyromegaly.  Cardiovascular:     Rate and Rhythm: Normal rate and regular rhythm.     Heart sounds: Normal heart sounds. No murmur.  Pulmonary:     Effort: Pulmonary effort is normal. No respiratory distress.     Breath sounds: Normal breath sounds.  Abdominal:     General: There is no distension.     Palpations: Abdomen is soft.     Tenderness: There is no abdominal tenderness.  Musculoskeletal:     Cervical back: Normal range of motion and neck supple.  Lymphadenopathy:     Cervical: No cervical adenopathy.  Skin:    General: Skin is warm and dry.  Neurological:     Mental Status: She is alert and oriented to person, place, and time.  Psychiatric:        Behavior: Behavior normal.           Assessment & Plan:

## 2019-04-27 NOTE — Assessment & Plan Note (Signed)
Chronic problem.  Adequate control.  Asymptomatic.  Check labs.  No anticipated med changes.  Will follow. 

## 2019-04-28 ENCOUNTER — Other Ambulatory Visit: Payer: Self-pay | Admitting: Family Medicine

## 2019-04-28 DIAGNOSIS — D649 Anemia, unspecified: Secondary | ICD-10-CM

## 2019-05-03 ENCOUNTER — Other Ambulatory Visit: Payer: Self-pay | Admitting: Family Medicine

## 2019-05-03 DIAGNOSIS — I10 Essential (primary) hypertension: Secondary | ICD-10-CM

## 2019-05-05 ENCOUNTER — Other Ambulatory Visit (INDEPENDENT_AMBULATORY_CARE_PROVIDER_SITE_OTHER): Payer: Medicare HMO

## 2019-05-05 DIAGNOSIS — D649 Anemia, unspecified: Secondary | ICD-10-CM | POA: Diagnosis not present

## 2019-05-05 LAB — FECAL OCCULT BLOOD, IMMUNOCHEMICAL: Fecal Occult Bld: POSITIVE — AB

## 2019-05-09 ENCOUNTER — Telehealth: Payer: Self-pay | Admitting: Family Medicine

## 2019-05-09 NOTE — Telephone Encounter (Signed)
Please advise 

## 2019-05-09 NOTE — Telephone Encounter (Signed)
Labs faxed

## 2019-05-09 NOTE — Telephone Encounter (Signed)
Lab called in stating that pt has a positive I-Fob.   Please advise

## 2019-05-09 NOTE — Telephone Encounter (Signed)
Pt needs the results to be sent to [facility], she sees Celso Amy.

## 2019-05-09 NOTE — Telephone Encounter (Signed)
This was addressed by Selena Batten while I was out and pt plans to f/u w/ GI

## 2019-05-09 NOTE — Telephone Encounter (Signed)
Noted  

## 2019-05-17 ENCOUNTER — Encounter: Payer: Self-pay | Admitting: Student

## 2019-05-17 ENCOUNTER — Ambulatory Visit: Payer: Medicare HMO | Admitting: Student

## 2019-05-17 ENCOUNTER — Other Ambulatory Visit: Payer: Self-pay

## 2019-05-17 VITALS — BP 142/60 | HR 64 | Ht 62.0 in | Wt 119.0 lb

## 2019-05-17 DIAGNOSIS — I10 Essential (primary) hypertension: Secondary | ICD-10-CM | POA: Diagnosis not present

## 2019-05-17 DIAGNOSIS — R0602 Shortness of breath: Secondary | ICD-10-CM

## 2019-05-17 DIAGNOSIS — R002 Palpitations: Secondary | ICD-10-CM

## 2019-05-17 LAB — BASIC METABOLIC PANEL
BUN/Creatinine Ratio: 12 (ref 12–28)
BUN: 12 mg/dL (ref 8–27)
CO2: 25 mmol/L (ref 20–29)
Calcium: 9.5 mg/dL (ref 8.7–10.3)
Chloride: 104 mmol/L (ref 96–106)
Creatinine, Ser: 0.97 mg/dL (ref 0.57–1.00)
GFR calc Af Amer: 68 mL/min/{1.73_m2} (ref >59)
GFR calc non Af Amer: 59 mL/min/{1.73_m2} — ABNORMAL LOW (ref >59)
Glucose: 96 mg/dL (ref 65–99)
Potassium: 4.6 mmol/L (ref 3.5–5.2)
Sodium: 142 mmol/L (ref 134–144)

## 2019-05-17 LAB — CBC
Hematocrit: 35 % (ref 34.0–46.6)
Hemoglobin: 11.6 g/dL (ref 11.1–15.9)
MCH: 27.4 pg (ref 26.6–33.0)
MCHC: 33.1 g/dL (ref 31.5–35.7)
MCV: 83 fL (ref 79–97)
Platelets: 266 10*3/uL (ref 150–450)
RBC: 4.23 x10E6/uL (ref 3.77–5.28)
RDW: 16.9 % — ABNORMAL HIGH (ref 11.7–15.4)
WBC: 3.5 10*3/uL (ref 3.4–10.8)

## 2019-05-17 NOTE — Patient Instructions (Addendum)
Medication Instructions:  none *If you need a refill on your cardiac medications before your next appointment, please call your pharmacy*   Lab Work:  TODAY BMET CBC If you have labs (blood work) drawn today and your tests are completely normal, you will receive your results only by: Marland Kitchen MyChart Message (if you have MyChart) OR . A paper copy in the mail If you have any lab test that is abnormal or we need to change your treatment, we will call you to review the results.   Testing/Procedures: Your physician has requested that you have an echocardiogram. Echocardiography is a painless test that uses sound waves to create images of your heart. It provides your doctor with information about the size and shape of your heart and how well your heart's chambers and valves are working. This procedure takes approximately one hour. There are no restrictions for this procedure.     Follow-Up: At [facility], you and your health needs are our priority.  As part of our continuing mission to provide you with exceptional heart care, we have created designated Provider Care Teams.  These Care Teams include your primary Cardiologist (physician) and Advanced Practice Providers (APPs -  Physician Assistants and Nurse Practitioners) who all work together to provide you with the care you need, when you need it.   Your next appointment:   6 months  The format for your next appointment:   Either In Person or Virtual  Provider:   Dr Ladona Ridgel   Other Instructions

## 2019-05-17 NOTE — Progress Notes (Signed)
PCP:  Sheliah Hatch, MD Primary Cardiologist: No primary care provider on file. Electrophysiologist: Dr. Eustace Pen Regina Gonzalez is a 74 y.o. female with past medical history of palpitations and SOB who presents today for routine electrophysiology followup. They are seen for Dr. Ladona Ridgel.   Since last being seen in our clinic, the patient reports doing poorly. She has had several issues with back pain and SOB. Her SOB is worse when lying down at night. She also c/o more SOB with her neck in flexion.  She is followed for anemia and recently had a +FOBT. Awaiting GI appointment.  COVID has done poorly for her mood per PCP notes, but hopeful that this will improve as the weather improves into the spring.   The patient feels that she is tolerating medications without difficulties and is otherwise without complaint today.   Past Medical History:  Diagnosis Date  . Allergic rhinitis   . Anxiety   . Arthritis    HANDS  . Benign liver cyst    multiple  . Chronic constipation   . Depression   . Diverticulosis of colon   . Emphysematous COPD (HCC)   . External hemorrhoid   . GERD (gastroesophageal reflux disease)   . History of colon polyps   . History of Helicobacter pylori infection   . History of TIA (transient ischemic attack)    apr 2013-- no residual  . Hyperlipidemia   . Occipital neuralgia of left side 12/08/2014  . Osteoporosis   . Pancreatic cyst    simple benign  . PONV (postoperative nausea and vomiting)   . Prolapsed internal hemorrhoids, grade 3   . Simple renal cyst    right   . Wears glasses   . Wears partial dentures    UPPER   Past Surgical History:  Procedure Laterality Date  . APPENDECTOMY  1960  . BREAST EXCISIONAL BIOPSY Left   . BREAST EXCISIONAL BIOPSY Right   . COLONOSCOPY WITH PROPOFOL  last one 10-25-2013   polypectomy  . D & C HYSTEROSCOPY W/ ENDOMETRIAL POLYPECTOMY    . ESOPHAGOGASTRODUODENOSCOPY  last one 03-04-2013  . EXCISION  BILATERAL BREAST CYST  1995  . HEMORRHOID SURGERY N/A 04/06/2014   Procedure: Hemmorhoidopexy with anal canal biopsy;  Surgeon: Romie Levee, MD;  Location: [facility];  Service: General;  Laterality: N/A;  . LAPAROSCOPIC RIGHT OVARIAN CYSTECTOMY/  D & C HYSTEROSCOPY ENDOMETRIAL POLYPECTOMY  2011  . TONSILLECTOMY AND ADENOIDECTOMY  1955  . TRANSTHORACIC ECHOCARDIOGRAM  08-13-2012   normal LV/  ef 60-65%/  mild AR/  trivial MR    Current Outpatient Medications  Medication Sig Dispense Refill  . ALPRAZolam (XANAX) 1 MG tablet TAKE 1/2 TO 1 TABLET BY MOUTH 2-3 TIMES DAILY AS NEEDED FOR ANXIETY 60 tablet 1  . Ascorbic Acid (VITAMIN C PO) Take by mouth daily.    . Calcium Carb-Cholecalciferol (CALCIUM 600 + D PO) Take 2 tablets by mouth daily.    . cyanocobalamin 500 MCG tablet Take 500 mcg by mouth daily.    Marland Kitchen dicyclomine (BENTYL) 20 MG tablet TAKE ONE TABLET BY MOUTH THREE TIMES DAILY BETWEEN MEALS AS NEEDED. 270 tablet 1  . Docusate Calcium (STOOL SOFTENER PO) Take by mouth.    . DULoxetine (CYMBALTA) 60 MG capsule TAKE 1 CAPSULE BY MOUTH EVERY DAY 90 capsule 3  . ferrous sulfate 325 (65 FE) MG tablet TAKE 1 TABLET (325 MG TOTAL) BY MOUTH DAILY WITH BREAKFAST. 30 tablet  2  . fluticasone (FLONASE) 50 MCG/ACT nasal spray Place 2 sprays into both nostrils daily. 16 g 6  . LINZESS 72 MCG capsule TAKE 1 CAPSULE ON AN EMPTY STOMACH ONCE A DAY    . loratadine (CLARITIN) 10 MG tablet Take 1 tablet (10 mg total) by mouth daily. 30 tablet 11  . losartan (COZAAR) 25 MG tablet TAKE ONE TABLET BY MOUTH DAILY 90 tablet 1  . Multiple Vitamins-Minerals (MULTIVITAMIN WITH MINERALS) tablet Take 1 tablet by mouth daily.     . pantoprazole (PROTONIX) 40 MG tablet TAKE 1 TABLET BY MOUTH EVERY DAY 90 tablet 2  . simvastatin (ZOCOR) 20 MG tablet TAKE 1 TABLET BY MOUTH EVERYDAY AT BEDTIME 90 tablet 1   No current facility-administered medications for this visit.    Allergies  Allergen Reactions    . Erythromycin Other (See Comments)    "severe GI upset, also all "mycins" meds   . Flagyl [Metronidazole] Other (See Comments)    "flu-like symptoms"    Social History   Socioeconomic History  . Marital status: Widowed    Spouse name: Carloyn Manner  . Number of children: 2  . Years of education: 89  . Highest education level: Not on file  Occupational History  . Occupation: [employer]     Employer: [employer]    Comment: Hair [employer]   Tobacco Use  . Smoking status: Former Smoker    Packs/day: 1.00    Years: 17.00    Pack years: 17.00    Types: Cigarettes    Quit date: 04/02/1988    Years since quitting: 31.1  . Smokeless tobacco: Never Used  Substance and Sexual Activity  . Alcohol use: No  . Drug use: No  . Sexual activity: Not on file  Other Topics Concern  . Not on file  Social History Narrative   Patient lives at home with her grandson.  Husband Carloyn Manner) died 2017/06/04 as did her daughter Ailene Ravel.  Works for [employer]. Patient has high school education and two children (1 deceased). Caffeine 2-3 cups daily.   Right handed    Social Determinants of Health   Financial Resource Strain:   . Difficulty of Paying Living Expenses:   Food Insecurity:   . Worried About Charity fundraiser in the Last Year:   . Arboriculturist in the Last Year:   Transportation Needs:   . Film/video editor (Medical):   Marland Kitchen Lack of Transportation (Non-Medical):   Physical Activity:   . Days of Exercise per Week:   . Minutes of Exercise per Session:   Stress:   . Feeling of Stress :   Social Connections:   . Frequency of Communication with Friends and Family:   . Frequency of Social Gatherings with Friends and Family:   . Attends Religious Services:   . Active Member of Clubs or Organizations:   . Attends Archivist Meetings:   Marland Kitchen Marital Status:   Intimate Partner Violence:   . Fear of Current or Ex-Partner:   . Emotionally Abused:   Marland Kitchen Physically  Abused:   . Sexually Abused:      Review of Systems: General: No chills, fever, night sweats or weight changes  Cardiovascular:  No chest pain, dyspnea on exertion, edema, orthopnea, palpitations, paroxysmal nocturnal dyspnea Dermatological: No rash, lesions or masses Respiratory: No cough, dyspnea Urologic: No hematuria, dysuria Abdominal: No nausea, vomiting, diarrhea, bright red blood per  rectum, melena, or hematemesis Neurologic: No visual changes, weakness, changes in mental status All other systems reviewed and are otherwise negative except as noted above.  Physical Exam: Vitals:   05/17/19 0846  BP: (!) 142/60  Pulse: 64  SpO2: 98%  Weight: 119 lb (54 kg)  Height: 5\' 2"  (1.575 m)    GEN- The patient is well appearing, alert and oriented x 3 today. HEENT: normocephalic, atraumatic; sclera clear, conjunctiva pink; hearing intact; oropharynx clear; neck supple, no JVP Lymph- no cervical lymphadenopathy Lungs- Clear to ausculation bilaterally, normal work of breathing.  No wheezes, rales, rhonchi Heart- Regular rate and rhythm, no murmurs, rubs or gallops, PMI not laterally displaced GI- soft, non-tender, non-distended, bowel sounds present, no hepatosplenomegaly Extremities- no clubbing, cyanosis, or edema; DP/PT/radial pulses 2+ bilaterally MS- no significant deformity or atrophy Skin- warm and dry, no rash or lesion Psych- Anxious mood, full affect Neuro- strength and sensation are intact  EKG is ordered. Personal review of EKG from today shows NSR at 64 bpm with normal intervals  Assessment and Plan:  1. Palpitations Unclear etiology.  Only occasional symptoms; have previously discussed monitoring. Remains only occasional.   2. SOB Will repeat echo for completeness. (Echo 08/2012 LVEF 60-65%) Normal TSH 04/27/19. Recent + FOBT. Awaiting work up. Check CBC today.  I encouraged her that apart from mild orthopnea at times, her other symptoms do not appear to be  cardiac related.   Labs today. I hope that her symptoms will improve as she is able to be more active as COVID restrictions lax and the weather improves.  Will get labwork today and Echo for completeness. RTC 6 months.   04/29/19, PA-C  05/17/19 8:58 AM

## 2019-05-25 ENCOUNTER — Telehealth: Payer: Self-pay | Admitting: Family Medicine

## 2019-05-25 NOTE — Progress Notes (Signed)
  Chronic Care Management   Outreach Note  05/25/2019 Name: Regina Gonzalez MRN: [id number] DOB: February 08, 1945  Referred by: Sheliah Hatch, MD Reason for referral : No chief complaint on file.   An unsuccessful telephone outreach was attempted today. The patient was referred to the pharmacist for assistance with care management and care coordination.   Follow Up Plan:   Lynnae January Upstream Scheduler

## 2019-05-26 ENCOUNTER — Other Ambulatory Visit: Payer: Self-pay

## 2019-05-26 ENCOUNTER — Ambulatory Visit
Admission: RE | Admit: 2019-05-26 | Discharge: 2019-05-26 | Disposition: A | Discharge status: Home / Self Care | Payer: Medicare HMO | Source: Ambulatory Visit | Attending: Family Medicine | Admitting: Family Medicine

## 2019-05-26 DIAGNOSIS — Z1231 Encounter for screening mammogram for malignant neoplasm of breast: Secondary | ICD-10-CM

## 2019-06-01 ENCOUNTER — Other Ambulatory Visit (HOSPITAL_COMMUNITY): Payer: Medicare HMO

## 2019-06-01 ENCOUNTER — Ambulatory Visit: Payer: Medicare HMO | Admitting: Internal Medicine

## 2019-06-01 ENCOUNTER — Encounter (HOSPITAL_COMMUNITY): Payer: Self-pay

## 2019-06-06 ENCOUNTER — Telehealth: Payer: Self-pay | Admitting: Family Medicine

## 2019-06-06 NOTE — Telephone Encounter (Addendum)
Patient notified of PCP recommendations. She advised that [facility] has scheduled her a colonoscopy and wanted to know if ok to put this off. I advised that she should get the colonoscopy as scheduled as the GI doctors have protocols they follow. Pt is in agreement and expresses an understanding.

## 2019-06-06 NOTE — Telephone Encounter (Signed)
Pt called asking if Dr. Beverely Low could write her an order to get another test done to see if blood is still present in her stool. Please advise.

## 2019-06-06 NOTE — Telephone Encounter (Signed)
Pt was advised of positive IFOB and told to contact [facility]. Do not see where pt has done this yet. Please advise?

## 2019-06-06 NOTE — Telephone Encounter (Signed)
There is no need to repeat the test at this time.  She must follow up w/ [facility] as there was blood in the stool and her hemoglobin was low.

## 2019-06-21 ENCOUNTER — Other Ambulatory Visit: Payer: Self-pay

## 2019-06-21 ENCOUNTER — Telehealth: Payer: Self-pay | Admitting: Family Medicine

## 2019-06-21 MED ORDER — DICYCLOMINE HCL 20 MG PO TABS: ORAL_TABLET | ORAL | 1 refills | Status: DC

## 2019-06-21 NOTE — Telephone Encounter (Signed)
Prescription sent to patient's pharmacy.

## 2019-06-21 NOTE — Telephone Encounter (Signed)
Pt called in asking for a new script of the dicyclomine to be sent to the [facility] [address]. Please advise she changed to this pharmacy

## 2019-06-23 ENCOUNTER — Other Ambulatory Visit: Payer: Self-pay | Admitting: Gastroenterology

## 2019-06-23 DIAGNOSIS — R109 Unspecified abdominal pain: Secondary | ICD-10-CM

## 2019-06-23 DIAGNOSIS — K3189 Other diseases of stomach and duodenum: Secondary | ICD-10-CM

## 2019-06-28 ENCOUNTER — Ambulatory Visit
Admission: RE | Admit: 2019-06-28 | Discharge: 2019-06-28 | Disposition: A | Discharge status: Home / Self Care | Payer: Medicare HMO | Source: Ambulatory Visit | Attending: Gastroenterology | Admitting: Gastroenterology

## 2019-06-28 DIAGNOSIS — R109 Unspecified abdominal pain: Secondary | ICD-10-CM

## 2019-06-28 DIAGNOSIS — K3189 Other diseases of stomach and duodenum: Secondary | ICD-10-CM

## 2019-06-28 MED ORDER — IOPAMIDOL (ISOVUE-300) INJECTION 61%
100.0000 mL | Freq: Once | INTRAVENOUS | Status: AC | PRN
  Administered 2019-06-28: 100 mL via INTRAVENOUS

## 2019-07-06 ENCOUNTER — Other Ambulatory Visit: Payer: Medicare HMO

## 2019-08-03 ENCOUNTER — Other Ambulatory Visit: Payer: Self-pay | Admitting: Family Medicine

## 2019-08-03 ENCOUNTER — Other Ambulatory Visit: Payer: Self-pay

## 2019-08-03 ENCOUNTER — Encounter: Payer: Self-pay | Admitting: Family Medicine

## 2019-08-03 ENCOUNTER — Ambulatory Visit (INDEPENDENT_AMBULATORY_CARE_PROVIDER_SITE_OTHER): Payer: Medicare HMO | Admitting: Family Medicine

## 2019-08-03 VITALS — BP 118/78 | HR 79 | Temp 97.8°F | Resp 16 | Ht 61.0 in | Wt 115.0 lb

## 2019-08-03 DIAGNOSIS — I10 Essential (primary) hypertension: Secondary | ICD-10-CM | POA: Diagnosis not present

## 2019-08-03 DIAGNOSIS — K219 Gastro-esophageal reflux disease without esophagitis: Secondary | ICD-10-CM

## 2019-08-03 DIAGNOSIS — R110 Nausea: Secondary | ICD-10-CM

## 2019-08-03 DIAGNOSIS — R5383 Other fatigue: Secondary | ICD-10-CM | POA: Diagnosis not present

## 2019-08-03 LAB — CBC WITH DIFFERENTIAL/PLATELET
Basophils Absolute: 0 10*3/uL (ref 0.0–0.1)
Basophils Relative: 1 % (ref 0.0–3.0)
Eosinophils Absolute: 0.5 10*3/uL (ref 0.0–0.7)
Eosinophils Relative: 12.6 % — ABNORMAL HIGH (ref 0.0–5.0)
HCT: 36.7 % (ref 36.0–46.0)
Hemoglobin: 12.1 g/dL (ref 12.0–15.0)
Lymphocytes Relative: 35.5 % (ref 12.0–46.0)
Lymphs Abs: 1.3 10*3/uL (ref 0.7–4.0)
MCHC: 33.1 g/dL (ref 30.0–36.0)
MCV: 86.2 fl (ref 78.0–100.0)
Monocytes Absolute: 0.3 10*3/uL (ref 0.1–1.0)
Monocytes Relative: 9.2 % (ref 3.0–12.0)
Neutro Abs: 1.5 10*3/uL (ref 1.4–7.7)
Neutrophils Relative %: 41.7 % — ABNORMAL LOW (ref 43.0–77.0)
Platelets: 206 10*3/uL (ref 150.0–400.0)
RBC: 4.25 Mil/uL (ref 3.87–5.11)
RDW: 18.2 % — ABNORMAL HIGH (ref 11.5–15.5)
WBC: 3.7 10*3/uL — ABNORMAL LOW (ref 4.0–10.5)

## 2019-08-03 LAB — BASIC METABOLIC PANEL
BUN: 8 mg/dL (ref 6–23)
CO2: 31 mEq/L (ref 19–32)
Calcium: 9.5 mg/dL (ref 8.4–10.5)
Chloride: 102 mEq/L (ref 96–112)
Creatinine, Ser: 0.91 mg/dL (ref 0.40–1.20)
GFR: 60.76 mL/min (ref >60.00)
Glucose, Bld: 104 mg/dL — ABNORMAL HIGH (ref 70–99)
Potassium: 3.8 mEq/L (ref 3.5–5.1)
Sodium: 140 mEq/L (ref 135–145)

## 2019-08-03 LAB — HEPATIC FUNCTION PANEL
ALT: 11 U/L (ref 0–35)
AST: 17 U/L (ref 0–37)
Albumin: 4.1 g/dL (ref 3.5–5.2)
Alkaline Phosphatase: 49 U/L (ref 39–117)
Bilirubin, Direct: 0.1 mg/dL (ref 0.0–0.3)
Total Bilirubin: 0.4 mg/dL (ref 0.2–1.2)
Total Protein: 6 g/dL (ref 6.0–8.3)

## 2019-08-03 LAB — TSH: TSH: 1.13 u[IU]/mL (ref 0.35–4.50)

## 2019-08-03 LAB — LIPASE: Lipase: 41 U/L (ref 11.0–59.0)

## 2019-08-03 LAB — AMYLASE: Amylase: 60 U/L (ref 27–131)

## 2019-08-03 MED ORDER — ALPRAZOLAM 1 MG PO TABS: ORAL_TABLET | ORAL | 1 refills | Status: DC

## 2019-08-03 MED ORDER — LOSARTAN POTASSIUM 25 MG PO TABS: 25.0000 mg | ORAL_TABLET | Freq: Every day | ORAL | 1 refills | Status: DC

## 2019-08-03 MED ORDER — ONDANSETRON HCL 4 MG PO TABS: 4.0000 mg | ORAL_TABLET | Freq: Three times a day (TID) | ORAL | 0 refills | Status: DC | PRN

## 2019-08-03 MED ORDER — PANTOPRAZOLE SODIUM 40 MG PO TBEC: 40.0000 mg | DELAYED_RELEASE_TABLET | Freq: Two times a day (BID) | ORAL | 0 refills | Status: DC

## 2019-08-03 NOTE — Assessment & Plan Note (Signed)
Pt states GERD is unchanged but then reports increased burning in her stomach.  Will increase Protonix to BID and check for H pylori (pt has a hx of this)

## 2019-08-03 NOTE — Patient Instructions (Signed)
Follow up as needed or as scheduled We'll notify you of your lab results and make any changes if needed DOUBLE the Protonix to twice daily USE the Ondansetron (Zofran) as needed for nausea Make sure you are eating small amounts regularly throughout the day Drink plenty of fluids Call with any questions or concerns Hang in there!

## 2019-08-03 NOTE — Progress Notes (Signed)
   Subjective:    Patient ID: Regina Gonzalez, female    DOB: 09-18-1945, 74 y.o.   MRN: [id number]  HPI 'I have so many questions for you'- pt reports she was having 'stomach issues', mostly chronic constipation.  Pt reports after her colonoscopy/endoscopy [facility]) she had 'tarry stools' that went away.  Since then she has had 'lots of mucous' in stool.  Has had intermittent nausea, made herself vomit x1.  She says sxs will 'come and go'.  Stopped her iron pills.  Reports fatigue, 'feeling winded', nauseous.  Had virtual visit w/ Dr Dulce Sellar who gave her a very specific gluten free/dairy free diet.  Had CT abd/pelvis that showed multiple pancreatic cysts.  Reports GERD sxs have not changed, 'but I have a little bit of burning in my stomach'.  'I feel like I have a bad virus'.  Denies increased stress.  Says she has felt sick for 'over a month'.  'i'm just so tired'.     Review of Systems For ROS see HPI   This visit occurred during the SARS-CoV-2 public health emergency.  Safety protocols were in place, including screening questions prior to the visit, additional usage of staff PPE, and extensive cleaning of exam room while observing appropriate contact time as indicated for disinfecting solutions.       Objective:   Physical Exam Vitals reviewed.  Constitutional:      General: She is not in acute distress.    Appearance: Normal appearance. She is well-developed.  HENT:     Head: Normocephalic and atraumatic.  Eyes:     Conjunctiva/sclera: Conjunctivae normal.     Pupils: Pupils are equal, round, and reactive to light.  Neck:     Thyroid: No thyromegaly.  Cardiovascular:     Rate and Rhythm: Normal rate and regular rhythm.     Heart sounds: Normal heart sounds. No murmur heard.   Pulmonary:     Effort: Pulmonary effort is normal. No respiratory distress.     Breath sounds: Normal breath sounds.  Abdominal:     General: There is no distension.     Palpations: Abdomen is  soft.     Tenderness: There is no abdominal tenderness.  Musculoskeletal:     Cervical back: Normal range of motion and neck supple.  Lymphadenopathy:     Cervical: No cervical adenopathy.  Skin:    General: Skin is warm and dry.     Coloration: Skin is pale.  Neurological:     Mental Status: She is alert and oriented to person, place, and time.  Psychiatric:        Behavior: Behavior normal.           Assessment & Plan:  Fatigue- pt has hx of this but reports the current duration of fatigue is unusual for her.  She is very concerned that something is wrong and this has her anxiety level quite high.  Check labs to assess for metabolic cause.  Will follow.  Nausea- new.  Pt reports a burning in her stomach.  Will double PPI to BID.  Pt to use Zofran prn.  Pt just had GI workup- endoscopy and colonoscopy.  Will check labs to assess for underlying cause.  Pt's recent CT scan showed multiple pancreatic cysts.  Will get Amylase and Lipase to assess.

## 2019-08-03 NOTE — Assessment & Plan Note (Signed)
Refill on Losartan provided 

## 2019-08-04 ENCOUNTER — Telehealth: Payer: Self-pay | Admitting: Family Medicine

## 2019-08-04 NOTE — Telephone Encounter (Signed)
Pt called with questions about her WBC count and her RBC count? She wanted to know if she should be concerned about those levels? Please advise

## 2019-08-04 NOTE — Telephone Encounter (Signed)
Please advise 

## 2019-08-04 NOTE — Telephone Encounter (Signed)
Her counts are fine.  I will definitely let her know if there's something to worry about.  She doesn't need to worry.

## 2019-08-04 NOTE — Telephone Encounter (Signed)
Called and advised pt of PCP recommendations. She stated an understanding.

## 2019-08-05 LAB — H PYLORI, IGM, IGG, IGA AB
H pylori, IgM Abs: <9 units (ref 0.0–8.9)
H. pylori, IgA Abs: <9 units (ref 0.0–8.9)
H. pylori, IgG AbS: 1.24 Index Value — ABNORMAL HIGH (ref 0.00–0.79)

## 2019-08-05 MED ORDER — BISMUTH SUBSALICYLATE 262 MG PO TABS: 2.0000 | ORAL_TABLET | Freq: Three times a day (TID) | ORAL | 0 refills | Status: AC

## 2019-08-05 MED ORDER — METRONIDAZOLE 500 MG PO TABS
500.0000 mg | ORAL_TABLET | Freq: Three times a day (TID) | ORAL | 0 refills | Status: AC
Start: 2019-08-05 — End: 2019-08-19

## 2019-08-05 MED ORDER — DOXYCYCLINE HYCLATE 100 MG PO TABS
100.0000 mg | ORAL_TABLET | Freq: Two times a day (BID) | ORAL | 0 refills | Status: AC
Start: 2019-08-05 — End: 2019-08-19

## 2019-08-05 NOTE — Addendum Note (Signed)
Addended by: Geannie Risen on: 08/05/2019 09:33 AM   Modules accepted: Orders

## 2019-08-09 ENCOUNTER — Telehealth: Payer: Self-pay | Admitting: Family Medicine

## 2019-08-09 NOTE — Telephone Encounter (Signed)
Regina Gonzalez advised medications are ok to take together. Patient advised and she has been following bland diet.  Patient wanted to know if she can take the Zofran and doxycycline together.    Spoke with patient about her episode of vomiting. She states she is only having the nausea and vomiting in the morning after the 1st dose of doxycycline.

## 2019-08-09 NOTE — Telephone Encounter (Signed)
Dr. Beverely Low is treating patient for H-Pylora - patient states that she vomited this morning and she feels like it was the medicine.  Should she take something for nausea? - Please advise

## 2019-08-15 ENCOUNTER — Telehealth: Payer: Self-pay | Admitting: Family Medicine

## 2019-08-15 NOTE — Telephone Encounter (Signed)
Patient notified of PCP recommendations and is agreement and expresses an understanding.  

## 2019-08-15 NOTE — Telephone Encounter (Signed)
Pt called in stating that she could only stand to take 10 days of the doxycycline, she states that the medication was making her sick to her stomach, she was unsteady and just didn't feel well. Pt wanted to know if she needs to be checked again. She states she is sorry that she couldn't take the full prescription but it just made her to sick. Please advise.

## 2019-08-15 NOTE — Telephone Encounter (Signed)
10 days should be sufficient.  We will see if her symptoms improve once the Doxycycline is out of her system.  It is too early (and not recommended) to do a test of cure.

## 2019-08-15 NOTE — Telephone Encounter (Signed)
Please advise 

## 2019-08-19 ENCOUNTER — Telehealth: Payer: Self-pay | Admitting: Family Medicine

## 2019-08-19 NOTE — Telephone Encounter (Signed)
Patient called about still feeling tired even after sleeping.  She just wanted to let you know because she feels like the medicine should be out of her system by now.  Please advise

## 2019-08-19 NOTE — Telephone Encounter (Signed)
She needs to drink plenty of fluids, make sure she is eating well, going to bed at a regular time and waking at a regular time, and if fatigue doesn't improve, we will need an OV to discuss

## 2019-08-19 NOTE — Telephone Encounter (Signed)
Please advise 

## 2019-10-11 ENCOUNTER — Ambulatory Visit (INDEPENDENT_AMBULATORY_CARE_PROVIDER_SITE_OTHER): Payer: Medicare HMO

## 2019-10-11 ENCOUNTER — Other Ambulatory Visit: Payer: Self-pay

## 2019-10-11 DIAGNOSIS — Z23 Encounter for immunization: Secondary | ICD-10-CM

## 2019-10-31 ENCOUNTER — Other Ambulatory Visit: Payer: Self-pay

## 2019-10-31 ENCOUNTER — Encounter: Payer: Self-pay | Admitting: Family Medicine

## 2019-10-31 ENCOUNTER — Ambulatory Visit (INDEPENDENT_AMBULATORY_CARE_PROVIDER_SITE_OTHER): Payer: Medicare HMO | Admitting: Family Medicine

## 2019-10-31 VITALS — BP 121/80 | HR 76 | Temp 97.7°F | Resp 16 | Ht 61.0 in | Wt 113.4 lb

## 2019-10-31 DIAGNOSIS — I10 Essential (primary) hypertension: Secondary | ICD-10-CM

## 2019-10-31 DIAGNOSIS — Z0000 Encounter for general adult medical examination without abnormal findings: Secondary | ICD-10-CM | POA: Diagnosis not present

## 2019-10-31 LAB — BASIC METABOLIC PANEL
BUN: 23 mg/dL (ref 6–23)
CO2: 31 mEq/L (ref 19–32)
Calcium: 9.6 mg/dL (ref 8.4–10.5)
Chloride: 102 mEq/L (ref 96–112)
Creatinine, Ser: 0.98 mg/dL (ref 0.40–1.20)
GFR: 55.74 mL/min — ABNORMAL LOW (ref >60.00)
Glucose, Bld: 83 mg/dL (ref 70–99)
Potassium: 4.4 mEq/L (ref 3.5–5.1)
Sodium: 140 mEq/L (ref 135–145)

## 2019-10-31 LAB — CBC WITH DIFFERENTIAL/PLATELET
Basophils Absolute: 0 10*3/uL (ref 0.0–0.1)
Basophils Relative: 1.2 % (ref 0.0–3.0)
Eosinophils Absolute: 0.2 10*3/uL (ref 0.0–0.7)
Eosinophils Relative: 4.7 % (ref 0.0–5.0)
HCT: 36.7 % (ref 36.0–46.0)
Hemoglobin: 12.2 g/dL (ref 12.0–15.0)
Lymphocytes Relative: 38.6 % (ref 12.0–46.0)
Lymphs Abs: 1.5 10*3/uL (ref 0.7–4.0)
MCHC: 33.2 g/dL (ref 30.0–36.0)
MCV: 90.7 fl (ref 78.0–100.0)
Monocytes Absolute: 0.3 10*3/uL (ref 0.1–1.0)
Monocytes Relative: 8.4 % (ref 3.0–12.0)
Neutro Abs: 1.8 10*3/uL (ref 1.4–7.7)
Neutrophils Relative %: 47.1 % (ref 43.0–77.0)
Platelets: 240 10*3/uL (ref 150.0–400.0)
RBC: 4.04 Mil/uL (ref 3.87–5.11)
RDW: 14.9 % (ref 11.5–15.5)
WBC: 3.9 10*3/uL — ABNORMAL LOW (ref 4.0–10.5)

## 2019-10-31 LAB — LIPID PANEL
Cholesterol: 248 mg/dL — ABNORMAL HIGH (ref 0–200)
HDL: 82 mg/dL (ref >39.00)
LDL Cholesterol: 154 mg/dL — ABNORMAL HIGH (ref 0–99)
NonHDL: 166.29
Total CHOL/HDL Ratio: 3
Triglycerides: 63 mg/dL (ref 0.0–149.0)
VLDL: 12.6 mg/dL (ref 0.0–40.0)

## 2019-10-31 LAB — HEPATIC FUNCTION PANEL
ALT: 11 U/L (ref 0–35)
AST: 17 U/L (ref 0–37)
Albumin: 4.3 g/dL (ref 3.5–5.2)
Alkaline Phosphatase: 50 U/L (ref 39–117)
Bilirubin, Direct: 0.1 mg/dL (ref 0.0–0.3)
Total Bilirubin: 0.3 mg/dL (ref 0.2–1.2)
Total Protein: 6.3 g/dL (ref 6.0–8.3)

## 2019-10-31 LAB — TSH: TSH: 3.01 u[IU]/mL (ref 0.35–4.50)

## 2019-10-31 NOTE — Assessment & Plan Note (Signed)
Pt's PE WNL.  UTD on mammo, colonoscopy, DEXA, PNA vaccines, flu vaccine, COVID vaccine.  Encouraged her to get COVID booster.  Check labs.  Anticipatory guidance provided.

## 2019-10-31 NOTE — Progress Notes (Signed)
   Subjective:    Patient ID: Regina Gonzalez, female    DOB: [birth year], 74 y.o.   MRN: [id number]  HPI CPE- UTD on mammo, colonoscopy, DEXA, pneumonia vaccines, flu vaccine, COVID vaccine  Reviewed past medical, surgical, family and social histories.   Patient Care Team    Relationship Specialty Notifications Start End  Sheliah Hatch, MD PCP - General   01/09/10    Comment: Concepcion Living, MD Consulting Physician Gastroenterology  09/15/14   Romie Levee, MD Consulting Physician General Surgery  09/15/14   Waldon Merl, PA-C Physician Assistant Family Medicine  11/04/18   Celso Amy, PA-C Physician Assistant Physician Assistant  05/09/19     Health Maintenance  Topic Date Due  . Janet Berlin  04/26/2020 (Originally 02/03/2018)  . MAMMOGRAM  05/25/2020  . COLONOSCOPY  06/22/2029  . INFLUENZA VACCINE  Completed  . DEXA SCAN  Completed  . COVID-19 Vaccine  Completed  . Hepatitis C Screening  Completed  . PNA vac Low Risk Adult  Completed      Review of Systems Patient reports no vision/ hearing changes, adenopathy,fever, weight change,  persistant/recurrent hoarseness , swallowing issues, chest pain, palpitations, edema, persistant/recurrent cough, hemoptysis, dyspnea (rest/exertional/paroxysmal nocturnal), gastrointestinal bleeding (melena, rectal bleeding), abdominal pain, significant heartburn, bowel changes, GU symptoms (dysuria, hematuria, incontinence), Gyn symptoms (abnormal  bleeding, pain),  syncope, focal weakness, memory loss, numbness & tingling, skin/hair/nail changes, abnormal bruising or bleeding, anxiety, or depression.  This visit occurred during the SARS-CoV-2 public health emergency.  Safety protocols were in place, including screening questions prior to the visit, additional usage of staff PPE, and extensive cleaning of exam room while observing appropriate contact time as indicated for disinfecting solutions.       Objective:    Physical Exam General Appearance:    Alert, cooperative, no distress, appears stated age  Head:    Normocephalic, without obvious abnormality, atraumatic  Eyes:    PERRL, conjunctiva/corneas clear, EOM's intact, fundi    benign, both eyes  Ears:    Normal TM's and external ear canals, both ears  Nose:   Deferred due to COVID  Throat:   Neck:   Supple, symmetrical, trachea midline, no adenopathy;    Thyroid: no enlargement/tenderness/nodules  Back:     Symmetric, no curvature, ROM normal, no CVA tenderness  Lungs:     Clear to auscultation bilaterally, respirations unlabored  Chest Wall:    No tenderness or deformity   Heart:    Regular rate and rhythm, S1 and S2 normal, no murmur, rub   or gallop  Breast Exam:    Deferred to mammo  Abdomen:     Soft, non-tender, bowel sounds active all four quadrants,    no masses, no organomegaly  Genitalia:    Deferred  Rectal:    Extremities:   Extremities normal, atraumatic, no cyanosis or edema  Pulses:   2+ and symmetric all extremities  Skin:   Skin color, texture, turgor normal, no rashes or lesions  Lymph nodes:   Cervical, supraclavicular, and axillary nodes normal  Neurologic:   CNII-XII intact, normal strength, sensation and reflexes    throughout          Assessment & Plan:

## 2019-10-31 NOTE — Assessment & Plan Note (Signed)
Chronic problem.  Well controlled on Losartan 25mg  daily.  Check labs.  No anticipated med changes.

## 2019-10-31 NOTE — Patient Instructions (Addendum)
Follow up in 6 months to recheck BP and cholesterol We'll notify you of your lab results and make any changes if needed Get your COVID booster at your convenience Let me know which Alprazolam (Xanax) to refill- 0.5mg  or 1 mg Keep up the good work!  You look great! Call with any questions or concerns Stay Safe!  Stay Healthy!

## 2019-11-01 ENCOUNTER — Telehealth: Payer: Self-pay | Admitting: Family Medicine

## 2019-11-01 NOTE — Telephone Encounter (Signed)
Spoke with pt in regards to her blood work. She asked if she should be concerned with her GFR level. I advised that as she gets older this number can decrease and also advised her to increase her water intake. Pt admits she is not the best at staying hydrated.

## 2019-11-01 NOTE — Telephone Encounter (Signed)
Patient has a question about her lab results from yesterday.  Please call

## 2019-11-01 NOTE — Telephone Encounter (Signed)
Agree w/ advice given 

## 2019-11-10 NOTE — Progress Notes (Signed)
Subjective:   Regina Gonzalez is a 74 y.o. female who presents for Medicare Annual (Subsequent) preventive examination.  I connected with Kyleena today by telephone and verified that I am speaking with the correct person using two identifiers. Location patient: home Location provider: work Persons participating in the virtual visit: patient, Engineer, civil (consulting).    I discussed the limitations, risks, security and privacy concerns of performing an evaluation and management service by telephone and the availability of in person appointments. I also discussed with the patient that there may be a patient responsible charge related to this service. The patient expressed understanding and verbally consented to this telephonic visit.    Interactive audio and video telecommunications were attempted between this provider and patient, however failed, due to patient having technical difficulties OR patient did not have access to video capability.  We continued and completed visit with audio only.  Some vital signs may be absent or patient reported.   Time Spent with patient on telephone encounter: 25 minutes  Review of Systems     Cardiac Risk Factors include: dyslipidemia;hypertension     Objective:    Today's Vitals   11/14/19 1246  Weight: 113 lb (51.3 kg)  Height: 5\' 1"  (1.549 m)   Body mass index is 21.35 kg/m.  Advanced Directives 11/14/2019 01/03/2018 09/25/2016 04/06/2014 12/25/2013  Does Patient Have a Medical Advance Directive? Yes Yes Yes No No  Type of 12/27/2013 of [place];Living will Healthcare Power of [place];Living will Healthcare Power of [place];Living will - -  Copy of Healthcare Power of Attorney in Chart? No - copy requested - No - copy requested - -  Would patient like information on creating a medical advance directive? - - - No - patient declined information No - patient declined information    Current Medications (verified) Outpatient  Encounter Medications as of 11/14/2019  Medication Sig  . ALPRAZolam (XANAX) 0.5 MG tablet 1-2  tablets  . Ascorbic Acid (VITAMIN C PO) Take by mouth daily.  . Calcium Carb-Cholecalciferol (CALCIUM 600 + D PO) Take 2 tablets by mouth daily.  . cyanocobalamin 500 MCG tablet Take 500 mcg by mouth daily.  01/14/2020 dicyclomine (BENTYL) 10 MG capsule 1 capsule  . Docusate Calcium (STOOL SOFTENER PO) Take by mouth.  . DULoxetine (CYMBALTA) 60 MG capsule TAKE 1 CAPSULE BY MOUTH EVERY DAY  . ferrous sulfate 325 (65 FE) MG tablet TAKE 1 TABLET (325 MG TOTAL) BY MOUTH DAILY WITH BREAKFAST.  . fluticasone (FLONASE) 50 MCG/ACT nasal spray Place 2 sprays into both nostrils daily.  Marland Kitchen LINZESS 72 MCG capsule TAKE 1 CAPSULE ON AN EMPTY STOMACH ONCE A DAY  . loratadine (CLARITIN) 10 MG tablet Take 1 tablet (10 mg total) by mouth daily.  Marland Kitchen losartan (COZAAR) 25 MG tablet Take 1 tablet (25 mg total) by mouth daily.  . Multiple Vitamins-Minerals (MULTIVITAMIN WITH MINERALS) tablet Take 1 tablet by mouth daily.   . ondansetron (ZOFRAN) 4 MG tablet Take 1 tablet (4 mg total) by mouth every 8 (eight) hours as needed for nausea or vomiting.  . pantoprazole (PROTONIX) 40 MG tablet Take 1 tablet (40 mg total) by mouth 2 (two) times daily.  . simvastatin (ZOCOR) 20 MG tablet TAKE 1 TABLET BY MOUTH EVERYDAY AT BEDTIME   No facility-administered encounter medications on file as of 11/14/2019.    Allergies (verified) Erythromycin and Flagyl [metronidazole]   History: Past Medical History:  Diagnosis Date  . Allergic rhinitis   . Anxiety   .  Arthritis    HANDS  . Benign liver cyst    multiple  . Chronic constipation   . Depression   . Diverticulosis of colon   . Emphysematous COPD (HCC)   . External hemorrhoid   . GERD (gastroesophageal reflux disease)   . History of colon polyps   . History of Helicobacter pylori infection   . History of TIA (transient ischemic attack)    apr 2013-- no residual  .  Hyperlipidemia   . Occipital neuralgia of left side 12/08/2014  . Osteoporosis   . Pancreatic cyst    simple benign  . PONV (postoperative nausea and vomiting)   . Prolapsed internal hemorrhoids, grade 3   . Simple renal cyst    right   . Wears glasses   . Wears partial dentures    UPPER   Past Surgical History:  Procedure Laterality Date  . APPENDECTOMY  1960  . BREAST EXCISIONAL BIOPSY Left   . BREAST EXCISIONAL BIOPSY Right   . COLONOSCOPY WITH PROPOFOL  last one 10-25-2013   polypectomy  . D & C HYSTEROSCOPY W/ ENDOMETRIAL POLYPECTOMY    . ESOPHAGOGASTRODUODENOSCOPY  last one 03-04-2013  . EXCISION BILATERAL BREAST CYST  1995  . HEMORRHOID SURGERY N/A 04/06/2014   Procedure: Hemmorhoidopexy with anal canal biopsy;  Surgeon: Romie LeveeAlicia Thomas, MD;  [facility];  Service: General;  Laterality: N/A;  . LAPAROSCOPIC RIGHT OVARIAN CYSTECTOMY/  D & C HYSTEROSCOPY ENDOMETRIAL POLYPECTOMY  2011  . TONSILLECTOMY AND ADENOIDECTOMY  1955  . TRANSTHORACIC ECHOCARDIOGRAM  08-13-2012   normal LV/  ef 60-65%/  mild AR/  trivial MR   Family History  Problem Relation Age of Onset  . Alcoholism Father   . Diabetes type II Brother   . Heart attack Brother   . Cirrhosis Mother        non-alcoholic  . Multiple sclerosis Daughter   . Pancreatitis Daughter   . Colon cancer Neg Hx    Social History   Socioeconomic History  . Marital status: Widowed    Spouse name: Channing MuttersRoy  . Number of children: 2  . Years of education: 1812  . Highest education level: Not on file  Occupational History  . Occupation: retired  Tobacco Use  . Smoking status: Former Smoker    Packs/day: 1.00    Years: 17.00    Pack years: 17.00    Types: Cigarettes    Quit date: 04/02/1988    Years since quitting: 31.6  . Smokeless tobacco: Never Used  Vaping Use  . Vaping Use: Never used  Substance and Sexual Activity  . Alcohol use: No  . Drug use: No  . Sexual activity: Not on file  Other  Topics Concern  . Not on file  Social History Narrative   Patient lives at home with her grandson.  Husband Channing Mutters(Roy) died 2019 as did her daughter Sharyl NimrodMeredith.  Works for [employer]. Patient has high school education and two children (1 deceased). Caffeine 2-3 cups daily.   Right handed    Social Determinants of Health   Financial Resource Strain: Low Risk   . Difficulty of Paying Living Expenses: Not hard at all  Food Insecurity: No Food Insecurity  . Worried About Programme researcher, broadcasting/film/videounning Out of Food in the Last Year: Never true  . Ran Out of Food in the Last Year: Never true  Transportation Needs: No Transportation Needs  . Lack of Transportation (Medical): No  . Lack of  Transportation (Non-Medical): No  Physical Activity: Insufficiently Active  . Days of Exercise per Week: 7 days  . Minutes of Exercise per Session: 20 min  Stress: No Stress Concern Present  . Feeling of Stress : Not at all  Social Connections: Moderately Integrated  . Frequency of Communication with Friends and Family: More than three times a week  . Frequency of Social Gatherings with Friends and Family: More than three times a week  . Attends Religious Services: More than 4 times per year  . Active Member of Clubs or Organizations: Yes  . Attends Banker Meetings: More than 4 times per year  . Marital Status: Widowed    Tobacco Counseling Counseling given: Not Answered   Clinical Intake:  Pre-visit preparation completed: Yes  Pain : No/denies pain     Nutritional Status: BMI of 19-24  Normal Nutritional Risks: None Diabetes: No  How often do you need to have someone help you when you read instructions, pamphlets, or other written materials from your doctor or pharmacy?: 1 - Never What is the last grade level you completed in school?: 1 yr of college  Diabetic?No  Interpreter Needed?: No  Information entered by :: Thomasenia Sales LPN   Activities of Daily Living In your present  state of health, do you have any difficulty performing the following activities: 11/14/2019 10/31/2019  Hearing? N N  Vision? N N  Difficulty concentrating or making decisions? N N  Walking or climbing stairs? N N  Dressing or bathing? N N  Doing errands, shopping? N N  Preparing Food and eating ? N -  Using the Toilet? N -  In the past six months, have you accidently leaked urine? N -  Do you have problems with loss of bowel control? N -  Managing your Medications? N -  Managing your Finances? N -  Housekeeping or managing your Housekeeping? N -  Some recent data might be hidden    Patient Care Team: Sheliah Hatch, MD as PCP - General Willis Modena, MD as Consulting Physician (Gastroenterology) Romie Levee, MD as Consulting Physician (General Surgery) Noel Journey as Physician Assistant (Family Medicine) Celso Amy, PA-C as Physician Assistant (Physician Assistant)  Indicate any recent Medical Services you may have received from other than [facility] providers in the past year (date may be approximate).     Assessment:   This is a routine wellness examination for Campus.  Hearing/Vision screen  Hearing Screening   125Hz  250Hz  500Hz  1000Hz  2000Hz  3000Hz  4000Hz  6000Hz  8000Hz   Right ear:           Left ear:           Comments: No issues  Vision Screening Comments: Wears glasses-last eye exam 04/23/19-[facility]  Dietary issues and exercise activities discussed: Current Exercise Habits: Home exercise routine, Type of exercise: walking, Time (Minutes): 20, Frequency (Times/Week): 7, Weekly Exercise (Minutes/Week): 140, Intensity: Mild, Exercise limited by: None identified  Goals    . Patient Stated     Maintain current level of activity       Depression Screen PHQ 2/9 Scores 11/14/2019 10/31/2019 08/03/2019 04/27/2019 03/02/2019 10/27/2018 07/13/2018  PHQ - 2 Score 1 0 1 2 0 6 1  PHQ- 9 Score - 0 1 2 0 6 1    Fall Risk Fall Risk  11/14/2019 10/31/2019  08/03/2019 04/27/2019 03/02/2019  Falls in the past year? 0 0 0 0 0  Number falls in past yr: 0 0  0 0 0  Injury with Fall? 0 0 0 0 0  Follow up Falls prevention discussed Falls evaluation completed Falls evaluation completed Falls evaluation completed Falls evaluation completed    Any stairs in or around the home? No  Home free of loose throw rugs in walkways, pet beds, electrical cords, etc? Yes  Adequate lighting in your home to reduce risk of falls? Yes   ASSISTIVE DEVICES UTILIZED TO PREVENT FALLS:  Life alert? No  Use of a cane, walker or w/c? No  Grab bars in the bathroom? Yes  Shower chair or bench in shower? No  Elevated toilet seat or a handicapped toilet? No   TIMED UP AND GO:  Was the test performed? No . Phone visit   Cognitive Function:No cognitive impairment noted     6CIT Screen 11/14/2019  What Year? 0 points  What month? 0 points  What time? 0 points  Count back from 20 0 points  Months in reverse 0 points  Repeat phrase 0 points  Total Score 0    Immunizations Immunization History  Administered Date(s) Administered  . Fluad Quad(high Dose 65+) 10/27/2018, 10/11/2019  . Influenza Split 01/08/2011  . Influenza Whole 11/03/2009  . Influenza, High Dose Seasonal PF 11/23/2017  . Influenza,inj,Quad PF,6+ Mos 11/01/2012, 11/25/2013, 12/03/2016  . PFIZER SARS-COV-2 Vaccination 03/20/2019, 04/12/2019  . Pneumococcal Conjugate-13 09/23/2013  . Pneumococcal Polysaccharide-23 09/25/2015  . Td 02/04/2008    TDAP status: Due, Education has been provided regarding the importance of this vaccine. Advised may receive this vaccine at local pharmacy or Health Dept. Aware to provide a copy of the vaccination record if obtained from local pharmacy or Health Dept. Verbalized acceptance and understanding.   Flu Vaccine status: Up to date   Pneumococcal vaccine status: Up to date   Covid-19 vaccine status: Completed vaccines  Qualifies for Shingles Vaccine? Yes     Zostavax completed No   Shingrix Completed?: No.    Education has been provided regarding the importance of this vaccine. Patient has been advised to call insurance company to determine out of pocket expense if they have not yet received this vaccine. Advised may also receive vaccine at local pharmacy or Health Dept. Verbalized acceptance and understanding.  Screening Tests Health Maintenance  Topic Date Due  . TETANUS/TDAP  04/26/2020 (Originally 02/03/2018)  . MAMMOGRAM  05/25/2020  . COLONOSCOPY  06/22/2029  . INFLUENZA VACCINE  Completed  . DEXA SCAN  Completed  . COVID-19 Vaccine  Completed  . Hepatitis C Screening  Completed  . PNA vac Low Risk Adult  Completed    Health Maintenance  There are no preventive care reminders to display for this patient.  Colorectal cancer screening: Completed 06/23/2019. Repeat every 10 years   Mammogram status: Completed 05/26/2019. Repeat every year   Bone Density status: Completed 11/09/2018. Results reflect: Bone density results: OSTEOPOROSIS. Repeat every 2 years.  Lung Cancer Screening: (Low Dose CT Chest recommended if Age 78-80 years, 30 pack-year currently smoking OR have quit w/in 15years.) does not qualify.     Additional Screening:  Hepatitis C Screening:  Completed 09/25/2015  Vision Screening: Recommended annual ophthalmology exams for early detection of glaucoma and other disorders of the eye. Is the patient up to date with their annual eye exam?  Yes  Who is the provider or what is the name of the office in which the patient attends annual eye exams? [facility]   Dental Screening: Recommended annual dental exams for proper oral  hygiene  Community Resource Referral / Chronic Care Management: CRR required this visit?  No   CCM required this visit?  No      Plan:     I have personally reviewed and noted the following in the patient's chart:   . Medical and social history . Use of alcohol, tobacco or illicit drugs   . Current medications and supplements . Functional ability and status . Nutritional status . Physical activity . Advanced directives . List of other physicians . Hospitalizations, surgeries, and ER visits in previous 12 months . Vitals . Screenings to include cognitive, depression, and falls . Referrals and appointments  In addition, I have reviewed and discussed with patient certain preventive protocols, quality metrics, and best practice recommendations. A written personalized care plan for preventive services as well as general preventive health recommendations were provided to patient.    Due to this being a telephonic visit, the after visit summary with patients personalized plan was offered to patient via mail or my-chart. Patient would like to access on my-chart.   Roanna Raider, LPN   [id number]/[id number]  Nurse Health Advisor  Nurse Notes: None

## 2019-11-14 ENCOUNTER — Ambulatory Visit (INDEPENDENT_AMBULATORY_CARE_PROVIDER_SITE_OTHER): Payer: Medicare HMO

## 2019-11-14 VITALS — Ht 61.0 in | Wt 113.0 lb

## 2019-11-14 DIAGNOSIS — Z0000 Encounter for general adult medical examination without abnormal findings: Secondary | ICD-10-CM | POA: Diagnosis not present

## 2019-11-14 NOTE — Patient Instructions (Signed)
Regina Gonzalez , Thank you for taking time to complete your Medicare Wellness Visit. I appreciate your ongoing commitment to your health goals. Please review the following plan we discussed and let me know if I can assist you in the future.   Screening recommendations/referrals: Colonoscopy: Completed 06/23/19. No longer required Mammogram: Completed 05/26/2019- Due 05/25/2020 Bone Density: Completed 11/09/2018- Due 11/08/2020 Recommended yearly ophthalmology/optometry visit for glaucoma screening and checkup Recommended yearly dental visit for hygiene and checkup  Vaccinations: Influenza vaccine: Up to Date Pneumococcal vaccine: Completed vaccines Tdap vaccine: Discuss with pharmacy Shingles vaccine: Discuss with pharmacy Covid-19:Completed vaccines  Advanced directives: Please bring a copy for your chart  Conditions/risks identified: See problem list  Next appointment: Follow up in one year for your annual wellness visit 11/19/2020 @ 12:45pm.   Preventive Care 72 Years and Older, Female Preventive care refers to lifestyle choices and visits with your health care provider that can promote health and wellness. What does preventive care include?  A yearly physical exam. This is also called an annual well check.  Dental exams once or twice a year.  Routine eye exams. Ask your health care provider how often you should have your eyes checked.  Personal lifestyle choices, including:  Daily care of your teeth and gums.  Regular physical activity.  Eating a healthy diet.  Avoiding tobacco and drug use.  Limiting alcohol use.  Practicing safe sex.  Taking low-dose aspirin every day.  Taking vitamin and mineral supplements as recommended by your health care provider. What happens during an annual well check? The services and screenings done by your health care provider during your annual well check will depend on your age, overall health, lifestyle risk factors, and family history  of disease. Counseling  Your health care provider may ask you questions about your:  Alcohol use.  Tobacco use.  Drug use.  Emotional well-being.  Home and relationship well-being.  Sexual activity.  Eating habits.  History of falls.  Memory and ability to understand (cognition).  Work and work Astronomer.  Reproductive health. Screening  You may have the following tests or measurements:  Height, weight, and BMI.  Blood pressure.  Lipid and cholesterol levels. These may be checked every 5 years, or more frequently if you are over 27 years old.  Skin check.  Lung cancer screening. You may have this screening every year starting at age 72 if you have a 30-pack-year history of smoking and currently smoke or have quit within the past 15 years.  Fecal occult blood test (FOBT) of the stool. You may have this test every year starting at age 72.  Flexible sigmoidoscopy or colonoscopy. You may have a sigmoidoscopy every 5 years or a colonoscopy every 10 years starting at age 14.  Hepatitis C blood test.  Hepatitis B blood test.  Sexually transmitted disease (STD) testing.  Diabetes screening. This is done by checking your blood sugar (glucose) after you have not eaten for a while (fasting). You may have this done every 1-3 years.  Bone density scan. This is done to screen for osteoporosis. You may have this done starting at age 72.  Mammogram. This may be done every 1-2 years. Talk to your health care provider about how often you should have regular mammograms. Talk with your health care provider about your test results, treatment options, and if necessary, the need for more tests. Vaccines  Your health care provider may recommend certain vaccines, such as:  Influenza vaccine. This is recommended every  year.  Tetanus, diphtheria, and acellular pertussis (Tdap, Td) vaccine. You may need a Td booster every 10 years.  Zoster vaccine. You may need this after age  72.  Pneumococcal 13-valent conjugate (PCV13) vaccine. One dose is recommended after age 72.  Pneumococcal polysaccharide (PPSV23) vaccine. One dose is recommended after age 72. Talk to your health care provider about which screenings and vaccines you need and how often you need them. This information is not intended to replace advice given to you by your health care provider. Make sure you discuss any questions you have with your health care provider. Document Released: 02/16/2015 Document Revised: 10/10/2015 Document Reviewed: 11/21/2014 Elsevier Interactive Patient Education  2017 [place] Prevention in the Home Falls can cause injuries. They can happen to people of all ages. There are many things you can do to make your home safe and to help prevent falls. What can I do on the outside of my home?  Regularly fix the edges of walkways and driveways and fix any cracks.  Remove anything that might make you trip as you walk through a door, such as a raised step or threshold.  Trim any bushes or trees on the path to your home.  Use bright outdoor lighting.  Clear any walking paths of anything that might make someone trip, such as rocks or tools.  Regularly check to see if handrails are loose or broken. Make sure that both sides of any steps have handrails.  Any raised decks and porches should have guardrails on the edges.  Have any leaves, snow, or ice cleared regularly.  Use sand or salt on walking paths during winter.  Clean up any spills in your garage right away. This includes oil or grease spills. What can I do in the bathroom?  Use night lights.  Install grab bars by the toilet and in the tub and shower. Do not use towel bars as grab bars.  Use non-skid mats or decals in the tub or shower.  If you need to sit down in the shower, use a plastic, non-slip stool.  Keep the floor dry. Clean up any water that spills on the floor as soon as it happens.  Remove  soap buildup in the tub or shower regularly.  Attach bath mats securely with double-sided non-slip rug tape.  Do not have throw rugs and other things on the floor that can make you trip. What can I do in the bedroom?  Use night lights.  Make sure that you have a light by your bed that is easy to reach.  Do not use any sheets or blankets that are too big for your bed. They should not hang down onto the floor.  Have a firm chair that has side arms. You can use this for support while you get dressed.  Do not have throw rugs and other things on the floor that can make you trip. What can I do in the kitchen?  Clean up any spills right away.  Avoid walking on wet floors.  Keep items that you use a lot in easy-to-reach places.  If you need to reach something above you, use a strong step stool that has a grab bar.  Keep electrical cords out of the way.  Do not use floor polish or wax that makes floors slippery. If you must use wax, use non-skid floor wax.  Do not have throw rugs and other things on the floor that can make you trip. What can  I do with my stairs?  Do not leave any items on the stairs.  Make sure that there are handrails on both sides of the stairs and use them. Fix handrails that are broken or loose. Make sure that handrails are as long as the stairways.  Check any carpeting to make sure that it is firmly attached to the stairs. Fix any carpet that is loose or worn.  Avoid having throw rugs at the top or bottom of the stairs. If you do have throw rugs, attach them to the floor with carpet tape.  Make sure that you have a light switch at the top of the stairs and the bottom of the stairs. If you do not have them, ask someone to add them for you. What else can I do to help prevent falls?  Wear shoes that:  Do not have high heels.  Have rubber bottoms.  Are comfortable and fit you well.  Are closed at the toe. Do not wear sandals.  If you use a  stepladder:  Make sure that it is fully opened. Do not climb a closed stepladder.  Make sure that both sides of the stepladder are locked into place.  Ask someone to hold it for you, if possible.  Clearly mark and make sure that you can see:  Any grab bars or handrails.  First and last steps.  Where the edge of each step is.  Use tools that help you move around (mobility aids) if they are needed. These include:  Canes.  Walkers.  Scooters.  Crutches.  Turn on the lights when you go into a dark area. Replace any light bulbs as soon as they burn out.  Set up your furniture so you have a clear path. Avoid moving your furniture around.  If any of your floors are uneven, fix them.  If there are any pets around you, be aware of where they are.  Review your medicines with your doctor. Some medicines can make you feel dizzy. This can increase your chance of falling. Ask your doctor what other things that you can do to help prevent falls. This information is not intended to replace advice given to you by your health care provider. Make sure you discuss any questions you have with your health care provider. Document Released: 11/16/2008 Document Revised: 06/28/2015 Document Reviewed: 02/24/2014 Elsevier Interactive Patient Education  2017 [employer].

## 2019-11-15 ENCOUNTER — Ambulatory Visit: Payer: Medicare HMO | Attending: Internal Medicine

## 2019-11-15 DIAGNOSIS — Z23 Encounter for immunization: Secondary | ICD-10-CM

## 2019-11-15 NOTE — Progress Notes (Signed)
   Covid[facility] Vaccination Clinic  Name:  Curry Dulski    MRN: [id number] DOB: 01-Jul-1945  11/15/2019  Ms. Ressel was observed post Covid-19 immunization for 15 minutes without incident. She was provided with Vaccine Information Sheet and instruction to access the V-Safe system.   Ms. Attaway was instructed to call 911 with any severe reactions post vaccine: Marland Kitchen Difficulty breathing  . Swelling of face and throat  . A fast heartbeat  . A bad rash all over body  . Dizziness and weakness

## 2019-11-25 ENCOUNTER — Other Ambulatory Visit: Payer: Self-pay | Admitting: Family Medicine

## 2019-12-05 ENCOUNTER — Encounter: Payer: Self-pay | Admitting: Internal Medicine

## 2019-12-05 ENCOUNTER — Ambulatory Visit: Payer: Medicare HMO | Admitting: Internal Medicine

## 2019-12-05 ENCOUNTER — Other Ambulatory Visit: Payer: Self-pay

## 2019-12-05 VITALS — BP 112/52 | HR 73 | Ht 62.0 in | Wt 113.8 lb

## 2019-12-05 DIAGNOSIS — R0602 Shortness of breath: Secondary | ICD-10-CM | POA: Diagnosis not present

## 2019-12-05 DIAGNOSIS — R002 Palpitations: Secondary | ICD-10-CM | POA: Diagnosis not present

## 2019-12-05 NOTE — Progress Notes (Signed)
HPI [name] is referred today for evaluation of palpitations. She is a pleasant 74 yo woman with a h/o palpitations, and anxiety who was noted to have occaisional palpitations which have improved over the past few months. Her anxiety is also better. She appears to be less stressed. She has a non-exertional vague sensation in her chest which is not related to activity or position or food intake. No sob. No syncope.  Allergies  Allergen Reactions  . Erythromycin Other (See Comments)    "severe GI upset, also all "mycins" meds   . Flagyl [Metronidazole] Other (See Comments)    "flu-like symptoms"     Current Outpatient Medications  Medication Sig Dispense Refill  . ALPRAZolam (XANAX) 0.5 MG tablet 1-2  tablets    . Ascorbic Acid (VITAMIN C PO) Take by mouth daily.    . Calcium Carb-Cholecalciferol (CALCIUM 600 + D PO) Take 2 tablets by mouth daily.    . cyanocobalamin 500 MCG tablet Take 500 mcg by mouth daily.    Marland Kitchen dicyclomine (BENTYL) 10 MG capsule 1 capsule    . Docusate Calcium (STOOL SOFTENER PO) Take by mouth.    . DULoxetine (CYMBALTA) 60 MG capsule TAKE ONE CAPSULE BY MOUTH DAILY 90 capsule 3  . losartan (COZAAR) 25 MG tablet Take 1 tablet (25 mg total) by mouth daily. 90 tablet 1  . Multiple Vitamins-Minerals (MULTIVITAMIN WITH MINERALS) tablet Take 1 tablet by mouth daily.     . ondansetron (ZOFRAN) 4 MG tablet Take 1 tablet (4 mg total) by mouth every 8 (eight) hours as needed for nausea or vomiting. 20 tablet 0  . pantoprazole (PROTONIX) 40 MG tablet Take 1 tablet (40 mg total) by mouth 2 (two) times daily. 180 tablet 0  . simvastatin (ZOCOR) 20 MG tablet TAKE 1 TABLET BY MOUTH EVERYDAY AT BEDTIME 90 tablet 1   No current facility-administered medications for this visit.     Past Medical History:  Diagnosis Date  . Allergic rhinitis   . Anxiety   . Arthritis    HANDS  . Benign liver cyst    multiple  . Chronic constipation   . Depression   .  Diverticulosis of colon   . Emphysematous COPD (HCC)   . External hemorrhoid   . GERD (gastroesophageal reflux disease)   . History of colon polyps   . History of Helicobacter pylori infection   . History of TIA (transient ischemic attack)    apr 2013-- no residual  . Hyperlipidemia   . Occipital neuralgia of left side 12/08/2014  . Osteoporosis   . Pancreatic cyst    simple benign  . PONV (postoperative nausea and vomiting)   . Prolapsed internal hemorrhoids, grade 3   . Simple renal cyst    right   . Wears glasses   . Wears partial dentures    UPPER    ROS:   All systems reviewed and negative except as noted in the HPI.   Past Surgical History:  Procedure Laterality Date  . APPENDECTOMY  1960  . BREAST EXCISIONAL BIOPSY Left   . BREAST EXCISIONAL BIOPSY Right   . COLONOSCOPY WITH PROPOFOL  last one 10-25-2013   polypectomy  . D & C HYSTEROSCOPY W/ ENDOMETRIAL POLYPECTOMY    . ESOPHAGOGASTRODUODENOSCOPY  last one 03-04-2013  . EXCISION BILATERAL BREAST CYST  1995  . HEMORRHOID SURGERY N/A 04/06/2014   Procedure: Hemmorhoidopexy with anal canal biopsy;  Surgeon: Romie Levee, MD;  Location: [facility]  [place];  Service: General;  Laterality: N/A;  . LAPAROSCOPIC RIGHT OVARIAN CYSTECTOMY/  D & C HYSTEROSCOPY ENDOMETRIAL POLYPECTOMY  May 17, 2009  . TONSILLECTOMY AND ADENOIDECTOMY  1955  . TRANSTHORACIC ECHOCARDIOGRAM  08-13-2012   normal LV/  ef 60-65%/  mild AR/  trivial MR     Family History  Problem Relation Age of Onset  . Alcoholism Father   . Diabetes type II Brother   . Heart attack Brother   . Cirrhosis Mother        non-alcoholic  . Multiple sclerosis Daughter   . Pancreatitis Daughter   . Colon cancer Neg Hx      Social History   Socioeconomic History  . Marital status: Widowed    Spouse name: Channing Mutters  . Number of children: 2  . Years of education: 60  . Highest education level: Not on file  Occupational History  . Occupation: retired  Tobacco  Use  . Smoking status: Former Smoker    Packs/day: 1.00    Years: 17.00    Pack years: 17.00    Types: Cigarettes    Quit date: 04/02/1988    Years since quitting: 31.6  . Smokeless tobacco: Never Used  Vaping Use  . Vaping Use: Never used  Substance and Sexual Activity  . Alcohol use: No  . Drug use: No  . Sexual activity: Not on file  Other Topics Concern  . Not on file  Social History Narrative   Patient lives at home with her grandson.  Husband Channing Mutters) died 05/17/17 as did her daughter Sharyl Nimrod.  Works for [employer]. Patient has high school education and two children (1 deceased). Caffeine 2-3 cups daily.   Right handed    Social Determinants of Health   Financial Resource Strain: Low Risk   . Difficulty of Paying Living Expenses: Not hard at all  Food Insecurity: No Food Insecurity  . Worried About Programme researcher, broadcasting/film/video in the Last Year: Never true  . Ran Out of Food in the Last Year: Never true  Transportation Needs: No Transportation Needs  . Lack of Transportation (Medical): No  . Lack of Transportation (Non-Medical): No  Physical Activity: Insufficiently Active  . Days of Exercise per Week: 7 days  . Minutes of Exercise per Session: 20 min  Stress: No Stress Concern Present  . Feeling of Stress : Not at all  Social Connections: Moderately Integrated  . Frequency of Communication with Friends and Family: More than three times a week  . Frequency of Social Gatherings with Friends and Family: More than three times a week  . Attends Religious Services: More than 4 times per year  . Active Member of Clubs or Organizations: Yes  . Attends Banker Meetings: More than 4 times per year  . Marital Status: Widowed  Intimate Partner Violence: Not At Risk  . Fear of Current or Ex-Partner: No  . Emotionally Abused: No  . Physically Abused: No  . Sexually Abused: No     BP (!) 112/52   Pulse 73   Ht 5\' 2"  (1.575 m)   Wt 113 lb 12.8 oz (51.6 kg)    LMP 06/16/1997   SpO2 97%   BMI 20.81 kg/m   Physical Exam:  Well appearing NAD HEENT: Unremarkable Neck:  No JVD, no thyromegally Lymphatics:  No adenopathy Back:  No CVA tenderness Lungs:  Clear with no wheezes HEART:  Regular rate rhythm, no murmurs, no rubs, no  clicks Abd:  soft, positive bowel sounds, no organomegally, no rebound, no guarding Ext:  2 plus pulses, no edema, no cyanosis, no clubbing Skin:  No rashes no nodules Neuro:  CN II through XII intact, motor grossly intact  DEVICE  Normal device function.  See PaceArt for details.   Assess/Plan: 1. Palpitations - these have improved. She is encouraged to avoid caffeine and ETOH. 2. Non-exertional chest fullness - etiology is unclear. I suspect acid reflux and anxiety is contributing. I discussed the symptoms that she might experience if something more serious were going on.   Sharlot Gowda Taylor,MD

## 2019-12-05 NOTE — Patient Instructions (Signed)

## 2019-12-07 ENCOUNTER — Ambulatory Visit (INDEPENDENT_AMBULATORY_CARE_PROVIDER_SITE_OTHER): Payer: Medicare HMO | Admitting: Physician Assistant

## 2019-12-07 ENCOUNTER — Encounter: Payer: Self-pay | Admitting: Physician Assistant

## 2019-12-07 ENCOUNTER — Other Ambulatory Visit: Payer: Self-pay

## 2019-12-07 VITALS — BP 110/70 | HR 77 | Temp 97.9°F | Resp 14 | Ht 62.0 in | Wt 112.0 lb

## 2019-12-07 DIAGNOSIS — M19041 Primary osteoarthritis, right hand: Secondary | ICD-10-CM

## 2019-12-07 DIAGNOSIS — M19042 Primary osteoarthritis, left hand: Secondary | ICD-10-CM | POA: Diagnosis not present

## 2019-12-07 DIAGNOSIS — M654 Radial styloid tenosynovitis [de Quervain]: Secondary | ICD-10-CM

## 2019-12-07 MED ORDER — DICLOFENAC SODIUM 1 % EX GEL
2.0000 g | Freq: Four times a day (QID) | CUTANEOUS | 1 refills | Status: AC
Start: 2019-12-07 — End: ?

## 2019-12-07 NOTE — Progress Notes (Signed)
Patient presents to clinic today c/o worsening arthritic pains of hands bilaterally with R>L. Notes significant pain of R thumb going into her wrist. Denies trauma or injury. Denies AM stiffness. Denies redness and swelling of joints. Denies numbness or tingling of the area.   Past Medical History:  Diagnosis Date  . Allergic rhinitis   . Anxiety   . Arthritis    HANDS  . Benign liver cyst    multiple  . Chronic constipation   . Depression   . Diverticulosis of colon   . Emphysematous COPD (HCC)   . External hemorrhoid   . GERD (gastroesophageal reflux disease)   . History of colon polyps   . History of Helicobacter pylori infection   . History of TIA (transient ischemic attack)    apr 2013-- no residual  . Hyperlipidemia   . Occipital neuralgia of left side 12/08/2014  . Osteoporosis   . Pancreatic cyst    simple benign  . PONV (postoperative nausea and vomiting)   . Prolapsed internal hemorrhoids, grade 3   . Simple renal cyst    right   . Wears glasses   . Wears partial dentures    UPPER    Current Outpatient Medications on File Prior to Visit  Medication Sig Dispense Refill  . ALPRAZolam (XANAX) 0.5 MG tablet 1-2  tablets    . Ascorbic Acid (VITAMIN C PO) Take by mouth daily.    . Calcium Carb-Cholecalciferol (CALCIUM 600 + D PO) Take 2 tablets by mouth daily.    . cyanocobalamin 500 MCG tablet Take 500 mcg by mouth daily.    Marland Kitchen dicyclomine (BENTYL) 10 MG capsule 1 capsule    . Docusate Calcium (STOOL SOFTENER PO) Take by mouth.    . DULoxetine (CYMBALTA) 60 MG capsule TAKE ONE CAPSULE BY MOUTH DAILY 90 capsule 3  . losartan (COZAAR) 25 MG tablet Take 1 tablet (25 mg total) by mouth daily. 90 tablet 1  . Multiple Vitamins-Minerals (MULTIVITAMIN WITH MINERALS) tablet Take 1 tablet by mouth daily.     . ondansetron (ZOFRAN) 4 MG tablet Take 1 tablet (4 mg total) by mouth every 8 (eight) hours as needed for nausea or vomiting. 20 tablet 0  . pantoprazole (PROTONIX) 40  MG tablet Take 1 tablet (40 mg total) by mouth 2 (two) times daily. 180 tablet 0  . simvastatin (ZOCOR) 20 MG tablet TAKE 1 TABLET BY MOUTH EVERYDAY AT BEDTIME 90 tablet 1   No current facility-administered medications on file prior to visit.    Allergies  Allergen Reactions  . Erythromycin Other (See Comments)    "severe GI upset, also all "mycins" meds   . Flagyl [Metronidazole] Other (See Comments)    "flu-like symptoms"    Family History  Problem Relation Age of Onset  . Alcoholism Father   . Diabetes type II Brother   . Heart attack Brother   . Cirrhosis Mother        non-alcoholic  . Multiple sclerosis Daughter   . Pancreatitis Daughter   . Colon cancer Neg Hx     Social History   Socioeconomic History  . Marital status: Widowed    Spouse name: Channing Mutters  . Number of children: 2  . Years of education: 69  . Highest education level: Not on file  Occupational History  . Occupation: retired  Tobacco Use  . Smoking status: Former Smoker    Packs/day: 1.00    Years: 17.00    Pack years: 17.00  Types: Cigarettes    Quit date: 04/02/1988    Years since quitting: 31.7  . Smokeless tobacco: Never Used  Vaping Use  . Vaping Use: Never used  Substance and Sexual Activity  . Alcohol use: No  . Drug use: No  . Sexual activity: Not on file  Other Topics Concern  . Not on file  Social History Narrative   Patient lives at home with her grandson.  Husband Channing Mutters) died 05/28/17 as did her daughter Sharyl Nimrod.  Works for [employer]. Patient has high school education and two children (1 deceased). Caffeine 2-3 cups daily.   Right handed    Social Determinants of Health   Financial Resource Strain: Low Risk   . Difficulty of Paying Living Expenses: Not hard at all  Food Insecurity: No Food Insecurity  . Worried About Programme researcher, broadcasting/film/video in the Last Year: Never true  . Ran Out of Food in the Last Year: Never true  Transportation Needs: No Transportation Needs  .  Lack of Transportation (Medical): No  . Lack of Transportation (Non-Medical): No  Physical Activity: Insufficiently Active  . Days of Exercise per Week: 7 days  . Minutes of Exercise per Session: 20 min  Stress: No Stress Concern Present  . Feeling of Stress : Not at all  Social Connections: Moderately Integrated  . Frequency of Communication with Friends and Family: More than three times a week  . Frequency of Social Gatherings with Friends and Family: More than three times a week  . Attends Religious Services: More than 4 times per year  . Active Member of Clubs or Organizations: Yes  . Attends Banker Meetings: More than 4 times per year  . Marital Status: Widowed   Review of Systems - See HPI.  All other ROS are negative.  BP 110/70   Pulse 77   Temp 97.9 F (36.6 C) (Temporal)   Resp 14   Ht 5\' 2"  (1.575 m)   Wt 112 lb (50.8 kg)   LMP 06/16/1997   SpO2 99%   BMI 20.49 kg/m   Physical Exam Vitals reviewed.  Constitutional:      Appearance: Normal appearance.  HENT:     Head: Normocephalic and atraumatic.  Cardiovascular:     Rate and Rhythm: Normal rate and regular rhythm.     Pulses: Normal pulses.     Heart sounds: Normal heart sounds.  Musculoskeletal:     Right wrist: Normal.     Left wrist: Normal.     Right hand: Deformity present. No swelling or tenderness. Normal range of motion. Normal strength. Normal sensation. There is no disruption of two-point discrimination. Normal capillary refill. Normal pulse.     Left hand: Deformity present. No swelling or tenderness. Normal range of motion. Normal strength. Normal sensation. There is no disruption of two-point discrimination. Normal capillary refill. Normal pulse.     Cervical back: Neck supple.     Comments: + finklestein test of R hand  Neurological:     General: No focal deficit present.     Mental Status: She is alert and oriented to person, place, and time.  Psychiatric:        Mood and  Affect: Mood normal.    Recent Results (from the past 2158-05-29 hour(s))  Lipid panel     Status: Abnormal   Collection Time: 10/31/19  9:45 AM  Result Value Ref Range   Cholesterol 248 (H) 0 - 200 mg/dL  Comment: ATP III Classification       Desirable:  < 200 mg/dL               Borderline High:  200 - 239 mg/dL          High:  > = 161240 mg/dL   Triglycerides 09.663.0 0 - 149 mg/dL    Comment: Normal:  <045<150 mg/dLBorderline High:  150 - 199 mg/dL   HDL 40.9882.00 >11.91>39.00 mg/dL   VLDL 47.812.6 0.0 - 29.540.0 mg/dL   LDL Cholesterol 621154 (H) 0 - 99 mg/dL   Total CHOL/HDL Ratio 3     Comment:                Men          Women1/2 Average Risk     3.4          3.3Average Risk          5.0          4.42X Average Risk          9.6          7.13X Average Risk          15.0          11.0                       NonHDL 166.29     Comment: NOTE:  Non-HDL goal should be 30 mg/dL higher than patient's LDL goal (i.e. LDL goal of < 70 mg/dL, would have non-HDL goal of < 100 mg/dL)  Basic metabolic panel     Status: Abnormal   Collection Time: 10/31/19  9:45 AM  Result Value Ref Range   Sodium 140 135 - 145 mEq/L   Potassium 4.4 3.5 - 5.1 mEq/L   Chloride 102 96 - 112 mEq/L   CO2 31 19 - 32 mEq/L   Glucose, Bld 83 70 - 99 mg/dL   BUN 23 6 - 23 mg/dL   Creatinine, Ser 3.080.98 0.40 - 1.20 mg/dL   GFR 65.7855.74 (L) >46.96>60.00 mL/min   Calcium 9.6 8.4 - 10.5 mg/dL  TSH     Status: None   Collection Time: 10/31/19  9:45 AM  Result Value Ref Range   TSH 3.01 0.35 - 4.50 uIU/mL  Hepatic function panel     Status: None   Collection Time: 10/31/19  9:45 AM  Result Value Ref Range   Total Bilirubin 0.3 0.2 - 1.2 mg/dL   Bilirubin, Direct 0.1 0.0 - 0.3 mg/dL   Alkaline Phosphatase 50 39 - 117 U/L   AST 17 0 - 37 U/L   ALT 11 0 - 35 U/L   Total Protein 6.3 6.0 - 8.3 g/dL   Albumin 4.3 3.5 - 5.2 g/dL  CBC with Differential/Platelet     Status: Abnormal   Collection Time: 10/31/19  9:45 AM  Result Value Ref Range   WBC 3.9 (L) 4.0 -  10.5 K/uL   RBC 4.04 3.87 - 5.11 Mil/uL   Hemoglobin 12.2 12.0 - 15.0 g/dL   HCT 29.536.7 36 - 46 %   MCV 90.7 78.0 - 100.0 fl   MCHC 33.2 30.0 - 36.0 g/dL   RDW 28.414.9 13.211.5 - 44.015.5 %   Platelets 240.0 150 - 400 K/uL   Neutrophils Relative % 47.1 43 - 77 %   Lymphocytes Relative 38.6 12 - 46 %   Monocytes Relative 8.4 3 - 12 %  Eosinophils Relative 4.7 0 - 5 %   Basophils Relative 1.2 0 - 3 %   Neutro Abs 1.8 1.4 - 7.7 K/uL   Lymphs Abs 1.5 0.7 - 4.0 K/uL   Monocytes Absolute 0.3 0.1 - 1.0 K/uL   Eosinophils Absolute 0.2 0.0 - 0.7 K/uL   Basophils Absolute 0.0 0.0 - 0.1 K/uL    Assessment/Plan: 1. Primary osteoarthritis of both hands Start Voltaren gel to the area. Supportive measures reviewed. OTC medications discussed. If not improving would consider Rx medication. - diclofenac Sodium (VOLTAREN) 1 % GEL; Apply 2 g topically 4 (four) times daily.  Dispense: 100 g; Refill: 1  2. De Quervain's tenosynovitis, right Mild. Will give thumb spica splint for immobilization. voltaren as directed. Follow-up PRN if not improving.  - diclofenac Sodium (VOLTAREN) 1 % GEL; Apply 2 g topically 4 (four) times daily.  Dispense: 100 g; Refill: 1  This visit occurred during the SARS-CoV-2 public health emergency.  Safety protocols were in place, including screening questions prior to the visit, additional usage of staff PPE, and extensive cleaning of exam room while observing appropriate contact time as indicated for disinfecting solutions.     Piedad Climes, PA-C

## 2019-12-07 NOTE — Patient Instructions (Signed)
Please apply the topical diclofenac (Voltaren) as directed.  Wear the thumb spica splint each day, taking off at night while resting to ket skin breathe. Wear for the next 10 -days. If no significant improvement I recommend we start oral medication.   Arthritis Arthritis means joint pain. It can also mean joint disease. A joint is a place where bones come together. There are more than 100 types of arthritis. What are the causes? This condition may be caused by:  Wear and tear of a joint. This is the most common cause.  A lot of acid in the blood, which leads to pain in the joint (gout).  Pain and swelling (inflammation) in a joint.  Infection of a joint.  Injuries in the joint.  A reaction to medicines (allergy). In some cases, the cause may not be known. What are the signs or symptoms? Symptoms of this condition include:  Redness at a joint.  Swelling at a joint.  Stiffness at a joint.  Warmth coming from the joint.  A fever.  A feeling of being sick. How is this treated? This condition may be treated with:  Treating the cause, if it is known.  Rest.  Raising (elevating) the joint.  Putting cold or hot packs on the joint.  Medicines to treat symptoms and reduce pain and swelling.  Shots of medicines (cortisone) into the joint. You may also be told to make changes in your life, such as doing exercises and losing weight. Follow these instructions at home: Medicines  Take over-the-counter and prescription medicines only as told by your doctor.  Do not take aspirin for pain if your doctor says that you may have gout. Activity  Rest your joint if your doctor tells you to.  Avoid activities that make the pain worse.  Exercise your joint regularly as told by your doctor. Try doing exercises like: ? Swimming. ? Water aerobics. ? Biking. ? Walking. Managing pain, stiffness, and swelling      If told, put ice on the affected area. ? Put ice in a  plastic bag. ? Place a towel between your skin and the bag. ? Leave the ice on for 20 minutes, 2-3 times per day.  If your joint is swollen, raise (elevate) it above the level of your heart if told by your doctor.  If your joint feels stiff in the morning, try taking a warm shower.  If told, put heat on the affected area. Do this as often as told by your doctor. Use the heat source that your doctor recommends, such as a moist heat pack or a heating pad. If you have diabetes, do not apply heat without asking your doctor. To apply heat: ? Place a towel between your skin and the heat source. ? Leave the heat on for 20-30 minutes. ? Remove the heat if your skin turns bright red. This is very important if you are unable to feel pain, heat, or cold. You may have a greater risk of getting burned. General instructions  Do not use any products that contain nicotine or tobacco, such as cigarettes, e-cigarettes, and chewing tobacco. If you need help quitting, ask your doctor.  Keep all follow-up visits as told by your doctor. This is important. Contact a doctor if:  The pain gets worse.  You have a fever. Get help right away if:  You have very bad pain in your joint.  You have swelling in your joint.  Your joint is red.  Many joints  become painful and swollen.  You have very bad back pain.  Your leg is very weak.  You cannot control your pee (urine) or poop (stool). Summary  Arthritis means joint pain. It can also mean joint disease. A joint is a place where bones come together.  The most common cause of this condition is wear and tear of a joint.  Symptoms of this condition include redness, swelling, or stiffness of the joint.  This condition is treated with rest, raising the joint, medicines, and putting cold or hot packs on the joint.  Follow your doctor's instructions about medicines, activity, exercises, and other home care treatments. This information is not intended to  replace advice given to you by your health care provider. Make sure you discuss any questions you have with your health care provider. Document Revised: 12/28/2017 Document Reviewed: 12/28/2017 Elsevier Patient Education  2020 Elsevier Inc.   Tommi Rumps Quervain's Tenosynovitis  De Quervain's tenosynovitis is a condition that causes inflammation of the tendon on the thumb side of the wrist. Tendons are cords of tissue that connect bones to muscles. The tendons in the hand pass through a tunnel called a sheath. A slippery layer of tissue (synovium) lets the tendons move smoothly in the sheath. With de Quervain's tenosynovitis, the sheath swells or thickens, causing friction and pain. The condition is also called de Quervain's disease and de Quervain's syndrome. It occurs most often in women who are 81-42 years old. What are the causes? The exact cause of this condition is not known. It may be associated with overuse of the hand and wrist. What increases the risk? You are more likely to develop this condition if you:  Use your hands far more than normal, especially if you repeat certain movements that involve twisting your hand or using a tight grip.  Are pregnant.  Are a middle-aged woman.  Have rheumatoid arthritis.  Have diabetes. What are the signs or symptoms? The main symptom of this condition is pain on the thumb side of the wrist. The pain may get worse when you grasp something or turn your wrist. Other symptoms may include:  Pain that extends up the forearm.  Swelling of your wrist and hand.  Trouble moving the thumb and wrist.  A sensation of snapping in the wrist.  A bump filled with fluid (cyst) in the area of the pain. How is this diagnosed? This condition may be diagnosed based on:  Your symptoms and medical history.  A physical exam. During the exam, your health care provider may do a simple test Lourena Simmonds test) that involves pulling your thumb and wrist to see if  this causes pain. You may also need to have an X-ray. How is this treated? Treatment for this condition may include:  Avoiding any activity that causes pain and swelling.  Taking medicines. Anti-inflammatory medicines and corticosteroid injections may be used to reduce inflammation and relieve pain.  Wearing a splint.  Having surgery. This may be needed if other treatments do not work. Once the pain and swelling has gone down:  Physical therapy. This includes stretching and strengthening exercises.  Occupational therapy. This includes adjusting how you move your wrist. Follow these instructions at home: If you have a splint:  Wear the splint as told by your health care provider. Remove it only as told by your health care provider.  Loosen the splint if your fingers tingle, become numb, or turn cold and blue.  Keep the splint clean.  If the splint  is not waterproof: ? Do not let it get wet. ? Cover it with a watertight covering when you take a bath or a shower. Managing pain, stiffness, and swelling   Avoid movements and activities that cause pain and swelling in the wrist area.  If directed, put ice on the painful area. This may be helpful after doing activities that involve the sore wrist. ? Put ice in a plastic bag. ? Place a towel between your skin and the bag. ? Leave the ice on for 20 minutes, 2-3 times a day.  Move your fingers often to avoid stiffness and to lessen swelling.  Raise (elevate) the injured area above the level of your heart while you are sitting or lying down. General instructions  Return to your normal activities as told by your health care provider. Ask your health care provider what activities are safe for you.  Take over-the-counter and prescription medicines only as told by your health care provider.  Keep all follow-up visits as told by your health care provider. This is important. Contact a health care provider if:  Your pain medicine  does not help.  Your pain gets worse.  You develop new symptoms. Summary  De Quervain's tenosynovitis is a condition that causes inflammation of the tendon on the thumb side of the wrist.  The condition occurs most often in women who are 52-28 years old.  The exact cause of this condition is not known. It may be associated with overuse of the hand and wrist.  Treatment starts with avoiding activity that causes pain or swelling in the wrist area. Other treatment may include wearing a splint and taking medicine. Sometimes, surgery is needed. This information is not intended to replace advice given to you by your health care provider. Make sure you discuss any questions you have with your health care provider. Document Revised: 07/23/2017 Document Reviewed: 12/29/2016 Elsevier Patient Education  2020 [employer].

## 2019-12-09 ENCOUNTER — Other Ambulatory Visit: Payer: Self-pay

## 2019-12-09 ENCOUNTER — Telehealth: Payer: Self-pay | Admitting: Family Medicine

## 2019-12-09 DIAGNOSIS — R768 Other specified abnormal immunological findings in serum: Secondary | ICD-10-CM

## 2019-12-09 NOTE — Telephone Encounter (Signed)
Referral Placed 

## 2019-12-09 NOTE — Telephone Encounter (Signed)
She saw Dr Corliss Skains (Rheum) previously for + ANA.  If she would like a referral to [facility], this would be the diagnosis unless she has a specific concern

## 2019-12-09 NOTE — Telephone Encounter (Signed)
I have faxed the referral to   R[facility] - [place] [facility], clinical Rheumatology [facility] [address]  [place], [place] [address]  Demetrius [phone] [phone]

## 2019-12-09 NOTE — Telephone Encounter (Signed)
Routine to PCP for review. I did see her yesterday for OA of hand and Regina Gonzalez. No mention of rheumatology referral or request at that time.

## 2019-12-09 NOTE — Telephone Encounter (Signed)
Please advise 

## 2019-12-09 NOTE — Telephone Encounter (Signed)
[facility] called and states that patient is wanting a referral to them.  Please send referral to fax number in contact information.  Thanks

## 2019-12-14 ENCOUNTER — Other Ambulatory Visit: Payer: Self-pay | Admitting: Family Medicine

## 2019-12-18 IMAGING — CT CT HEAD W/O CM
3 series · 16 of 47 positions shown, 19 images · non-contrast
Comparison: 07/07/2014

CLINICAL DATA: Right-sided headache and facial pain intermittently
for 2 months which is radiating into the back of the neck today.
Light sensitivity.

EXAM:
CT HEAD WITHOUT CONTRAST
TECHNIQUE: Contiguous axial images were obtained from the base of the skull
through the vertex without intravenous contrast.

[Series 2: head wo · axial · 0.44mm/px · z∈[-163,-33]mm · 10 of 32 slices shown, 13 images]
[im 3/32  brain]
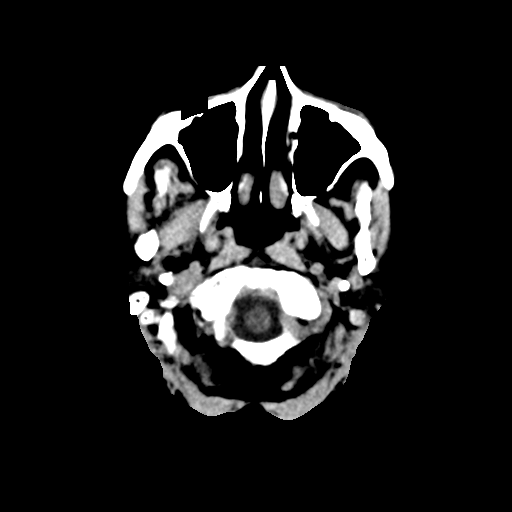
[im 3/32  bone]
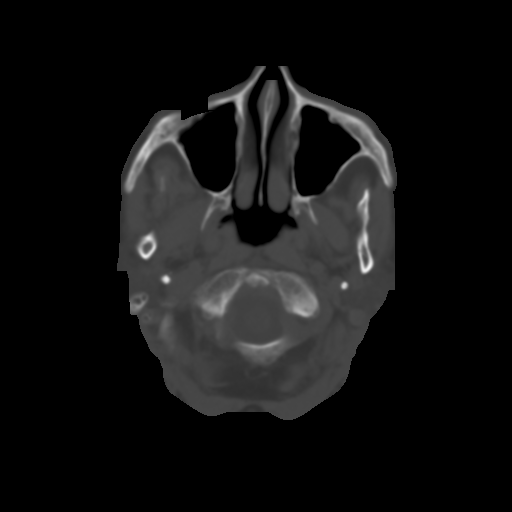
[im 6/32  brain]
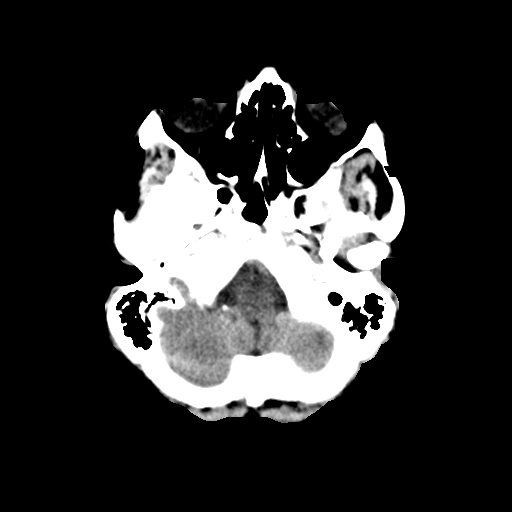
[im 9/32  brain]
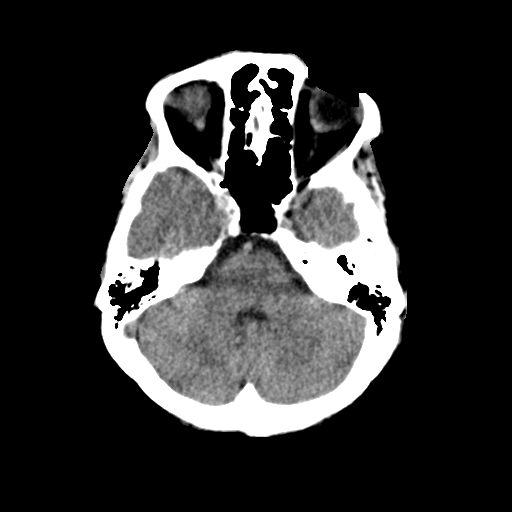
[im 11/32  brain]
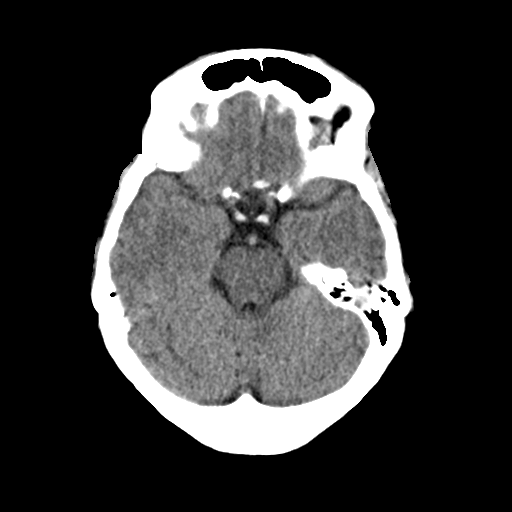
[im 14/32  brain]
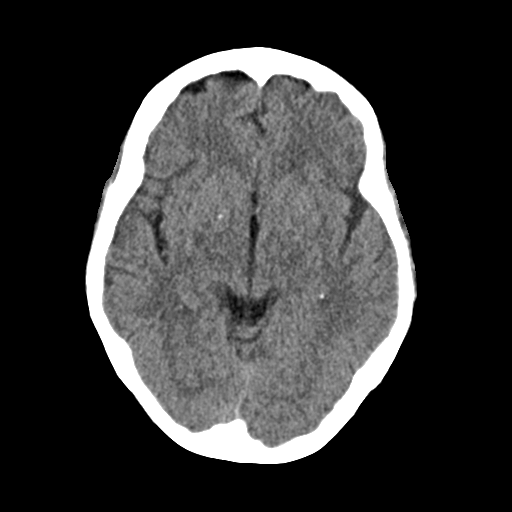
[im 14/32  bone]
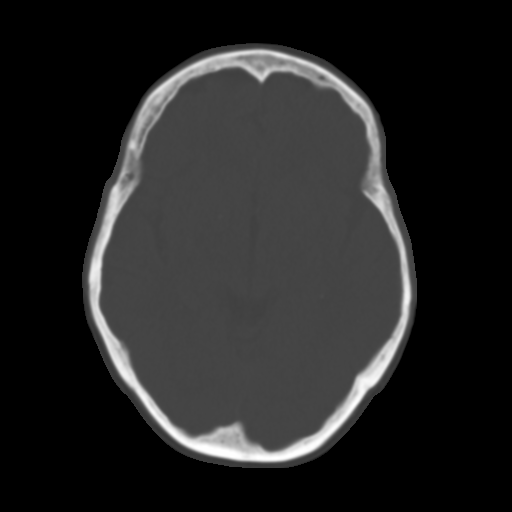
[im 18/32  brain]
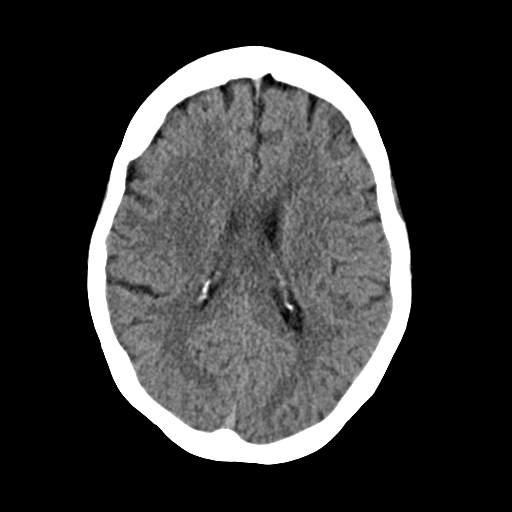
[im 21/32  brain]
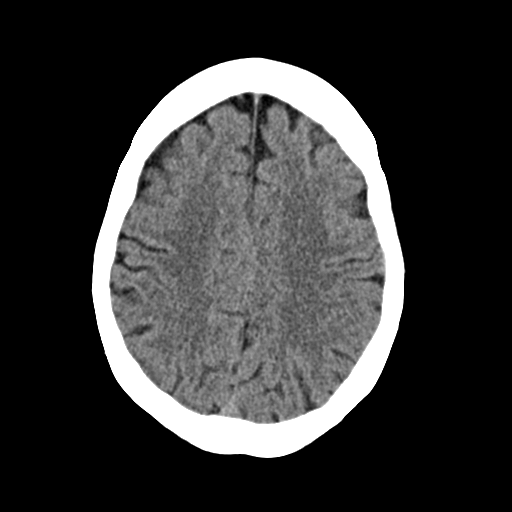
[im 24/32  brain]
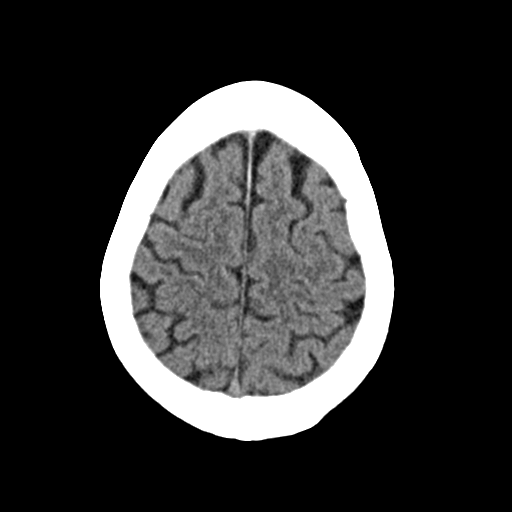
[im 26/32  brain]
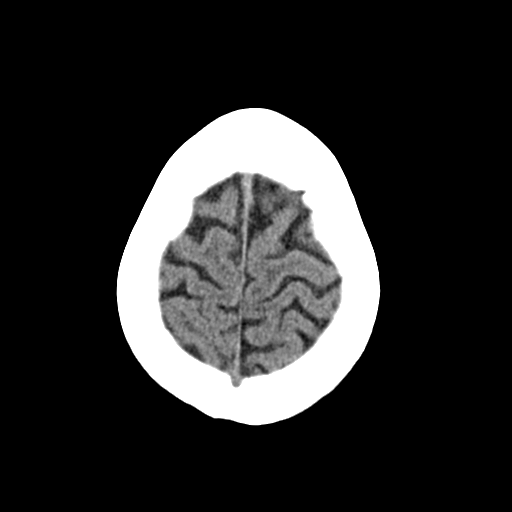
[im 26/32  bone]
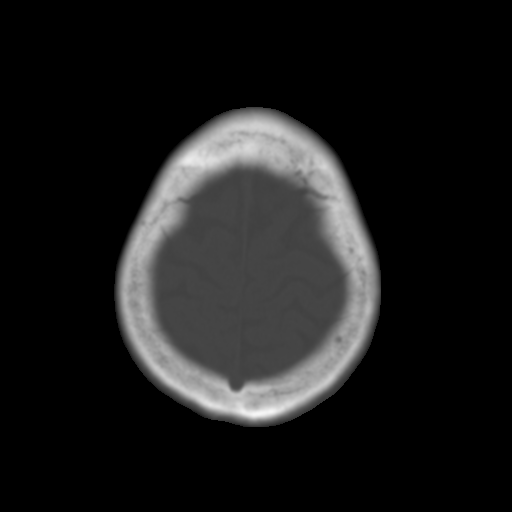
[im 29/32  brain]
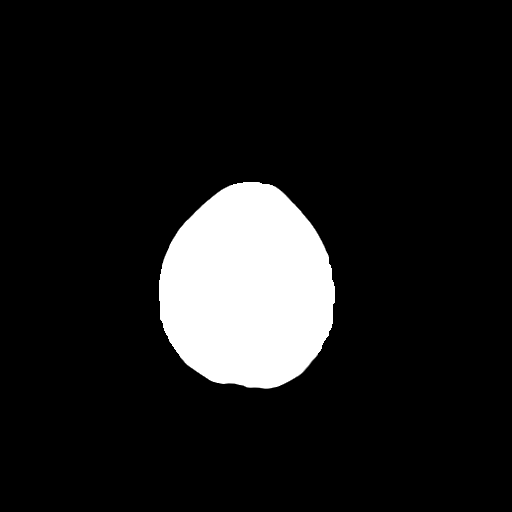

[Series 4: coronal soft · coronal · 0.36mm/px · 3 of 64 slices shown]
[im 22/64  brain]
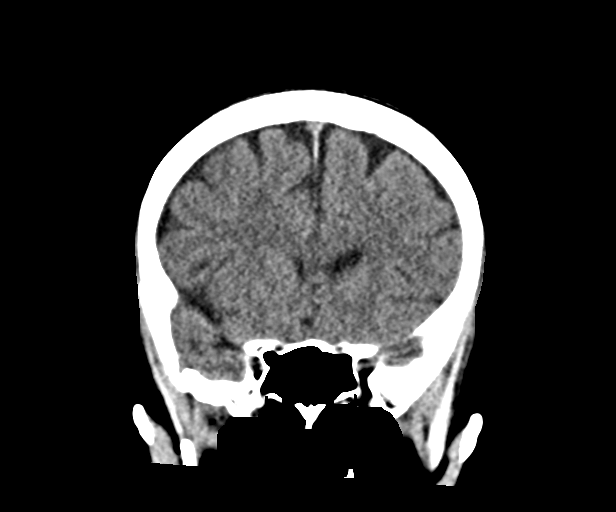
[im 29/64  brain]
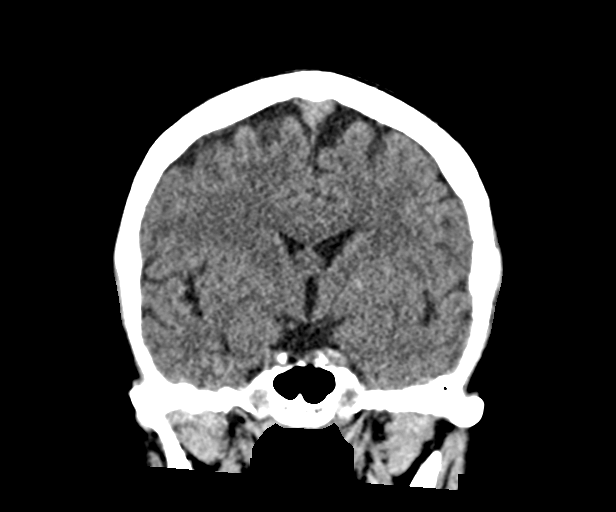
[im 36/64  brain]
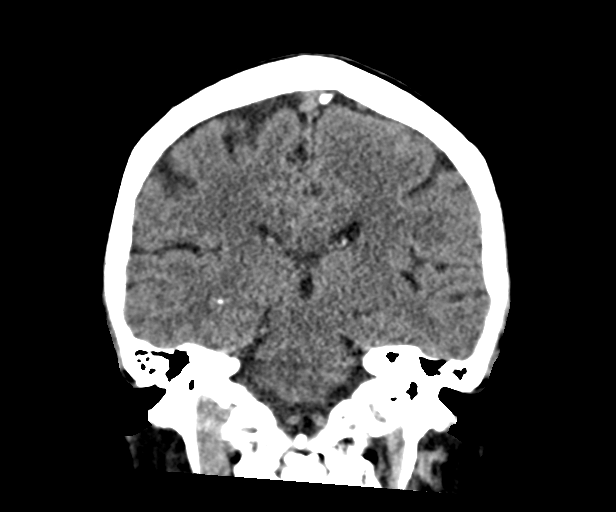

[Series 5: sag soft · sagittal · 0.36mm/px · 3 of 55 slices shown]
[im 19/55  brain]
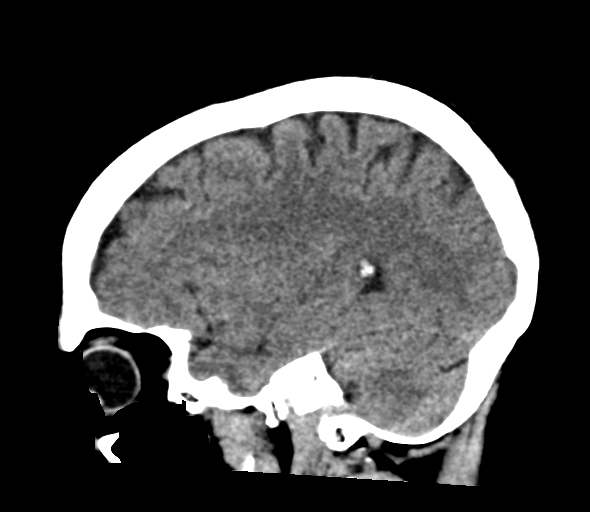
[im 28/55  brain]
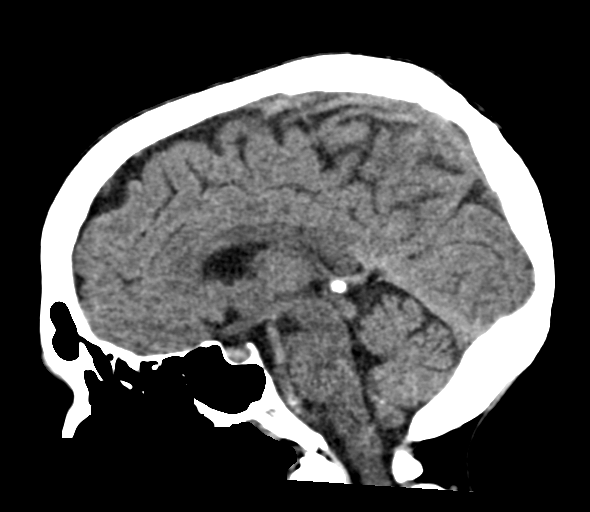
[im 37/55  brain]
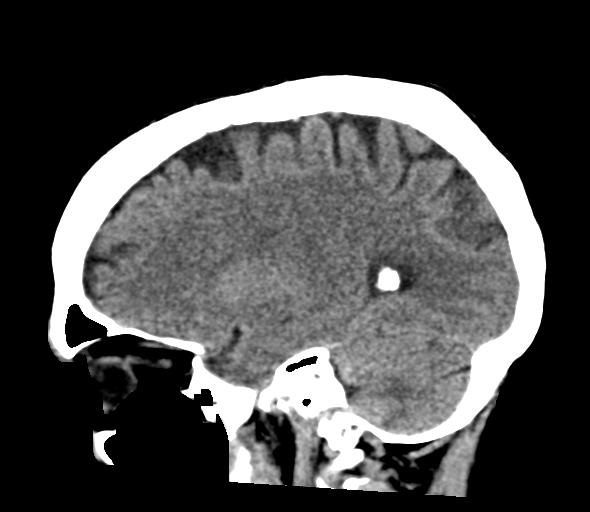

[16 of 47 positions shown; findings below may reference images not displayed]

FINDINGS: Brain: There is no evidence of acute infarct, intracranial
hemorrhage, mass, midline shift, or extra-axial fluid collection.
The ventricles and sulci are normal.

Vascular: No hyperdense vessel.

Skull: No fracture or focal osseous lesion.

Sinuses/Orbits: Visualized paranasal sinuses and mastoid air cells
are clear. Orbits are unremarkable.

Other: None.
IMPRESSION: Negative head CT.

## 2019-12-22 ENCOUNTER — Ambulatory Visit: Payer: Medicare HMO

## 2020-01-09 ENCOUNTER — Telehealth: Payer: Self-pay | Admitting: Family Medicine

## 2020-01-09 MED ORDER — LORATADINE 10 MG PO TABS
10.0000 mg | ORAL_TABLET | Freq: Every day | ORAL | 11 refills | Status: DC
Start: 2020-01-09 — End: 2021-06-11

## 2020-01-09 NOTE — Telephone Encounter (Signed)
[facility] in [facility]

## 2020-01-09 NOTE — Telephone Encounter (Signed)
Prescription sent for Loratidine to her preferred pharmacy

## 2020-01-09 NOTE — Addendum Note (Signed)
Addended by: Sheliah Hatch on: 01/09/2020 06:25 PM   Modules accepted: Orders

## 2020-01-19 ENCOUNTER — Telehealth: Payer: Self-pay | Admitting: Family Medicine

## 2020-01-19 NOTE — Telephone Encounter (Signed)
Encounter made in error - patient decided to go on to Urgent Care

## 2020-02-08 ENCOUNTER — Ambulatory Visit (INDEPENDENT_AMBULATORY_CARE_PROVIDER_SITE_OTHER): Payer: Medicare HMO | Admitting: Family Medicine

## 2020-02-08 ENCOUNTER — Encounter: Payer: Self-pay | Admitting: Family Medicine

## 2020-02-08 ENCOUNTER — Other Ambulatory Visit: Payer: Self-pay

## 2020-02-08 VITALS — BP 140/60 | HR 67 | Temp 98.6°F | Resp 16 | Ht 62.0 in | Wt 114.6 lb

## 2020-02-08 DIAGNOSIS — I10 Essential (primary) hypertension: Secondary | ICD-10-CM

## 2020-02-08 DIAGNOSIS — M1811 Unilateral primary osteoarthritis of first carpometacarpal joint, right hand: Secondary | ICD-10-CM

## 2020-02-08 MED ORDER — LOSARTAN POTASSIUM 50 MG PO TABS: 50.0000 mg | ORAL_TABLET | Freq: Every day | ORAL | 1 refills | Status: DC

## 2020-02-08 NOTE — Assessment & Plan Note (Signed)
Deteriorated.  Home BPs have been running in the 150s.  She is under a lot of stress right now as she is going to sell her house and move for the first time in 30+ yrs.  Rather than let her BP run high, will increase Losartan to 50mg  daily.  Pt expressed understanding and is in agreement w/ plan.

## 2020-02-08 NOTE — Patient Instructions (Signed)
Follow up as needed or as scheduled Increase the Losartan to 50mg  daily- 2 of what you have at home and 1 of the new prescription I would absolutely see Dr or Dr Butler Denmark at [facility] for the hand ICE your thumb Call with any questions or concerns Happy New Year!!!

## 2020-02-08 NOTE — Progress Notes (Signed)
   Subjective:    Patient ID: Regina Gonzalez, female    DOB: 1945-04-17, 75 y.o.   MRN: [id number]  HPI HTN- pt reports that her home BPs have been in the 150s recently.  This is concerning for pt.  She is on Losartan 25mg  daily.  Pt is under a lot of stress.  Is currently selling the home she has lived in for 30+ yrs and looking for an apartment.  No CP, SOB, HAs, visual changes, edema.  Did have a burst blood vessel in her R eye last week.  R CMC pain- saw and was dx'd w/ tenosynovitis.  Saw Ortho, had an injxn and was told that if it wasn't better in 3 weeks she needed to see a Selena Batten.  injxn was very painful and she didn't get much benefit from it.   Review of Systems For ROS see HPI   This visit occurred during the SARS-CoV-2 public health emergency.  Safety protocols were in place, including screening questions prior to the visit, additional usage of staff PPE, and extensive cleaning of exam room while observing appropriate contact time as indicated for disinfecting solutions.       Objective:   Physical Exam Vitals reviewed.  Constitutional:      General: She is not in acute distress.    Appearance: Normal appearance. She is well-developed and well-nourished.  HENT:     Head: Normocephalic and atraumatic.  Eyes:     Extraocular Movements: EOM normal.     Conjunctiva/sclera: Conjunctivae normal.     Pupils: Pupils are equal, round, and reactive to light.  Neck:     Thyroid: No thyromegaly.  Cardiovascular:     Rate and Rhythm: Normal rate and regular rhythm.     Pulses: Intact distal pulses.     Heart sounds: Normal heart sounds. No murmur heard.   Pulmonary:     Effort: Pulmonary effort is normal. No respiratory distress.     Breath sounds: Normal breath sounds.  Abdominal:     General: There is no distension.     Palpations: Abdomen is soft.     Tenderness: There is no abdominal tenderness.  Musculoskeletal:        General: Tenderness (TTP over R  CMC joint) present. No edema.     Cervical back: Normal range of motion and neck supple.     Right lower leg: No edema.     Left lower leg: No edema.  Lymphadenopathy:     Cervical: No cervical adenopathy.  Skin:    General: Skin is warm and dry.  Neurological:     Mental Status: She is alert and oriented to person, place, and time.  Psychiatric:        Mood and Affect: Mood and affect normal.        Behavior: Behavior normal.           Assessment & Plan:  R CMC pain- new to provider, ongoing for pt.  Agree that she should consult w/ either Dr Careers adviser or Dr Butler Denmark to discuss next steps, including possible surgery.  Pt expressed understanding and is in agreement w/ plan.

## 2020-02-16 ENCOUNTER — Other Ambulatory Visit: Payer: Self-pay | Admitting: Family Medicine

## 2020-02-16 NOTE — Telephone Encounter (Signed)
LR: Information not available in her chart. Qty: Information not available in her chart. Last office visit: 02-08-2020 Upcoming appointment: 04-26-2020

## 2020-04-04 DIAGNOSIS — M79641 Pain in right hand: Secondary | ICD-10-CM | POA: Diagnosis not present

## 2020-04-11 DIAGNOSIS — M25641 Stiffness of right hand, not elsewhere classified: Secondary | ICD-10-CM | POA: Diagnosis not present

## 2020-04-13 ENCOUNTER — Other Ambulatory Visit: Payer: Self-pay | Admitting: Family Medicine

## 2020-04-18 DIAGNOSIS — M1811 Unilateral primary osteoarthritis of first carpometacarpal joint, right hand: Secondary | ICD-10-CM | POA: Diagnosis not present

## 2020-04-18 DIAGNOSIS — M25641 Stiffness of right hand, not elsewhere classified: Secondary | ICD-10-CM | POA: Diagnosis not present

## 2020-04-18 DIAGNOSIS — M180 Bilateral primary osteoarthritis of first carpometacarpal joints: Secondary | ICD-10-CM | POA: Diagnosis not present

## 2020-04-18 DIAGNOSIS — Z4789 Encounter for other orthopedic aftercare: Secondary | ICD-10-CM | POA: Diagnosis not present

## 2020-04-18 DIAGNOSIS — M79641 Pain in right hand: Secondary | ICD-10-CM | POA: Diagnosis not present

## 2020-04-24 ENCOUNTER — Other Ambulatory Visit: Payer: Self-pay | Admitting: Family Medicine

## 2020-04-24 DIAGNOSIS — Z0000 Encounter for general adult medical examination without abnormal findings: Secondary | ICD-10-CM

## 2020-04-26 ENCOUNTER — Ambulatory Visit: Payer: Medicare HMO | Admitting: Family Medicine

## 2020-04-30 DIAGNOSIS — M79641 Pain in right hand: Secondary | ICD-10-CM | POA: Diagnosis not present

## 2020-05-07 DIAGNOSIS — M25641 Stiffness of right hand, not elsewhere classified: Secondary | ICD-10-CM | POA: Diagnosis not present

## 2020-05-16 ENCOUNTER — Other Ambulatory Visit: Payer: Self-pay

## 2020-05-16 ENCOUNTER — Encounter: Payer: Self-pay | Admitting: Family Medicine

## 2020-05-16 ENCOUNTER — Ambulatory Visit (INDEPENDENT_AMBULATORY_CARE_PROVIDER_SITE_OTHER): Payer: Medicare HMO | Admitting: Family Medicine

## 2020-05-16 VITALS — BP 120/60 | HR 64 | Temp 98.8°F | Resp 17 | Ht 62.0 in | Wt 114.6 lb

## 2020-05-16 DIAGNOSIS — Z8639 Personal history of other endocrine, nutritional and metabolic disease: Secondary | ICD-10-CM | POA: Diagnosis not present

## 2020-05-16 DIAGNOSIS — Z0000 Encounter for general adult medical examination without abnormal findings: Secondary | ICD-10-CM | POA: Diagnosis not present

## 2020-05-16 DIAGNOSIS — F331 Major depressive disorder, recurrent, moderate: Secondary | ICD-10-CM | POA: Diagnosis not present

## 2020-05-16 DIAGNOSIS — M810 Age-related osteoporosis without current pathological fracture: Secondary | ICD-10-CM | POA: Diagnosis not present

## 2020-05-16 DIAGNOSIS — I10 Essential (primary) hypertension: Secondary | ICD-10-CM | POA: Diagnosis not present

## 2020-05-16 LAB — CBC WITH DIFFERENTIAL/PLATELET
Basophils Absolute: 0 10*3/uL (ref 0.0–0.1)
Basophils Relative: 1.2 % (ref 0.0–3.0)
Eosinophils Absolute: 0.2 10*3/uL (ref 0.0–0.7)
Eosinophils Relative: 6.3 % — ABNORMAL HIGH (ref 0.0–5.0)
HCT: 32.6 % — ABNORMAL LOW (ref 36.0–46.0)
Hemoglobin: 10.7 g/dL — ABNORMAL LOW (ref 12.0–15.0)
Lymphocytes Relative: 44.4 % (ref 12.0–46.0)
Lymphs Abs: 1.5 10*3/uL (ref 0.7–4.0)
MCHC: 32.9 g/dL (ref 30.0–36.0)
MCV: 81.6 fl (ref 78.0–100.0)
Monocytes Absolute: 0.3 10*3/uL (ref 0.1–1.0)
Monocytes Relative: 9.9 % (ref 3.0–12.0)
Neutro Abs: 1.3 10*3/uL — ABNORMAL LOW (ref 1.4–7.7)
Neutrophils Relative %: 38.2 % — ABNORMAL LOW (ref 43.0–77.0)
Platelets: 262 10*3/uL (ref 150.0–400.0)
RBC: 3.99 Mil/uL (ref 3.87–5.11)
RDW: 16.1 % — ABNORMAL HIGH (ref 11.5–15.5)
WBC: 3.4 10*3/uL — ABNORMAL LOW (ref 4.0–10.5)

## 2020-05-16 LAB — LIPID PANEL
Cholesterol: 160 mg/dL (ref 0–200)
HDL: 80.7 mg/dL (ref >39.00)
LDL Cholesterol: 72 mg/dL (ref 0–99)
NonHDL: 79.78
Total CHOL/HDL Ratio: 2
Triglycerides: 37 mg/dL (ref 0.0–149.0)
VLDL: 7.4 mg/dL (ref 0.0–40.0)

## 2020-05-16 LAB — BASIC METABOLIC PANEL WITH GFR
BUN: 14 mg/dL (ref 6–23)
CO2: 31 meq/L (ref 19–32)
Calcium: 9.7 mg/dL (ref 8.4–10.5)
Chloride: 103 meq/L (ref 96–112)
Creatinine, Ser: 0.94 mg/dL (ref 0.40–1.20)
GFR: 60.49 mL/min
Glucose, Bld: 72 mg/dL (ref 70–99)
Potassium: 4.1 meq/L (ref 3.5–5.1)
Sodium: 139 meq/L (ref 135–145)

## 2020-05-16 LAB — HEPATIC FUNCTION PANEL
ALT: 15 U/L (ref 0–35)
AST: 20 U/L (ref 0–37)
Albumin: 3.9 g/dL (ref 3.5–5.2)
Alkaline Phosphatase: 53 U/L (ref 39–117)
Bilirubin, Direct: 0.1 mg/dL (ref 0.0–0.3)
Total Bilirubin: 0.4 mg/dL (ref 0.2–1.2)
Total Protein: 6.5 g/dL (ref 6.0–8.3)

## 2020-05-16 LAB — VITAMIN D 25 HYDROXY (VIT D DEFICIENCY, FRACTURES): VITD: 70.53 ng/mL (ref 30.00–100.00)

## 2020-05-16 LAB — TSH: TSH: 1.34 u[IU]/mL (ref 0.35–4.50)

## 2020-05-16 NOTE — Assessment & Plan Note (Signed)
Pt's PE WNL.  UTD on mammo, colonoscopy, DEXA, immunizations.  Check labs.  Anticipatory guidance provided.  

## 2020-05-16 NOTE — Progress Notes (Signed)
   Subjective:    Patient ID: Regina Gonzalez, female    DOB: 04/27/1945, 75 y.o.   MRN: [id number]  HPI CPE- UTD on mammo, colonoscopy, DEXA, Pneumonia vaccines, Tdap, flu, COVID  Reviewed past medical, surgical, family and social histories.   Health Maintenance  Topic Date Due  . MAMMOGRAM  05/25/2020  . INFLUENZA VACCINE  09/03/2020  . COLONOSCOPY (Pts 45-81yrs Insurance coverage will need to be confirmed)  06/22/2029  . TETANUS/TDAP  12/23/2029  . DEXA SCAN  Completed  . COVID-19 Vaccine  Completed  . Hepatitis C Screening  Completed  . PNA vac Low Risk Adult  Completed  . HPV VACCINES  Aged Out    Patient Care Team    Relationship Specialty Notifications Start End  Sheliah Hatch, MD PCP - General   01/09/10    Comment: Concepcion Living, MD Consulting Physician Gastroenterology  09/15/14   Romie Levee, MD Consulting Physician General Surgery  09/15/14   Waldon Merl, PA-C Physician Assistant Family Medicine  11/04/18   Celso Amy, PA-C Physician Assistant Physician Assistant  05/09/19       Review of Systems Patient reports no vision/ hearing changes, adenopathy,fever, weight change,  persistant/recurrent hoarseness , swallowing issues, chest pain, palpitations, edema, persistant/recurrent cough, hemoptysis, dyspnea (rest/exertional/paroxysmal nocturnal), gastrointestinal bleeding (melena, rectal bleeding), abdominal pain, significant heartburn, bowel changes, GU symptoms (dysuria, hematuria, incontinence), Gyn symptoms (abnormal  bleeding, pain),  syncope, focal weakness, memory loss, numbness & tingling, skin/hair/nail changes, abnormal bruising or bleeding, anxiety, or depression.   This visit occurred during the SARS-CoV-2 public health emergency.  Safety protocols were in place, including screening questions prior to the visit, additional usage of staff PPE, and extensive cleaning of exam room while observing appropriate contact time as indicated  for disinfecting solutions.      Objective:   Physical Exam General Appearance:    Alert, cooperative, no distress, appears stated age  Head:    Normocephalic, without obvious abnormality, atraumatic  Eyes:    PERRL, conjunctiva/corneas clear, EOM's intact, fundi    benign, both eyes  Ears:    Normal TM's and external ear canals, both ears  Nose:   Deferred due to COVID  Throat:   Neck:   Supple, symmetrical, trachea midline, no adenopathy;    Thyroid: no enlargement/tenderness/nodules  Back:     Symmetric, no curvature, ROM normal, no CVA tenderness  Lungs:     Clear to auscultation bilaterally, respirations unlabored  Chest Wall:    No tenderness or deformity   Heart:    Regular rate and rhythm, S1 and S2 normal, no murmur, rub   or gallop  Breast Exam:    Deferred to mammo  Abdomen:     Soft, non-tender, bowel sounds active all four quadrants,    no masses, no organomegaly  Genitalia:    Deferred  Rectal:    Extremities:   Extremities normal, atraumatic, no cyanosis or edema  Pulses:   2+ and symmetric all extremities  Skin:   Skin color, texture, turgor normal, no rashes or lesions  Lymph nodes:   Cervical, supraclavicular, and axillary nodes normal  Neurologic:   CNII-XII intact, normal strength, sensation and reflexes    throughout          Assessment & Plan:

## 2020-05-16 NOTE — Assessment & Plan Note (Signed)
Chronic problem.  Excellent control.  Currently asymptomatic.  Check labs.  No anticipated med changes. 

## 2020-05-16 NOTE — Patient Instructions (Signed)
Follow up in 6 months to recheck BP and cholesterol We'll notify you of your lab results and make any changes if needed Keep up the good work!  You look great!! Call with any questions or concerns Happy Spring!!! 

## 2020-05-16 NOTE — Assessment & Plan Note (Signed)
Chronic problem.  Pt is currently doing well but admits that she does still have 'bouts of depression'.  On Cymbalta daily.

## 2020-05-16 NOTE — Assessment & Plan Note (Signed)
UTD on DEXA.  Check Vit D.  Replete prn. 

## 2020-05-17 DIAGNOSIS — M25641 Stiffness of right hand, not elsewhere classified: Secondary | ICD-10-CM | POA: Diagnosis not present

## 2020-05-17 LAB — IRON,TIBC AND FERRITIN PANEL
%SAT: 6 % (calc) — ABNORMAL LOW (ref 16–45)
Ferritin: 6 ng/mL — ABNORMAL LOW (ref 16–288)
Iron: 28 ug/dL — ABNORMAL LOW (ref 45–160)
TIBC: 457 mcg/dL (calc) — ABNORMAL HIGH (ref 250–450)

## 2020-05-26 DIAGNOSIS — A499 Bacterial infection, unspecified: Secondary | ICD-10-CM | POA: Diagnosis not present

## 2020-05-26 DIAGNOSIS — R82998 Other abnormal findings in urine: Secondary | ICD-10-CM | POA: Diagnosis not present

## 2020-05-26 DIAGNOSIS — N390 Urinary tract infection, site not specified: Secondary | ICD-10-CM | POA: Diagnosis not present

## 2020-05-26 DIAGNOSIS — R300 Dysuria: Secondary | ICD-10-CM | POA: Diagnosis not present

## 2020-06-01 ENCOUNTER — Other Ambulatory Visit: Payer: Self-pay

## 2020-06-01 DIAGNOSIS — M180 Bilateral primary osteoarthritis of first carpometacarpal joints: Secondary | ICD-10-CM | POA: Diagnosis not present

## 2020-06-01 DIAGNOSIS — M1811 Unilateral primary osteoarthritis of first carpometacarpal joint, right hand: Secondary | ICD-10-CM | POA: Diagnosis not present

## 2020-06-01 DIAGNOSIS — E7800 Pure hypercholesterolemia, unspecified: Secondary | ICD-10-CM

## 2020-06-01 DIAGNOSIS — Z4789 Encounter for other orthopedic aftercare: Secondary | ICD-10-CM | POA: Diagnosis not present

## 2020-06-01 MED ORDER — SIMVASTATIN 20 MG PO TABS: 20.0000 mg | ORAL_TABLET | Freq: Every day | ORAL | 1 refills | Status: DC

## 2020-06-04 ENCOUNTER — Ambulatory Visit: Payer: Medicare HMO | Admitting: Family Medicine

## 2020-06-04 ENCOUNTER — Ambulatory Visit (INDEPENDENT_AMBULATORY_CARE_PROVIDER_SITE_OTHER): Payer: Medicare HMO | Admitting: Family Medicine

## 2020-06-04 ENCOUNTER — Other Ambulatory Visit: Payer: Self-pay

## 2020-06-04 ENCOUNTER — Encounter: Payer: Self-pay | Admitting: Family Medicine

## 2020-06-04 VITALS — BP 120/60 | HR 66 | Temp 99.1°F | Resp 19 | Ht 62.0 in | Wt 114.6 lb

## 2020-06-04 DIAGNOSIS — R350 Frequency of micturition: Secondary | ICD-10-CM | POA: Diagnosis not present

## 2020-06-04 DIAGNOSIS — R319 Hematuria, unspecified: Secondary | ICD-10-CM

## 2020-06-04 DIAGNOSIS — M25641 Stiffness of right hand, not elsewhere classified: Secondary | ICD-10-CM | POA: Diagnosis not present

## 2020-06-04 LAB — POCT URINALYSIS DIPSTICK OB
Bilirubin, UA: NEGATIVE
Blood, UA: POSITIVE
Glucose, UA: NEGATIVE
Ketones, UA: NEGATIVE
Leukocytes, UA: NEGATIVE
Nitrite, UA: NEGATIVE
Spec Grav, UA: 1.015 (ref 1.010–1.025)
Urobilinogen, UA: 0.2 E.U./dL
pH, UA: 6.5 (ref 5.0–8.0)

## 2020-06-04 NOTE — Patient Instructions (Signed)
Follow up as needed or as scheduled We'll notify you of the culture results and determine if urology referral is needed Continue to drink plenty of water Call with any questions or concerns Hang in there!  We'll figure this out!

## 2020-06-04 NOTE — Progress Notes (Signed)
   Subjective:    Patient ID: Regina Gonzalez, female    DOB: 1945/05/01, 75 y.o.   MRN: [id number]  HPI UTI- went to [facility] a week ago and was given 10 days of Cipro.  Continues to have intermittent burning.  Frequency is normalizing.  No N/V, fevers, chills.  Has increased water intake.  No recent hematuria but prior to UC visit had blood on toilet tissue, blood in bowl, blood stain on underwear.  Pt reports hx of hematuria   Review of Systems For ROS see HPI   This visit occurred during the SARS-CoV-2 public health emergency.  Safety protocols were in place, including screening questions prior to the visit, additional usage of staff PPE, and extensive cleaning of exam room while observing appropriate contact time as indicated for disinfecting solutions.       Objective:   Physical Exam Vitals reviewed.  Constitutional:      General: She is not in acute distress.    Appearance: Normal appearance. She is well-developed. She is not ill-appearing.  Abdominal:     General: There is no distension.     Palpations: Abdomen is soft.     Tenderness: There is no abdominal tenderness (no suprapubic or CVA tenderness).  Skin:    General: Skin is warm and dry.  Neurological:     General: No focal deficit present.     Mental Status: She is alert and oriented to person, place, and time.  Psychiatric:        Mood and Affect: Mood normal.        Behavior: Behavior normal.        Thought Content: Thought content normal.           Assessment & Plan:  Hematuria- initially was gross hematuria but this resolved w/ abx use from UC.  Unclear whether urine was sent for culture.  Today she reports she still has intermittent dysuria and she has microscopic blood in urine.  Will repeat culture to determine if infxn was treated appropriately and adjust abx if needed.  If no infxn, will send to urology for hematuria workup

## 2020-06-04 NOTE — Addendum Note (Signed)
Addended by: Karen Kays C on: 06/04/2020 10:15 AM   Modules accepted: Orders

## 2020-06-05 LAB — URINE CULTURE
MICRO NUMBER:: 11838315
Result:: NO GROWTH
SPECIMEN QUALITY:: ADEQUATE

## 2020-06-06 ENCOUNTER — Other Ambulatory Visit: Payer: Self-pay

## 2020-06-06 DIAGNOSIS — R3129 Other microscopic hematuria: Secondary | ICD-10-CM

## 2020-06-07 ENCOUNTER — Telehealth: Payer: Self-pay | Admitting: Family Medicine

## 2020-06-07 NOTE — Telephone Encounter (Signed)
Called patient in reference to urology referral. Informed patient they will reach out to her for scheduling.

## 2020-06-07 NOTE — Telephone Encounter (Signed)
Patient would like someone to call her about her urinalysis results

## 2020-06-11 ENCOUNTER — Ambulatory Visit: Payer: Medicare HMO | Admitting: Family Medicine

## 2020-06-13 DIAGNOSIS — R350 Frequency of micturition: Secondary | ICD-10-CM | POA: Diagnosis not present

## 2020-06-13 DIAGNOSIS — R3121 Asymptomatic microscopic hematuria: Secondary | ICD-10-CM | POA: Diagnosis not present

## 2020-06-15 DIAGNOSIS — M180 Bilateral primary osteoarthritis of first carpometacarpal joints: Secondary | ICD-10-CM | POA: Diagnosis not present

## 2020-06-15 DIAGNOSIS — M1811 Unilateral primary osteoarthritis of first carpometacarpal joint, right hand: Secondary | ICD-10-CM | POA: Diagnosis not present

## 2020-06-18 ENCOUNTER — Ambulatory Visit
Admission: RE | Admit: 2020-06-18 | Discharge: 2020-06-18 | Disposition: A | Discharge status: Home / Self Care | Payer: Medicare HMO | Source: Ambulatory Visit | Attending: Family Medicine | Admitting: Family Medicine

## 2020-06-18 ENCOUNTER — Other Ambulatory Visit: Payer: Self-pay

## 2020-06-18 DIAGNOSIS — Z0000 Encounter for general adult medical examination without abnormal findings: Secondary | ICD-10-CM

## 2020-06-18 DIAGNOSIS — Z1231 Encounter for screening mammogram for malignant neoplasm of breast: Secondary | ICD-10-CM | POA: Diagnosis not present

## 2020-06-20 DIAGNOSIS — K862 Cyst of pancreas: Secondary | ICD-10-CM | POA: Diagnosis not present

## 2020-06-20 DIAGNOSIS — N200 Calculus of kidney: Secondary | ICD-10-CM | POA: Diagnosis not present

## 2020-06-20 DIAGNOSIS — K7689 Other specified diseases of liver: Secondary | ICD-10-CM | POA: Diagnosis not present

## 2020-06-20 DIAGNOSIS — M4807 Spinal stenosis, lumbosacral region: Secondary | ICD-10-CM | POA: Diagnosis not present

## 2020-06-20 DIAGNOSIS — R3121 Asymptomatic microscopic hematuria: Secondary | ICD-10-CM | POA: Diagnosis not present

## 2020-06-26 DIAGNOSIS — R3121 Asymptomatic microscopic hematuria: Secondary | ICD-10-CM | POA: Diagnosis not present

## 2020-08-01 ENCOUNTER — Encounter: Payer: Self-pay | Admitting: *Deleted

## 2020-08-02 DIAGNOSIS — Z20822 Contact with and (suspected) exposure to covid-19: Secondary | ICD-10-CM | POA: Diagnosis not present

## 2020-08-06 DIAGNOSIS — Z20822 Contact with and (suspected) exposure to covid-19: Secondary | ICD-10-CM | POA: Diagnosis not present

## 2020-08-09 DIAGNOSIS — I10 Essential (primary) hypertension: Secondary | ICD-10-CM | POA: Diagnosis not present

## 2020-08-09 DIAGNOSIS — U071 COVID-19: Secondary | ICD-10-CM | POA: Diagnosis not present

## 2020-08-09 DIAGNOSIS — Z9189 Other specified personal risk factors, not elsewhere classified: Secondary | ICD-10-CM | POA: Diagnosis not present

## 2020-08-13 ENCOUNTER — Telehealth: Payer: Self-pay | Admitting: *Deleted

## 2020-08-13 NOTE — Chronic Care Management (AMB) (Signed)
  Chronic Care Management   Note  08/13/2020 Name: Shandreka Dante MRN: [id number] DOB: August 06, 1945  Letishia Elliott Jerkins is a 75 y.o. year old female who is a primary care patient of Birdie Riddle, Aundra Millet, MD. I reached out to Julianne Handler by phone today in response to a referral sent by Ms. Bo Mcclintock [place]'s PCP Midge Minium, MD     Ms. Molitor was given information about Chronic Care Management services today including:  CCM service includes personalized support from designated clinical staff supervised by her physician, including individualized plan of care and coordination with other care providers 24/7 contact phone numbers for assistance for urgent and routine care needs. Service will only be billed when office clinical staff spend 20 minutes or more in a month to coordinate care. Only one practitioner may furnish and bill the service in a calendar month. The patient may stop CCM services at any time (effective at the end of the month) by phone call to the office staff. The patient will be responsible for cost sharing (co-pay) of up to 20% of the service fee (after annual deductible is met).  Patient agreed to services and verbal consent obtained.   Follow up plan: Telephone appointment with care management team member scheduled for:08/22/2020  Julian Hy, [place], [place] [facility]  Direct Dial: [phone]

## 2020-08-20 ENCOUNTER — Other Ambulatory Visit: Payer: Self-pay | Admitting: Family Medicine

## 2020-08-20 ENCOUNTER — Encounter: Payer: Self-pay | Admitting: Family Medicine

## 2020-08-20 NOTE — Telephone Encounter (Signed)
Last filled 02/20/20 #60 with 1 refill Last visit 06/04/20 Next visit none

## 2020-08-22 ENCOUNTER — Ambulatory Visit (INDEPENDENT_AMBULATORY_CARE_PROVIDER_SITE_OTHER): Payer: Medicare HMO

## 2020-08-22 DIAGNOSIS — I10 Essential (primary) hypertension: Secondary | ICD-10-CM

## 2020-08-22 DIAGNOSIS — E7800 Pure hypercholesterolemia, unspecified: Secondary | ICD-10-CM

## 2020-08-22 NOTE — Chronic Care Management (AMB) (Signed)
Chronic Care Management   CCM RN Visit Note  08/22/2020 Name: Regina Gonzalez MRN: [id number] DOB: [birth year]  Subjective: Regina Gonzalez is a 75 y.o. year old female who is a primary care patient of Birdie Riddle, Aundra Millet, MD. The care management team was consulted for assistance with disease management and care coordination needs.    Engaged with patient by telephone for initial visit in response to provider referral for case management and/or care coordination services.   Consent to Services:  The patient was given the following information about Chronic Care Management services today, agreed to services, and gave verbal consent: 1. CCM service includes personalized support from designated clinical staff supervised by the primary care provider, including individualized plan of care and coordination with other care providers 2. 24/7 contact phone numbers for assistance for urgent and routine care needs. 3. Service will only be billed when office clinical staff spend 20 minutes or more in a month to coordinate care. 4. Only one practitioner may furnish and bill the service in a calendar month. 5.The patient may stop CCM services at any time (effective at the end of the month) by phone call to the office staff. 6. The patient will be responsible for cost sharing (co-pay) of up to 20% of the service fee (after annual deductible is met). Patient agreed to services and consent obtained.  Patient agreed to services and verbal consent obtained.   Assessment: Review of patient past medical history, allergies, medications, health status, including review of consultants reports, laboratory and other test data, was performed as part of comprehensive evaluation and provision of chronic care management services.   SDOH (Social Determinants of Health) assessments and interventions performed:  SDOH Interventions    Flowsheet Row Most Recent Value  SDOH Interventions   Food Insecurity  Interventions Intervention Not Indicated  Financial Strain Interventions Intervention Not Indicated  Housing Interventions Intervention Not Indicated  Physical Activity Interventions Other (Comments)  [reviewed insurance benefit for gym]  Stress Interventions Patient Refused  [declines LCSW referral]  Transportation Interventions Intervention Not Indicated        CCM Care Plan  Allergies  Allergen Reactions   Codeine Anaphylaxis   Erythromycin Other (See Comments)    "severe GI upset, also all "mycins" meds    Flagyl [Metronidazole] Other (See Comments)    "flu-like symptoms"    Outpatient Encounter Medications as of 08/22/2020  Medication Sig   ALPRAZolam (XANAX) 0.5 MG tablet 1-2  tablets   ALPRAZolam (XANAX) 1 MG tablet TAKE 1/2 TO 1 TABLET BY MOUTH TWO TIMES A DAY TO THREE TIMES A DAY AS NEEDED FOR ANXIETY   Ascorbic Acid (VITAMIN C PO) Take by mouth daily.   Calcium Carb-Cholecalciferol (CALCIUM 600 + D PO) Take 2 tablets by mouth daily.   cyanocobalamin 500 MCG tablet Take 500 mcg by mouth daily.   diclofenac Sodium (VOLTAREN) 1 % GEL Apply 2 g topically 4 (four) times daily.   dicyclomine (BENTYL) 10 MG capsule 1 capsule   Docusate Calcium (STOOL SOFTENER PO) Take by mouth.   DULoxetine (CYMBALTA) 60 MG capsule TAKE ONE CAPSULE BY MOUTH DAILY   loratadine (CLARITIN) 10 MG tablet Take 1 tablet (10 mg total) by mouth daily.   losartan (COZAAR) 50 MG tablet TAKE 1 TABLET BY MOUTH DAILY   Multiple Vitamins-Minerals (MULTIVITAMIN WITH MINERALS) tablet Take 1 tablet by mouth daily.    pantoprazole (PROTONIX) 40 MG tablet TAKE ONE TABLET BY MOUTH TWICE A DAY   simvastatin (ZOCOR)  20 MG tablet Take 1 tablet (20 mg total) by mouth at bedtime.   No facility-administered encounter medications on file as of 08/22/2020.    Patient Active Problem List   Diagnosis Date Noted   Palpitations 12/05/2019   HTN (hypertension) 10/27/2018   Fibromyalgia 01/06/2018   Gout 07/07/2017    Moderate episode of recurrent major depressive disorder ([place]) 06/19/2017   Female bladder prolapse 03/15/2015   Trapezius muscle spasm 12/04/2014   Lateral epicondylitis of right elbow 12/04/2014   History of tobacco abuse 09/15/2014   Memory loss 05/11/2014   Postmenopausal bleeding 07/08/2013   External hemorrhoid, bleeding 04/29/2013   Osteoporosis 07/05/2012   TIA (transient ischemic attack) 07/05/2012   Routine general medical examination at a health care facility 07/05/2012   Hypopigmentation 09/01/2010   GERD 04/15/2010   IRRITABLE BOWEL SYNDROME 04/15/2010   FLATULENCE ERUCTATION AND GAS PAIN 04/15/2010   SHORTNESS OF BREATH 74/25/9563   HELICOBACTER PYLORI GASTRITIS, HX OF 01/10/2010   Hyperlipidemia 12/06/2009   Depression 12/06/2009   ALLERGIC RHINITIS 12/06/2009   BARRETTS ESOPHAGUS 12/06/2009    Conditions to be addressed/monitored:HTN, HLD, and Anxiety  Care Plan : Hyperlipidemia  Updates made by Dimitri Ped, RN since 08/22/2020 12:00 AM     Problem: Lack of Hyperlipidemia self management plan   Priority: Medium     Long-Range Goal: Effective Hyperlipidemia Self-Management   Start Date: 08/22/2020  Expected End Date: 01/03/2021  This Visit's Progress: On track  Priority: Medium  Note:   Current Barriers:  Poorly controlled hyperlipidemia, complicated by HTN, fibromyalgia, osteoarthritis  Current antihyperlipidemic regimen: simvastatin Most recent lipid panel:     Component Value Date/Time   CHOL 160 05/16/2020 1042   TRIG 37.0 05/16/2020 1042   HDL 80.70 05/16/2020 1042   CHOLHDL 2 05/16/2020 1042   VLDL 7.4 05/16/2020 1042   LDLCALC 72 05/16/2020 1042   ASCVD risk enhancing conditions: age >29, HTN Unable to independently self manage hyperlipidemia  States that she tries to eat healthy but she does like to eat sweets such as cakes, pies and candy.  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  Care Manager Clinical Goal(s):  patient will work with Consulting civil engineer, providers, and care team towards execution of optimized self-health management plan patient will verbalize understanding of plan for self management of Hyperlipidemia patient will meet with [place] to address self management of Hyperlipidemia patient will take all medications exactly as prescribed and will call provider for medication related questions patient will attend all scheduled medical appointments: Annual Wellness visit 11/19/20 patient will verbalize basic understanding of self management of Hyperlipidemia disease process and self health management plan patient will demonstrate understanding of rationale for each prescribed medication Interventions: Collaboration with Midge Minium, MD regarding development and update of comprehensive plan of care as evidenced by provider attestation and co-signature Inter-disciplinary care team collaboration (see longitudinal plan of care) Medication review performed; medication list updated in electronic medical record.  Inter-disciplinary care team collaboration (see longitudinal plan of care) Referred to pharmacy team for assistance with HLD medication management Evaluation of current treatment plan related to self management of Hyperlipidemia and patient's adherence to plan as established by provider. Provided education to patient re: self management of Hyperlipidemia Provided patient with written educational materials related to self management of Hyperlipidemia Reviewed scheduled/upcoming provider appointments including:  Discussed plans with patient for ongoing care management follow up and provided patient  with direct contact information for care management team Reviewed how sweets can effect her cholesterol and triglycerides and to try to limit  Reviewed importance of regular exercise and reviewed fitness benefit with her insurance  Patient Goals/Self-Care  Activities: - call for medicine refill 2 or 3 days before it runs out - keep a list of all the medicines I take; vitamins and herbals too - learn to read medicine labels - change to whole grain breads, cereal, pasta - drink 6 to 8 glasses of water each day - eat 3 to 5 servings of fruits and vegetables each day - fill half the plate with nonstarchy vegetables - limit fast food meals to no more than 1 per week - read food labels for fat, fiber, carbohydrates and portion size - switch to low-fat or skim milk - if I have chest pain, call for help - learn about small changes that will make a big difference - change to whole grain breads, cereal, pasta - eat smaller or less servings of red meat - get blood test (fasting) done 1 week before next visit - increase the amount of fiber in food Follow Up Plan: Telephone follow up appointment with care management team member scheduled for: 10/17/20 at 11:30 AM The patient has been provided with contact information for the care management team and has been advised to call with any health related questions or concerns.        Care Plan : Hypertension (Adult)  Updates made by Dimitri Ped, RN since 08/22/2020 12:00 AM     Problem: Lack of Hypertension self managment plan   Priority: High     Long-Range Goal: Effective self managment of Hypertension   Start Date: 08/22/2020  Expected End Date: 01/03/2021  This Visit's Progress: On track  Priority: High  Note:   Current Barriers:  Knowledge Deficits related to basic understanding of hypertension pathophysiology and self care management Unable to independently self manage Hypertension States that she checks her B/P at home usually once a week with readings ranging 119/6-140/70.  States her readings have been good since her B/P medication was increased.  States she tries to follow a low sodium diet.  STates she has some stress raising her 31 yr old grandson but states she is handling OK at this  time. Nurse Case Manager Clinical Goal(s):  patient will verbalize understanding of plan for hypertension management patient will not experience hospital admission. Hospital Admissions in last 6 months = 0 patient will attend all scheduled medical appointments: Annual Wellness Visit 11/19/20 patient will demonstrate improved adherence to prescribed treatment plan for hypertension as evidenced by taking all medications as prescribed, monitoring and recording blood pressure as directed, adhering to low sodium/DASH diet patient will demonstrate improved health management independence as evidenced by checking blood pressure as directed and notifying PCP if SBP>160 or DBP > 90, taking all medications as prescribe, and adhering to a low sodium diet as discussed. patient will verbalize basic understanding of hypertension disease process and self health management plan as evidenced by B/P readings within limits, adherence to medications and self monitoring Interventions:  Collaboration with Midge Minium, MD regarding development and update of comprehensive plan of care as evidenced by provider attestation and co-signature Inter-disciplinary care team collaboration (see longitudinal plan of care) Evaluation of current treatment plan related to hypertension self management and patient's adherence to plan as established by provider. Provided education to patient re: stroke prevention, s/s of heart attack and stroke, DASH diet,  complications of uncontrolled blood pressure Reviewed medications with patient and discussed importance of compliance Discussed plans with patient for ongoing care management follow up and provided patient with direct contact information for care management team Advised patient, providing education and rationale, to monitor blood pressure weekly or more frequently as needed and record, calling PCP for findings outside established parameters.  Reviewed scheduled/upcoming provider  appointments including: Annual Wellness Visit 11/19/20 Reviewed importance of stress management for B/P control and discussed talking to LCSW to help with stress management but declines at this time. Self-Care Activities:  Patient verbalizes understanding of plan to self manage hypertension Self administers medications as prescribed Attends all scheduled provider appointments Calls pharmacy for medication refills Calls provider office for new concerns or questions Patient Goals  - Self administer medications as prescribed  - Attend all scheduled provider appointments  - Call provider office for new concerns, questions, or BP outside discussed parameters  - Check BP and record as discussed  - Follows a low sodium diet/DASH diet - check blood pressure weekly - choose a place to take my blood pressure (home, clinic or office, retail store) - write blood pressure results in a log or diary - ask questions to understand Follow Up Plan: Telephone follow up appointment with care management team member scheduled for: 10/17/20 at 11:30 AM The patient has been provided with contact information for the care management team and has been advised to call with any health related questions or concerns.       Plan:Telephone follow up appointment with care management team member scheduled for:  10/17/20 and The patient has been provided with contact information for the care management team and has been advised to call with any health related questions or concerns.  Peter Garter RN, BSN,CCM, CDE Care Management Coordinator [place] [phone], Mobile [phone]

## 2020-08-22 NOTE — Patient Instructions (Addendum)
Visit Information   PATIENT GOALS:   Goals Addressed             This Visit's Progress    RNCM:Manage My Cholesterol   On track    Timeframe:  Long-Range Goal Priority:  Medium Start Date:      08/22/20                       Expected End Date:   01/03/21                    Follow Up Date 10/17/20    - change to whole grain breads, cereal, pasta - eat smaller or less servings of red meat - fill half the plate with nonstarchy vegetables - get blood test (fasting) done 1 week before next visit - increase the amount of fiber in food - read food labels for fat and fiber - switch to low-fat or skim milk    Why is this important?   Changing cholesterol starts with eating heart-healthy foods.  Other steps may be to increase your activity and to quit if you smoke.    Notes:      RNCM:Track and Manage My Blood Pressure-Hypertension   On track    Timeframe:  Long-Range Goal Priority:  High Start Date:      08/22/20                       Expected End Date:   01/03/21                    Follow Up Date 10/17/20    - check blood pressure weekly - choose a place to take my blood pressure (home, clinic or office, retail store) - write blood pressure results in a log or diary    Why is this important?   You won't feel high blood pressure, but it can still hurt your blood vessels.  High blood pressure can cause heart or kidney problems. It can also cause a stroke.  Making lifestyle changes like losing a little weight or eating less salt will help.  Checking your blood pressure at home and at different times of the day can help to control blood pressure.  If the doctor prescribes medicine remember to take it the way the doctor ordered.  Call the office if you cannot afford the medicine or if there are questions about it.     Notes:       High Cholesterol  High cholesterol is a condition in which the blood has high levels of a white, waxy substance similar to fat (cholesterol). The  liver makes all the cholesterol that the body needs. The human body needs small amounts of cholesterol to help build cells. A person gets extra orexcess cholesterol from the food that he or she eats. The blood carries cholesterol from the liver to the rest of the body. If you have high cholesterol, deposits (plaques) may build up on the walls of your arteries. Arteries are the blood vessels that carry blood away from your heart. These plaques make the arteries narrowand stiff. Cholesterol plaques increase your risk for heart attack and stroke. Work withyour health care provider to keep your cholesterol levels in a healthy range. What increases the risk? The following factors may make you more likely to develop this condition: Eating foods that are high in animal fat (saturated fat) or cholesterol. Being overweight. Not getting  enough exercise. A family history of high cholesterol (familial hypercholesterolemia). Use of tobacco products. Having diabetes. What are the signs or symptoms? There are no symptoms of this condition. How is this diagnosed? This condition may be diagnosed based on the results of a blood test. If you are older than 73 years of age, your health care provider may check your cholesterol levels every 4-6 years. You may be checked more often if you have high cholesterol or other risk factors for heart disease. The blood test for cholesterol measures: "Bad" cholesterol, or LDL cholesterol. This is the main type of cholesterol that causes heart disease. The desired level is less than 100 mg/dL. "Good" cholesterol, or HDL cholesterol. HDL helps protect against heart disease by cleaning the arteries and carrying the LDL to the liver for processing. The desired level for HDL is 60 mg/dL or higher. Triglycerides. These are fats that your body can store or burn for energy. The desired level is less than 150 mg/dL. Total cholesterol. This measures the total amount of cholesterol in  your blood and includes LDL, HDL, and triglycerides. The desired level is less than 200 mg/dL. How is this treated? This condition may be treated with: Diet changes. You may be asked to eat foods that have more fiber and less saturated fats or added sugar. Lifestyle changes. These may include regular exercise, maintaining a healthy weight, and quitting use of tobacco products. Medicines. These are given when diet and lifestyle changes have not worked. You may be prescribed a statin medicine to help lower your cholesterol levels. Follow these instructions at home: Eating and drinking  Eat a healthy, balanced diet. This diet includes: Daily servings of a variety of fresh, frozen, or canned fruits and vegetables. Daily servings of whole grain foods that are rich in fiber. Foods that are low in saturated fats and trans fats. These include poultry and fish without skin, lean cuts of meat, and low-fat dairy products. A variety of fish, especially oily fish that contain omega-3 fatty acids. Aim to eat fish at least 2 times a week. Avoid foods and drinks that have added sugar. Use healthy cooking methods, such as roasting, grilling, broiling, baking, poaching, steaming, and stir-frying. Do not fry your food except for stir-frying.  Lifestyle  Get regular exercise. Aim to exercise for a total of 150 minutes a week. Increase your activity level by doing activities such as gardening, walking, and taking the stairs. Do not use any products that contain nicotine or tobacco, such as cigarettes, e-cigarettes, and chewing tobacco. If you need help quitting, ask your health care provider.  General instructions Take over-the-counter and prescription medicines only as told by your health care provider. Keep all follow-up visits as told by your health care provider. This is important. Where to find more information American Heart Association: www.heart.org National Heart, Lung, and Blood Institute:  https://wilson-eaton.com/ Contact a health care provider if: You have trouble achieving or maintaining a healthy diet or weight. You are starting an exercise program. You are unable to stop smoking. Get help right away if: You have chest pain. You have trouble breathing. You have any symptoms of a stroke. "BE FAST" is an easy way to remember the main warning signs of a stroke: B - Balance. Signs are dizziness, sudden trouble walking, or loss of balance. E - Eyes. Signs are trouble seeing or a sudden change in vision. F - Face. Signs are sudden weakness or numbness of the face, or the face or eyelid  drooping on one side. A - Arms. Signs are weakness or numbness in an arm. This happens suddenly and usually on one side of the body. S - Speech. Signs are sudden trouble speaking, slurred speech, or trouble understanding what people say. T - Time. Time to call emergency services. Write down what time symptoms started. You have other signs of a stroke, such as: A sudden, severe headache with no known cause. Nausea or vomiting. Seizure. These symptoms may represent a serious problem that is an emergency. Do not wait to see if the symptoms will go away. Get medical help right away. Call your local emergency services (911 in the [place] Do not drive yourself to the hospital. Summary Cholesterol plaques increase your risk for heart attack and stroke. Work with your health care provider to keep your cholesterol levels in a healthy range. Eat a healthy, balanced diet, get regular exercise, and maintain a healthy weight. Do not use any products that contain nicotine or tobacco, such as cigarettes, e-cigarettes, and chewing tobacco. Get help right away if you have any symptoms of a stroke. This information is not intended to replace advice given to you by your health care provider. Make sure you discuss any questions you have with your healthcare provider. Document Revised: 12/20/2018 Document Reviewed:  12/20/2018 Elsevier Patient Education  2022 [employer]. Hypertension, Adult Hypertension is another name for high blood pressure. High blood pressure forces your heart to work harder to pump blood. This can cause problems overtime. There are two numbers in a blood pressure reading. There is a top number (systolic) over a bottom number (diastolic). It is best to have a blood pressure that is below 120/80. Healthy choicescan help lower your blood pressure, or you may need medicine to help lower it. What are the causes? The cause of this condition is not known. Some conditions may be related tohigh blood pressure. What increases the risk? Smoking. Having type 2 diabetes mellitus, high cholesterol, or both. Not getting enough exercise or physical activity. Being overweight. Having too much fat, sugar, calories, or salt (sodium) in your diet. Drinking too much alcohol. Having long-term (chronic) kidney disease. Having a family history of high blood pressure. Age. Risk increases with age. Race. You may be at higher risk if you are African American. Gender. Men are at higher risk than women before age 30. After age 22, women are at higher risk than men. Having obstructive sleep apnea. Stress. What are the signs or symptoms? High blood pressure may not cause symptoms. Very high blood pressure (hypertensive crisis) may cause: Headache. Feelings of worry or nervousness (anxiety). Shortness of breath. Nosebleed. A feeling of being sick to your stomach (nausea). Throwing up (vomiting). Changes in how you see. Very bad chest pain. Seizures. How is this treated? This condition is treated by making healthy lifestyle changes, such as: Eating healthy foods. Exercising more. Drinking less alcohol. Your health care provider may prescribe medicine if lifestyle changes are not enough to get your blood pressure under control, and if: Your top number is above 130. Your bottom number is above  80. Your personal target blood pressure may vary. Follow these instructions at home: Eating and drinking  If told, follow the DASH eating plan. To follow this plan: Fill one half of your plate at each meal with fruits and vegetables. Fill one fourth of your plate at each meal with whole grains. Whole grains include whole-wheat pasta, brown rice, and whole-grain bread. Eat or drink low-fat dairy products,  such as skim milk or low-fat yogurt. Fill one fourth of your plate at each meal with low-fat (lean) proteins. Low-fat proteins include fish, chicken without skin, eggs, beans, and tofu. Avoid fatty meat, cured and processed meat, or chicken with skin. Avoid pre-made or processed food. Eat less than 1,500 mg of salt each day. Do not drink alcohol if: Your doctor tells you not to drink. You are pregnant, may be pregnant, or are planning to become pregnant. If you drink alcohol: Limit how much you use to: 0-1 drink a day for women. 0-2 drinks a day for men. Be aware of how much alcohol is in your drink. In the [place] one drink equals one 12 oz bottle of beer (355 mL), one 5 oz glass of wine (148 mL), or one 1 oz glass of hard liquor (44 mL).  Lifestyle  Work with your doctor to stay at a healthy weight or to lose weight. Ask your doctor what the best weight is for you. Get at least 30 minutes of exercise most days of the week. This may include walking, swimming, or biking. Get at least 30 minutes of exercise that strengthens your muscles (resistance exercise) at least 3 days a week. This may include lifting weights or doing Pilates. Do not use any products that contain nicotine or tobacco, such as cigarettes, e-cigarettes, and chewing tobacco. If you need help quitting, ask your doctor. Check your blood pressure at home as told by your doctor. Keep all follow-up visits as told by your doctor. This is important.  Medicines Take over-the-counter and prescription medicines only as told by  your doctor. Follow directions carefully. Do not skip doses of blood pressure medicine. The medicine does not work as well if you skip doses. Skipping doses also puts you at risk for problems. Ask your doctor about side effects or reactions to medicines that you should watch for. Contact a doctor if you: Think you are having a reaction to the medicine you are taking. Have headaches that keep coming back (recurring). Feel dizzy. Have swelling in your ankles. Have trouble with your vision. Get help right away if you: Get a very bad headache. Start to feel mixed up (confused). Feel weak or numb. Feel faint. Have very bad pain in your: Chest. Belly (abdomen). Throw up more than once. Have trouble breathing. Summary Hypertension is another name for high blood pressure. High blood pressure forces your heart to work harder to pump blood. For most people, a normal blood pressure is less than 120/80. Making healthy choices can help lower blood pressure. If your blood pressure does not get lower with healthy choices, you may need to take medicine. This information is not intended to replace advice given to you by your health care provider. Make sure you discuss any questions you have with your healthcare provider. Document Revised: 09/30/2017 Document Reviewed: 09/30/2017 Elsevier Patient Education  2022 [employer].   Consent to CCM Services: Ms. Vazguez was given information about Chronic Care Management services today including:  CCM service includes personalized support from designated clinical staff supervised by her physician, including individualized plan of care and coordination with other care providers 24/7 contact phone numbers for assistance for urgent and routine care needs. Service will only be billed when office clinical staff spend 20 minutes or more in a month to coordinate care. Only one practitioner may furnish and bill the service in a calendar month. The patient may  stop CCM services at any time (effective at the  end of the month) by phone call to the office staff. The patient will be responsible for cost sharing (co-pay) of up to 20% of the service fee (after annual deductible is met).  Patient agreed to services and verbal consent obtained.   Patient verbalizes understanding of instructions provided today and agrees to view in [place].   Telephone follow up appointment with care management team member scheduled for: 10/17/20 at 11:30 AM  Peter Garter RN, Jackquline Denmark, CDE Care Management Coordinator [facility] [phone], Mobile [phone]   CLINICAL CARE PLAN: Patient Care Plan: Hyperlipidemia     Problem Identified: Lack of Hyperlipidemia self management plan   Priority: Medium     Long-Range Goal: Effective Hyperlipidemia Self-Management   Start Date: 08/22/2020  Expected End Date: 01/03/2021  This Visit's Progress: On track  Priority: Medium  Note:   Current Barriers:  Poorly controlled hyperlipidemia, complicated by HTN, fibromyalgia, osteoarthritis  Current antihyperlipidemic regimen: simvastatin Most recent lipid panel:     Component Value Date/Time   CHOL 160 05/16/2020 1042   TRIG 37.0 05/16/2020 1042   HDL 80.70 05/16/2020 1042   CHOLHDL 2 05/16/2020 1042   VLDL 7.4 05/16/2020 1042   LDLCALC 72 05/16/2020 1042   ASCVD risk enhancing conditions: age >26, HTN Unable to independently self manage hyperlipidemia  States that she tries to eat healthy but she does like to eat sweets such as cakes, pies and candy.  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 Care Manager Clinical Goal(s):  patient will work with Consulting civil engineer, providers, and care team towards execution of optimized self-health management plan patient will verbalize understanding of plan for self management of Hyperlipidemia patient will meet with [place] to address self  management of Hyperlipidemia patient will take all medications exactly as prescribed and will call provider for medication related questions patient will attend all scheduled medical appointments: Annual Wellness visit 11/19/20 patient will verbalize basic understanding of self management of Hyperlipidemia disease process and self health management plan patient will demonstrate understanding of rationale for each prescribed medication Interventions: Collaboration with Midge Minium, MD regarding development and update of comprehensive plan of care as evidenced by provider attestation and co-signature Inter-disciplinary care team collaboration (see longitudinal plan of care) Medication review performed; medication list updated in electronic medical record.  Inter-disciplinary care team collaboration (see longitudinal plan of care) Referred to pharmacy team for assistance with HLD medication management Evaluation of current treatment plan related to self management of Hyperlipidemia and patient's adherence to plan as established by provider. Provided education to patient re: self management of Hyperlipidemia Provided patient with written educational materials related to self management of Hyperlipidemia Reviewed scheduled/upcoming provider appointments including:  Discussed plans with patient for ongoing care management follow up and provided patient with direct contact information for care management team Reviewed how sweets can effect her cholesterol and triglycerides and to try to limit  Reviewed importance of regular exercise and reviewed fitness benefit with her insurance  Patient Goals/Self-Care Activities: - call for medicine refill 2 or 3 days before it runs out - keep a list of all the medicines I take; vitamins and herbals too - learn to read medicine labels - change to whole grain breads, cereal, pasta - drink 6 to 8 glasses of water each day - eat 3 to 5 servings of fruits and  vegetables each day - fill half the plate with nonstarchy vegetables -  limit fast food meals to no more than 1 per week - read food labels for fat, fiber, carbohydrates and portion size - switch to low-fat or skim milk - if I have chest pain, call for help - learn about small changes that will make a big difference - change to whole grain breads, cereal, pasta - eat smaller or less servings of red meat - get blood test (fasting) done 1 week before next visit - increase the amount of fiber in food Follow Up Plan: Telephone follow up appointment with care management team member scheduled for: 10/17/20 at 11:30 AM The patient has been provided with contact information for the care management team and has been advised to call with any health related questions or concerns.        Patient Care Plan: Hypertension (Adult)     Problem Identified: Lack of Hypertension self managment plan   Priority: High     Long-Range Goal: Effective self managment of Hypertension   Start Date: 08/22/2020  Expected End Date: 01/03/2021  This Visit's Progress: On track  Priority: High  Note:   Current Barriers:  Knowledge Deficits related to basic understanding of hypertension pathophysiology and self care management Unable to independently self manage Hypertension States that she checks her B/P at home usually once a week with readings ranging 119/6-140/70.  States her readings have been good since her B/P medication was increased.  States she tries to follow a low sodium diet.  STates she has some stress raising her 73 yr old grandson but states she is handling OK at this time. Nurse Case Manager Clinical Goal(s):  patient will verbalize understanding of plan for hypertension management patient will not experience hospital admission. Hospital Admissions in last 6 months = 0 patient will attend all scheduled medical appointments: Annual Wellness Visit 11/19/20 patient will demonstrate improved adherence to  prescribed treatment plan for hypertension as evidenced by taking all medications as prescribed, monitoring and recording blood pressure as directed, adhering to low sodium/DASH diet patient will demonstrate improved health management independence as evidenced by checking blood pressure as directed and notifying PCP if SBP>160 or DBP > 90, taking all medications as prescribe, and adhering to a low sodium diet as discussed. patient will verbalize basic understanding of hypertension disease process and self health management plan as evidenced by B/P readings within limits, adherence to medications and self monitoring Interventions:  Collaboration with Midge Minium, MD regarding development and update of comprehensive plan of care as evidenced by provider attestation and co-signature Inter-disciplinary care team collaboration (see longitudinal plan of care) Evaluation of current treatment plan related to hypertension self management and patient's adherence to plan as established by provider. Provided education to patient re: stroke prevention, s/s of heart attack and stroke, DASH diet, complications of uncontrolled blood pressure Reviewed medications with patient and discussed importance of compliance Discussed plans with patient for ongoing care management follow up and provided patient with direct contact information for care management team Advised patient, providing education and rationale, to monitor blood pressure weekly or more frequently as needed and record, calling PCP for findings outside established parameters.  Reviewed scheduled/upcoming provider appointments including: Annual Wellness Visit 11/19/20 Reviewed importance of stress management for B/P control and discussed talking to LCSW to help with stress management but declines at this time. Self-Care Activities:  Patient verbalizes understanding of plan to self manage hypertension Self administers medications as prescribed Attends  all scheduled provider appointments Calls pharmacy for medication refills Calls provider  office for new concerns or questions Patient Goals  - Self administer medications as prescribed  - Attend all scheduled provider appointments  - Call provider office for new concerns, questions, or BP outside discussed parameters  - Check BP and record as discussed  - Follows a low sodium diet/DASH diet - check blood pressure weekly - choose a place to take my blood pressure (home, clinic or office, retail store) - write blood pressure results in a log or diary - ask questions to understand Follow Up Plan: Telephone follow up appointment with care management team member scheduled for: 10/17/20 at 11:30 AM The patient has been provided with contact information for the care management team and has been advised to call with any health related questions or concerns.

## 2020-08-28 DIAGNOSIS — M180 Bilateral primary osteoarthritis of first carpometacarpal joints: Secondary | ICD-10-CM | POA: Diagnosis not present

## 2020-08-28 DIAGNOSIS — M79642 Pain in left hand: Secondary | ICD-10-CM | POA: Diagnosis not present

## 2020-08-28 DIAGNOSIS — M1811 Unilateral primary osteoarthritis of first carpometacarpal joint, right hand: Secondary | ICD-10-CM | POA: Diagnosis not present

## 2020-08-28 DIAGNOSIS — M79641 Pain in right hand: Secondary | ICD-10-CM | POA: Diagnosis not present

## 2020-08-31 ENCOUNTER — Emergency Department (HOSPITAL_COMMUNITY)
Admission: EM | Admit: 2020-08-31 | Discharge: 2020-09-01 | Disposition: A | Discharge status: Home / Self Care | Payer: Medicare HMO | Attending: Emergency Medicine | Admitting: Emergency Medicine

## 2020-08-31 ENCOUNTER — Other Ambulatory Visit: Payer: Self-pay

## 2020-08-31 ENCOUNTER — Encounter (HOSPITAL_COMMUNITY): Payer: Self-pay

## 2020-08-31 DIAGNOSIS — I959 Hypotension, unspecified: Secondary | ICD-10-CM | POA: Diagnosis not present

## 2020-08-31 DIAGNOSIS — Z79899 Other long term (current) drug therapy: Secondary | ICD-10-CM | POA: Diagnosis not present

## 2020-08-31 DIAGNOSIS — W5581XA Bitten by other mammals, initial encounter: Secondary | ICD-10-CM | POA: Diagnosis not present

## 2020-08-31 DIAGNOSIS — Z87891 Personal history of nicotine dependence: Secondary | ICD-10-CM | POA: Diagnosis not present

## 2020-08-31 DIAGNOSIS — S61452A Open bite of left hand, initial encounter: Secondary | ICD-10-CM | POA: Diagnosis not present

## 2020-08-31 DIAGNOSIS — S61412A Laceration without foreign body of left hand, initial encounter: Secondary | ICD-10-CM | POA: Diagnosis not present

## 2020-08-31 DIAGNOSIS — R58 Hemorrhage, not elsewhere classified: Secondary | ICD-10-CM | POA: Diagnosis not present

## 2020-08-31 DIAGNOSIS — W540XXA Bitten by dog, initial encounter: Secondary | ICD-10-CM | POA: Diagnosis not present

## 2020-08-31 DIAGNOSIS — I10 Essential (primary) hypertension: Secondary | ICD-10-CM | POA: Diagnosis not present

## 2020-08-31 NOTE — ED Triage Notes (Signed)
Patient BIB [facility] from home. Patient was messing with her small dog and he bite her. 1 inch laceration to left hand. When she moves hand you can see the tendons in the pointer finger. Full range of motion with hand, capillary refill less than 3 seconds. Blood controlled at this time. Dog is up to date on shots.  152/50 HR 80 97% Room Air

## 2020-09-01 MED ORDER — LIDOCAINE-EPINEPHRINE (PF) 2 %-1:200000 IJ SOLN
10.0000 mL | Freq: Once | INTRAMUSCULAR | Status: AC
  Administered 2020-09-01: 10 mL
  Filled 2020-09-01: qty 20

## 2020-09-01 MED ORDER — AMOXICILLIN-POT CLAVULANATE 875-125 MG PO TABS
1.0000 | ORAL_TABLET | Freq: Once | ORAL | Status: AC
  Administered 2020-09-01: 1 via ORAL
  Filled 2020-09-01: qty 1

## 2020-09-01 MED ORDER — AMOXICILLIN-POT CLAVULANATE 875-125 MG PO TABS: 1.0000 | ORAL_TABLET | Freq: Two times a day (BID) | ORAL | 0 refills | Status: DC

## 2020-09-01 NOTE — ED Provider Notes (Signed)
[facility]-EMERGENCY DEPT Provider Note   CSN: [id number] Arrival date & time: 08/31/20  2305     History Chief Complaint  Patient presents with   Animal Bite    Regina Gonzalez is a 75 y.o. female.  The history is provided by the patient.  Animal Bite She has history of hypertension, hyperlipidemia, TIA and comes in after being bitten by her dog on her left hand.  She states that the dog have been vomiting and she went to get the dog and its snapped at her.  Dog is up-to-date on all of its immunizations and patient's last tetanus immunization was within the past year.   Past Medical History:  Diagnosis Date   Allergic rhinitis    Anxiety    Arthritis    HANDS   Benign liver cyst    multiple   Chronic constipation    Depression    Diverticulosis of colon    Emphysematous COPD (HCC)    External hemorrhoid    GERD (gastroesophageal reflux disease)    History of colon polyps    History of Helicobacter pylori infection    History of TIA (transient ischemic attack)    apr 2013-- no residual   Hyperlipidemia    Occipital neuralgia of left side 12/08/2014   Osteoporosis    Pancreatic cyst    simple benign   PONV (postoperative nausea and vomiting)    Prolapsed internal hemorrhoids, grade 3    Simple renal cyst    right    Wears glasses    Wears partial dentures    UPPER    Patient Active Problem List   Diagnosis Date Noted   Palpitations 12/05/2019   HTN (hypertension) 10/27/2018   Fibromyalgia 01/06/2018   Gout 07/07/2017   Moderate episode of recurrent major depressive disorder (HCC) 06/19/2017   Female bladder prolapse 03/15/2015   Trapezius muscle spasm 12/04/2014   Lateral epicondylitis of right elbow 12/04/2014   History of tobacco abuse 09/15/2014   Memory loss 05/11/2014   Postmenopausal bleeding 07/08/2013   External hemorrhoid, bleeding 04/29/2013   Osteoporosis 07/05/2012   TIA (transient ischemic attack) 07/05/2012    Routine general medical examination at a health care facility 07/05/2012   Hypopigmentation 09/01/2010   GERD 04/15/2010   IRRITABLE BOWEL SYNDROME 04/15/2010   FLATULENCE ERUCTATION AND GAS PAIN 04/15/2010   SHORTNESS OF BREATH 02/20/2010   HELICOBACTER PYLORI GASTRITIS, HX OF 01/10/2010   Hyperlipidemia 12/06/2009   Depression 12/06/2009   ALLERGIC RHINITIS 12/06/2009   BARRETTS ESOPHAGUS 12/06/2009    Past Surgical History:  Procedure Laterality Date   APPENDECTOMY  1960   BREAST EXCISIONAL BIOPSY Left    BREAST EXCISIONAL BIOPSY Right    COLONOSCOPY WITH PROPOFOL  last one 10-25-2013   polypectomy   D & C HYSTEROSCOPY W/ ENDOMETRIAL POLYPECTOMY     ESOPHAGOGASTRODUODENOSCOPY  last one 03-04-2013   EXCISION BILATERAL BREAST CYST  1995   HEMORRHOID SURGERY N/A 04/06/2014   Procedure: Hemmorhoidopexy with anal canal biopsy;  Surgeon: Romie Levee, MD;  Location: [facility];  Service: General;  Laterality: N/A;   LAPAROSCOPIC RIGHT OVARIAN CYSTECTOMY/  D & C HYSTEROSCOPY ENDOMETRIAL POLYPECTOMY  2011   TONSILLECTOMY AND ADENOIDECTOMY  1955   TRANSTHORACIC ECHOCARDIOGRAM  08-13-2012   normal LV/  ef 60-65%/  mild AR/  trivial MR     OB History     Gravida  3   Para  2   Term  Preterm      AB  1   Living  2      SAB      IAB      Ectopic      Multiple      Live Births           Obstetric Comments  Younger daughter passes away from pancreatitis- 11/2016         Family History  Problem Relation Age of Onset   Alcoholism Father    Diabetes type II Brother    Heart attack Brother    Cirrhosis Mother        non-alcoholic   Multiple sclerosis Daughter    Pancreatitis Daughter    Colon cancer Neg Hx     Social History   Tobacco Use   Smoking status: Former    Packs/day: 1.00    Years: 17.00    Pack years: 17.00    Types: Cigarettes    Quit date: 04/02/1988    Years since quitting: 32.4   Smokeless tobacco: Never   Vaping Use   Vaping Use: Never used  Substance Use Topics   Alcohol use: No   Drug use: No    Home Medications Prior to Admission medications   Medication Sig Start Date End Date Taking? Authorizing Provider  Ascorbic Acid (VITAMIN C PO) Take by mouth daily.   Yes [provider]  Calcium Carb-Cholecalciferol (CALCIUM 600 + D PO) Take 2 tablets by mouth daily.   Yes [provider]  cyanocobalamin 500 MCG tablet Take 500 mcg by mouth daily.   Yes [provider]  diclofenac Sodium (VOLTAREN) 1 % GEL Apply 2 g topically 4 (four) times daily. Patient taking differently: Apply 2 g topically 4 (four) times daily as needed. 12/07/19  Yes Waldon Merl, PA-C  dicyclomine (BENTYL) 10 MG capsule Take 10 mg by mouth daily as needed.   Yes [provider]  Docusate Calcium (STOOL SOFTENER PO) Take by mouth.   Yes [provider]  DULoxetine (CYMBALTA) 60 MG capsule TAKE ONE CAPSULE BY MOUTH DAILY 11/28/19  Yes Sheliah Hatch, MD  loratadine (CLARITIN) 10 MG tablet Take 1 tablet (10 mg total) by mouth daily. Patient taking differently: Take 10 mg by mouth daily as needed. 01/09/20  Yes Sheliah Hatch, MD  losartan (COZAAR) 50 MG tablet TAKE 1 TABLET BY MOUTH DAILY 08/20/20  Yes Sheliah Hatch, MD  Multiple Vitamins-Minerals (MULTIVITAMIN WITH MINERALS) tablet Take 1 tablet by mouth daily.    Yes [provider]  pantoprazole (PROTONIX) 40 MG tablet TAKE ONE TABLET BY MOUTH TWICE A DAY Patient taking differently: Take 40 mg by mouth daily. 04/13/20  Yes Sheliah Hatch, MD  simvastatin (ZOCOR) 20 MG tablet Take 1 tablet (20 mg total) by mouth at bedtime. 06/01/20  Yes Sheliah Hatch, MD  ALPRAZolam (XANAX) 1 MG tablet TAKE 1/2 TO 1 TABLET BY MOUTH TWO TIMES A DAY TO THREE TIMES A DAY AS NEEDED FOR ANXIETY Patient taking differently: Take 0.5 mg by mouth at bedtime as needed. 08/21/20   Sheliah Hatch, MD    Allergies     Codeine, Erythromycin, and Flagyl [metronidazole]  Review of Systems   Review of Systems  All other systems reviewed and are negative.  Physical Exam Updated Vital Signs BP 133/67   Pulse 85   Temp 98.3 F (36.8 C) (Oral)   Resp 17   Ht 5\' 2"  (1.575 m)  Wt 52 kg   LMP 06/16/1997   SpO2 100%   BMI 20.97 kg/m   Physical Exam Vitals and nursing note reviewed.  75 year old female, resting comfortably and in no acute distress. Vital signs are normal. Oxygen saturation is 100%, which is normal. Head is normocephalic and atraumatic. PERRLA, EOMI. Oropharynx is clear. Neck is nontender and supple without adenopathy or JVD. Lungs are clear without rales, wheezes, or rhonchi. Chest is nontender. Heart has regular rate and rhythm without murmur. Abdomen is soft, flat, nontender. Extremities: Laceration present on the dorsum of the left hand.  Extensor tendon for the index finger is visible without any obvious injury.  There is normal strength of extension of the index finger against resistance.  There is normal sensation and prompt capillary refill Skin is warm and dry without rash. Neurologic: Mental status is normal, cranial nerves are intact, moves all extremities equally.   ED Results / Procedures / Treatments    Procedures .Marland Kitchen.Laceration Repair  Date/Time: 09/01/2020 1:45 AM Performed by: Dione BoozeGlick, Acasia Skilton, MD Authorized by: Dione BoozeGlick, Fran Neiswonger, MD   Consent:    Consent obtained:  Verbal   Consent given by:  Patient   Risks discussed:  Infection, pain and tendon damage   Alternatives discussed:  Delayed treatment Universal protocol:    Procedure explained and questions answered to patient or proxy's satisfaction: yes     Relevant documents present and verified: yes     Required blood products, implants, devices, and special equipment available: yes     Site/side marked: yes     Immediately prior to procedure, a time out was called: yes     Patient identity confirmed:  Verbally  with patient and arm band Anesthesia:    Anesthesia method:  Local infiltration   Local anesthetic:  Lidocaine 2% WITH epi Laceration details:    Location:  Hand   Hand location:  L hand, dorsum   Length (cm):  2.5   Depth (mm):  2 Pre-procedure details:    Preparation:  Patient was prepped and draped in usual sterile fashion Exploration:    Limited defect created (wound extended): no     Hemostasis achieved with:  Direct pressure   Wound exploration: wound explored through full range of motion and entire depth of wound visualized     Wound extent: no foreign bodies/material noted, no nerve damage noted and no tendon damage noted     Wound extent comment:  Extensor tendon visualized, no injury seen   Contaminated: no   Treatment:    Area cleansed with:  Saline   Amount of cleaning:  Standard   Irrigation solution:  Sterile saline   Irrigation volume:  500 ml   Irrigation method:  Syringe   Debridement:  None   Undermining:  None Skin repair:    Repair method:  Sutures   Suture size:  4-0   Suture material:  Prolene   Suture technique:  Simple interrupted   Number of sutures:  2 Approximation:    Approximation:  Loose Repair type:    Repair type:  Simple Post-procedure details:    Dressing:  Antibiotic ointment and sterile dressing   Procedure completion:  Tolerated well, no immediate complications   Medications Ordered in ED Medications  lidocaine-EPINEPHrine (XYLOCAINE W/EPI) 2 %-1:200000 (PF) injection 10 mL (has no administration in time range)  amoxicillin-clavulanate (AUGMENTIN) 875-125 MG per tablet 1 tablet (has no administration in time range)    ED Course  I have reviewed the  triage vital signs and the nursing notes.  MDM Rules/Calculators/A&P                         Laceration of left hand from dog bite.  Extensor tendon is exposed without obvious injury.  Laceration is irrigated and closed loosely and she is discharged with prescription for  amoxicillin-clavulanic acid.  She will be referred to hand surgery for follow-up.  Old records reviewed confirming tetanus immunization last November.  Final Clinical Impression(s) / ED Diagnoses Final diagnoses:  None    Rx / DC Orders ED Discharge Orders     None        Dione Booze, MD 09/01/20 [phone]

## 2020-09-01 NOTE — Discharge Instructions (Addendum)
Please change the dressing once a day.  Apply an antibiotic ointment such as bacitracin or Neosporin when you change the dressing.  Follow-up with the hand specialist to make sure that the tendon is functioning appropriately.  If you see signs of infection develop, return immediately.

## 2020-09-02 DIAGNOSIS — S61452S Open bite of left hand, sequela: Secondary | ICD-10-CM | POA: Diagnosis not present

## 2020-09-02 DIAGNOSIS — S61452D Open bite of left hand, subsequent encounter: Secondary | ICD-10-CM | POA: Diagnosis not present

## 2020-09-02 DIAGNOSIS — W540XXD Bitten by dog, subsequent encounter: Secondary | ICD-10-CM | POA: Diagnosis not present

## 2020-09-02 DIAGNOSIS — W540XXS Bitten by dog, sequela: Secondary | ICD-10-CM | POA: Diagnosis not present

## 2020-09-06 DIAGNOSIS — S61451A Open bite of right hand, initial encounter: Secondary | ICD-10-CM | POA: Diagnosis not present

## 2020-09-13 DIAGNOSIS — S61451A Open bite of right hand, initial encounter: Secondary | ICD-10-CM | POA: Diagnosis not present

## 2020-10-09 DIAGNOSIS — H33002 Unspecified retinal detachment with retinal break, left eye: Secondary | ICD-10-CM | POA: Diagnosis not present

## 2020-10-10 DIAGNOSIS — H2513 Age-related nuclear cataract, bilateral: Secondary | ICD-10-CM | POA: Diagnosis not present

## 2020-10-10 DIAGNOSIS — M25641 Stiffness of right hand, not elsewhere classified: Secondary | ICD-10-CM | POA: Diagnosis not present

## 2020-10-10 DIAGNOSIS — H31092 Other chorioretinal scars, left eye: Secondary | ICD-10-CM | POA: Diagnosis not present

## 2020-10-10 DIAGNOSIS — H401424 Capsular glaucoma with pseudoexfoliation of lens, left eye, indeterminate stage: Secondary | ICD-10-CM | POA: Diagnosis not present

## 2020-10-17 ENCOUNTER — Ambulatory Visit (INDEPENDENT_AMBULATORY_CARE_PROVIDER_SITE_OTHER): Payer: Medicare HMO

## 2020-10-17 DIAGNOSIS — I10 Essential (primary) hypertension: Secondary | ICD-10-CM

## 2020-10-17 DIAGNOSIS — E7800 Pure hypercholesterolemia, unspecified: Secondary | ICD-10-CM

## 2020-10-17 NOTE — Patient Instructions (Signed)
Visit Information  PATIENT GOALS:  Goals Addressed             This Visit's Progress    RNCM:Manage My Cholesterol   On track    Timeframe:  Long-Range Goal Priority:  Medium Start Date:      08/22/20                       Expected End Date:   13/1/23                    Follow Up Date 01/09/21    - change to whole grain breads, cereal, pasta - eat smaller or less servings of red meat - fill half the plate with nonstarchy vegetables - get blood test (fasting) done 1 week before next visit - increase the amount of fiber in food - read food labels for fat and fiber - switch to low-fat or skim milk    Why is this important?   Changing cholesterol starts with eating heart-healthy foods.  Other steps may be to increase your activity and to quit if you smoke.    Notes:      RNCM:Track and Manage My Blood Pressure-Hypertension   On track    Timeframe:  Long-Range Goal Priority:  High Start Date:      08/22/20                       Expected End Date:   04/03/21                    Follow Up Date 01/09/21    - check blood pressure weekly - choose a place to take my blood pressure (home, clinic or office, retail store) - write blood pressure results in a log or diary    Why is this important?   You won't feel high blood pressure, but it can still hurt your blood vessels.  High blood pressure can cause heart or kidney problems. It can also cause a stroke.  Making lifestyle changes like losing a little weight or eating less salt will help.  Checking your blood pressure at home and at different times of the day can help to control blood pressure.  If the doctor prescribes medicine remember to take it the way the doctor ordered.  Call the office if you cannot afford the medicine or if there are questions about it.     Notes:         Patient verbalizes understanding of instructions provided today and agrees to view in MyChart.   Telephone follow up appointment with care  management team member scheduled for: 01/09/21 at 11:30 AM  Dudley Major RN, [facility], CDE Care Management Coordinator [facility] [phone], Mobile [phone]

## 2020-10-17 NOTE — Chronic Care Management (AMB) (Signed)
Chronic Care Management   CCM RN Visit Note  10/17/2020 Name: Regina Gonzalez MRN: [id number] DOB: 1945-09-24  Subjective: Regina Gonzalez is a 75 y.o. year old female who is a primary care patient of Beverely Low, Helane Rima, MD. The care management team was consulted for assistance with disease management and care coordination needs.    Engaged with patient by telephone for follow up visit in response to provider referral for case management and/or care coordination services.   Consent to Services:  The patient was given information about Chronic Care Management services, agreed to services, and gave verbal consent prior to initiation of services.  Please see initial visit note for detailed documentation.   Patient agreed to services and verbal consent obtained.   Assessment: Review of patient past medical history, allergies, medications, health status, including review of consultants reports, laboratory and other test data, was performed as part of comprehensive evaluation and provision of chronic care management services.   SDOH (Social Determinants of Health) assessments and interventions performed:    CCM Care Plan  Allergies  Allergen Reactions   Codeine Anaphylaxis   Erythromycin Other (See Comments)    "severe GI upset, also all "mycins" meds    Flagyl [Metronidazole] Other (See Comments)    "flu-like symptoms"    Outpatient Encounter Medications as of 10/17/2020  Medication Sig   ALPRAZolam (XANAX) 1 MG tablet TAKE 1/2 TO 1 TABLET BY MOUTH TWO TIMES A DAY TO THREE TIMES A DAY AS NEEDED FOR ANXIETY (Patient taking differently: Take 0.5 mg by mouth at bedtime as needed.)   amoxicillin-clavulanate (AUGMENTIN) 875-125 MG tablet Take 1 tablet by mouth every 12 (twelve) hours.   Ascorbic Acid (VITAMIN C PO) Take by mouth daily.   Calcium Carb-Cholecalciferol (CALCIUM 600 + D PO) Take 2 tablets by mouth daily.   cyanocobalamin 500 MCG tablet Take 500 mcg by mouth  daily.   diclofenac Sodium (VOLTAREN) 1 % GEL Apply 2 g topically 4 (four) times daily. (Patient taking differently: Apply 2 g topically 4 (four) times daily as needed.)   dicyclomine (BENTYL) 10 MG capsule Take 10 mg by mouth daily as needed.   Docusate Calcium (STOOL SOFTENER PO) Take by mouth.   DULoxetine (CYMBALTA) 60 MG capsule TAKE ONE CAPSULE BY MOUTH DAILY   loratadine (CLARITIN) 10 MG tablet Take 1 tablet (10 mg total) by mouth daily. (Patient taking differently: Take 10 mg by mouth daily as needed.)   losartan (COZAAR) 50 MG tablet TAKE 1 TABLET BY MOUTH DAILY   Multiple Vitamins-Minerals (MULTIVITAMIN WITH MINERALS) tablet Take 1 tablet by mouth daily.    pantoprazole (PROTONIX) 40 MG tablet TAKE ONE TABLET BY MOUTH TWICE A DAY (Patient taking differently: Take 40 mg by mouth daily.)   simvastatin (ZOCOR) 20 MG tablet Take 1 tablet (20 mg total) by mouth at bedtime.   No facility-administered encounter medications on file as of 10/17/2020.    Patient Active Problem List   Diagnosis Date Noted   Palpitations 12/05/2019   HTN (hypertension) 10/27/2018   Fibromyalgia 01/06/2018   Gout 07/07/2017   Moderate episode of recurrent major depressive disorder (HCC) 06/19/2017   Female bladder prolapse 03/15/2015   Trapezius muscle spasm 12/04/2014   Lateral epicondylitis of right elbow 12/04/2014   History of tobacco abuse 09/15/2014   Memory loss 05/11/2014   Postmenopausal bleeding 07/08/2013   External hemorrhoid, bleeding 04/29/2013   Osteoporosis 07/05/2012   TIA (transient ischemic attack) 07/05/2012   Routine general medical examination  at a health care facility 07/05/2012   Hypopigmentation 09/01/2010   GERD 04/15/2010   IRRITABLE BOWEL SYNDROME 04/15/2010   FLATULENCE ERUCTATION AND GAS PAIN 04/15/2010   SHORTNESS OF BREATH 02/20/2010   HELICOBACTER PYLORI GASTRITIS, HX OF 01/10/2010   Hyperlipidemia 12/06/2009   Depression 12/06/2009   ALLERGIC RHINITIS 12/06/2009    BARRETTS ESOPHAGUS 12/06/2009    Conditions to be addressed/monitored:HTN and HLD  Care Plan : Hyperlipidemia  Updates made by Yetta Glassman, RN since 10/17/2020 12:00 AM     Problem: Lack of Hyperlipidemia self management plan   Priority: Medium     Long-Range Goal: Effective Hyperlipidemia Self-Management   Start Date: 08/22/2020  Expected End Date: 04/03/2021  This Visit's Progress: On track  Recent Progress: On track  Priority: Medium  Note:   Current Barriers:  Poorly controlled hyperlipidemia, complicated by HTN, fibromyalgia, osteoarthritis  Current antihyperlipidemic regimen: simvastatin Most recent lipid panel:     Component Value Date/Time   CHOL 160 05/16/2020 1042   TRIG 37.0 05/16/2020 1042   HDL 80.70 05/16/2020 1042   CHOLHDL 2 05/16/2020 1042   VLDL 7.4 05/16/2020 1042   LDLCALC 72 05/16/2020 1042   ASCVD risk enhancing conditions: age >87, HTN Unable to independently self manage hyperlipidemia  States that she tries to eat healthy but she still likes to eat sweets such as cakes, pies and candy.  States she does not exercise regularly but she is very active getting 7000-8000 steps a day and walks. RN Care Manager Clinical Goal(s):  patient will work with Medical illustrator, providers, and care team towards execution of optimized self-health management plan patient will verbalize understanding of plan for self management of Hyperlipidemia patient will meet with RN Care Manager to address self management of Hyperlipidemia patient will take all medications exactly as prescribed and will call provider for medication related questions patient will attend all scheduled medical appointments: Annual Wellness visit 11/19/20, Dr. Beverely Low 12/11/20 patient will verbalize basic understanding of self management of Hyperlipidemia disease process and self health management plan patient will demonstrate understanding of rationale for each prescribed  medication Interventions: Collaboration with Sheliah Hatch, MD regarding development and update of comprehensive plan of care as evidenced by provider attestation and co-signature Inter-disciplinary care team collaboration (see longitudinal plan of care) Medication review performed; medication list updated in electronic medical record.  Inter-disciplinary care team collaboration (see longitudinal plan of care) Referred to pharmacy team for assistance with HLD medication management Evaluation of current treatment plan related to self management of Hyperlipidemia and patient's adherence to plan as established by provider. Provided education to patient re: self management of Hyperlipidemia Provided patient with written educational materials related to self management of Hyperlipidemia Reviewed scheduled/upcoming provider appointments including:  Discussed plans with patient for ongoing care management follow up and provided patient with direct contact information for care management team Reinforced how sweets can effect her cholesterol and triglycerides and to try to limit  Reinforced importance of regular exercise and reviewed fitness benefit with her insurance  Reviewed to avoid saturated fats, trans-fats and eat more fiber Reviewed to take statin as ordered Reviewed to lower fatty foods, red meat, cheese, milk and increase fiber like whole grains and vegetables. Patient Goals/Self-Care Activities: - call for medicine refill 2 or 3 days before it runs out - keep a list of all the medicines I take; vitamins and herbals too - learn to read medicine labels - change to whole grain breads, cereal, pasta - drink  6 to 8 glasses of water each day - eat 3 to 5 servings of fruits and vegetables each day - fill half the plate with nonstarchy vegetables - limit fast food meals to no more than 1 per week - read food labels for fat, fiber, carbohydrates and portion size - switch to low-fat or skim  milk - if I have chest pain, call for help - learn about small changes that will make a big difference - change to whole grain breads, cereal, pasta - eat smaller or less servings of red meat - get blood test (fasting) done 1 week before next visit - increase the amount of fiber in food Follow Up Plan: Telephone follow up appointment with care management team member scheduled for: 01/09/21 at 11:30 AM The patient has been provided with contact information for the care management team and has been advised to call with any health related questions or concerns.        Care Plan : Hypertension (Adult)  Updates made by Yetta Glassman, RN since 10/17/2020 12:00 AM     Problem: Lack of Hypertension self managment plan   Priority: High     Long-Range Goal: Effective self managment of Hypertension   Start Date: 08/22/2020  Expected End Date: 04/03/2021  This Visit's Progress: On track  Recent Progress: On track  Priority: High  Note:   Current Barriers:  Knowledge Deficits related to basic understanding of hypertension pathophysiology and self care management Unable to independently self manage Hypertension States that she checks her B/P at home usually once a week with readings ranging 115-123/50-60.  States her readings have been good since her B/P medication was increased.  States she tries to follow a low sodium diet.  States her stress is improved at this time,  States her grandson is now driving and he drives himself to school.  States she has recovered from the dog bite she had to her lt hand and her rt hand is also better since her hand surgery in February.  States she is going to have cataract surgery on 10/27/2 and she has someone to drive her to her surgery appointment Nurse Case Manager Clinical Goal(s):  patient will verbalize understanding of plan for hypertension management patient will not experience hospital admission. Hospital Admissions in last 6 months = 0 patient will  attend all scheduled medical appointments: Annual Wellness Visit 11/19/20, Dr. Beverely Low 12/11/20 patient will demonstrate improved adherence to prescribed treatment plan for hypertension as evidenced by taking all medications as prescribed, monitoring and recording blood pressure as directed, adhering to low sodium/DASH diet patient will demonstrate improved health management independence as evidenced by checking blood pressure as directed and notifying PCP if SBP>160 or DBP > 90, taking all medications as prescribe, and adhering to a low sodium diet as discussed. patient will verbalize basic understanding of hypertension disease process and self health management plan as evidenced by B/P readings within limits, adherence to medications and self monitoring Interventions:  Collaboration with Sheliah Hatch, MD regarding development and update of comprehensive plan of care as evidenced by provider attestation and co-signature Inter-disciplinary care team collaboration (see longitudinal plan of care) Evaluation of current treatment plan related to hypertension self management and patient's adherence to plan as established by provider. Reinforced stroke prevention, s/s of heart attack and stroke, DASH diet, complications of uncontrolled blood pressure Reviewed medications with patient and discussed importance of compliance Discussed plans with patient for ongoing care management follow up and provided patient  with direct contact information for care management team Reinforced to monitor blood pressure weekly or more frequently as needed and record, calling PCP for findings outside established parameters.  Reviewed scheduled/upcoming provider appointments including: Annual Wellness Visit 11/19/20 Dr. Beverely Low 12/11/20 Reviewed importance of getting annual flu shot and COVID booster Self-Care Activities:  Patient verbalizes understanding of plan to self manage hypertension Self administers medications as  prescribed Attends all scheduled provider appointments Calls pharmacy for medication refills Calls provider office for new concerns or questions Patient Goals  - Self administer medications as prescribed  - Attend all scheduled provider appointments  - Call provider office for new concerns, questions, or BP outside discussed parameters  - Check BP and record as discussed  - Follows a low sodium diet/DASH diet - check blood pressure weekly - choose a place to take my blood pressure (home, clinic or office, retail store) - write blood pressure results in a log or diary - ask questions to understand Follow Up Plan: Telephone follow up appointment with care management team member scheduled for: 01/09/21 at 11:30 AM The patient has been provided with contact information for the care management team and has been advised to call with any health related questions or concerns.       Plan:Telephone follow up appointment with care management team member scheduled for:  01/09/21 and The patient has been provided with contact information for the care management team and has been advised to call with any health related questions or concerns.  Dudley Major RN, BSN,CCM, CDE Care Management Coordinator [facility] [phone], Mobile [phone]

## 2020-10-31 DIAGNOSIS — H401421 Capsular glaucoma with pseudoexfoliation of lens, left eye, mild stage: Secondary | ICD-10-CM | POA: Diagnosis not present

## 2020-10-31 DIAGNOSIS — H2513 Age-related nuclear cataract, bilateral: Secondary | ICD-10-CM | POA: Diagnosis not present

## 2020-10-31 DIAGNOSIS — H31092 Other chorioretinal scars, left eye: Secondary | ICD-10-CM | POA: Diagnosis not present

## 2020-11-02 DIAGNOSIS — I10 Essential (primary) hypertension: Secondary | ICD-10-CM

## 2020-11-02 DIAGNOSIS — E7800 Pure hypercholesterolemia, unspecified: Secondary | ICD-10-CM | POA: Diagnosis not present

## 2020-11-17 ENCOUNTER — Other Ambulatory Visit: Payer: Self-pay | Admitting: Family Medicine

## 2020-11-19 ENCOUNTER — Ambulatory Visit: Payer: Medicare Other

## 2020-11-20 ENCOUNTER — Other Ambulatory Visit: Payer: Self-pay

## 2020-11-20 MED ORDER — PANTOPRAZOLE SODIUM 40 MG PO TBEC: 40.0000 mg | DELAYED_RELEASE_TABLET | Freq: Two times a day (BID) | ORAL | 0 refills | Status: DC

## 2020-11-22 DIAGNOSIS — M180 Bilateral primary osteoarthritis of first carpometacarpal joints: Secondary | ICD-10-CM | POA: Diagnosis not present

## 2020-11-22 DIAGNOSIS — M65311 Trigger thumb, right thumb: Secondary | ICD-10-CM | POA: Diagnosis not present

## 2020-11-29 DIAGNOSIS — H401121 Primary open-angle glaucoma, left eye, mild stage: Secondary | ICD-10-CM | POA: Diagnosis not present

## 2020-11-29 DIAGNOSIS — H2512 Age-related nuclear cataract, left eye: Secondary | ICD-10-CM | POA: Diagnosis not present

## 2020-12-10 ENCOUNTER — Other Ambulatory Visit: Payer: Self-pay | Admitting: Family Medicine

## 2020-12-10 DIAGNOSIS — E7800 Pure hypercholesterolemia, unspecified: Secondary | ICD-10-CM

## 2020-12-11 ENCOUNTER — Encounter: Payer: Self-pay | Admitting: Family Medicine

## 2020-12-11 ENCOUNTER — Ambulatory Visit (INDEPENDENT_AMBULATORY_CARE_PROVIDER_SITE_OTHER): Payer: Medicare Other | Admitting: Family Medicine

## 2020-12-11 VITALS — BP 118/60 | HR 67 | Temp 98.1°F | Resp 15 | Wt 118.8 lb

## 2020-12-11 DIAGNOSIS — F331 Major depressive disorder, recurrent, moderate: Secondary | ICD-10-CM | POA: Diagnosis not present

## 2020-12-11 DIAGNOSIS — I10 Essential (primary) hypertension: Secondary | ICD-10-CM | POA: Diagnosis not present

## 2020-12-11 DIAGNOSIS — E7800 Pure hypercholesterolemia, unspecified: Secondary | ICD-10-CM

## 2020-12-11 LAB — TSH: TSH: 1.16 u[IU]/mL (ref 0.35–5.50)

## 2020-12-11 LAB — CBC WITH DIFFERENTIAL/PLATELET
Basophils Absolute: 0 10*3/uL (ref 0.0–0.1)
Basophils Relative: 0.6 % (ref 0.0–3.0)
Eosinophils Absolute: 0.2 10*3/uL (ref 0.0–0.7)
Eosinophils Relative: 4 % (ref 0.0–5.0)
HCT: 40.1 % (ref 36.0–46.0)
Hemoglobin: 13.7 g/dL (ref 12.0–15.0)
Lymphocytes Relative: 40.1 % (ref 12.0–46.0)
Lymphs Abs: 1.7 10*3/uL (ref 0.7–4.0)
MCHC: 34.2 g/dL (ref 30.0–36.0)
MCV: 94.2 fl (ref 78.0–100.0)
Monocytes Absolute: 0.4 10*3/uL (ref 0.1–1.0)
Monocytes Relative: 8.7 % (ref 3.0–12.0)
Neutro Abs: 1.9 10*3/uL (ref 1.4–7.7)
Neutrophils Relative %: 46.6 % (ref 43.0–77.0)
Platelets: 229 10*3/uL (ref 150.0–400.0)
RBC: 4.26 Mil/uL (ref 3.87–5.11)
RDW: 13.4 % (ref 11.5–15.5)
WBC: 4.1 10*3/uL (ref 4.0–10.5)

## 2020-12-11 LAB — BASIC METABOLIC PANEL
BUN: 11 mg/dL (ref 6–23)
CO2: 31 mEq/L (ref 19–32)
Calcium: 9.6 mg/dL (ref 8.4–10.5)
Chloride: 102 mEq/L (ref 96–112)
Creatinine, Ser: 0.91 mg/dL (ref 0.40–1.20)
GFR: 62.64 mL/min (ref >60.00)
Glucose, Bld: 52 mg/dL — ABNORMAL LOW (ref 70–99)
Potassium: 4.1 mEq/L (ref 3.5–5.1)
Sodium: 140 mEq/L (ref 135–145)

## 2020-12-11 LAB — HEPATIC FUNCTION PANEL
ALT: 16 U/L (ref 0–35)
AST: 19 U/L (ref 0–37)
Albumin: 4.4 g/dL (ref 3.5–5.2)
Alkaline Phosphatase: 49 U/L (ref 39–117)
Bilirubin, Direct: 0.1 mg/dL (ref 0.0–0.3)
Total Bilirubin: 0.4 mg/dL (ref 0.2–1.2)
Total Protein: 6.4 g/dL (ref 6.0–8.3)

## 2020-12-11 LAB — LIPID PANEL
Cholesterol: 208 mg/dL — ABNORMAL HIGH (ref 0–200)
HDL: 88.4 mg/dL (ref >39.00)
LDL Cholesterol: 108 mg/dL — ABNORMAL HIGH (ref 0–99)
NonHDL: 119.5
Total CHOL/HDL Ratio: 2
Triglycerides: 56 mg/dL (ref 0.0–149.0)
VLDL: 11.2 mg/dL (ref 0.0–40.0)

## 2020-12-11 NOTE — Assessment & Plan Note (Signed)
Deteriorated.  Pt is currently on Cymbalta but struggling w/ her 'place' now that her husband has died, her youngest daughter has died, her oldest daughter is busy with her own family, and her grandson no longer needs her as much.  Feels lonely but at the same time prefers to be by herself.  Encouraged her to start counseling to talk through some of these emotions.  Referral placed.

## 2020-12-11 NOTE — Patient Instructions (Signed)
Schedule your complete physical in 6 months We'll notify you of your lab results and make any changes if needed Continue to work on healthy diet and regular exercise- you're doing great! We'll call you to schedule a counseling appt Call with any questions or concerns Stay Safe!  Stay Healthy! Happy Holidays!!!

## 2020-12-11 NOTE — Assessment & Plan Note (Signed)
Chronic problem.  Well controlled on Losartan 50mg  daily.  Currently asymptomatic.  Check labs due to ARB but no anticipated med changes.

## 2020-12-11 NOTE — Assessment & Plan Note (Signed)
Chronic problem.  Tolerating statin w/o difficulty.  Check labs.  Adjust meds prn  

## 2020-12-11 NOTE — Progress Notes (Signed)
   Subjective:    Patient ID: Regina Gonzalez, female    DOB: [birth year], 75 y.o.   MRN: [id number]  HPI HTN- chronic problem, on Losartan 50mg  daily w/ good control.  No CP, SOB, HAs, visual changes, edema.   Hyperlipidemia- chronic problem, on Simvastatin 20mg  daily.  Denies abd pain, N/V.  Depression- ongoing issue.  Currently on Cymbalta 60mg  daily.  She feels lonely at times.  Feels a bit 'displaced'.  Older daughter is busy and they are not as close.  Younger daughter passed away.  Has grandson that lives w/ her but now that he's 16 he doesn't need her as much.  Not interested in more medication.  Sleeping well, eating well.  R wrist nodule- soft tissue lump, not painful.  Unclear how long it has been present.   Review of Systems For ROS see HPI   This visit occurred during the SARS-CoV-2 public health emergency.  Safety protocols were in place, including screening questions prior to the visit, additional usage of staff PPE, and extensive cleaning of exam room while observing appropriate contact time as indicated for disinfecting solutions.      Objective:   Physical Exam Vitals reviewed.  Constitutional:      General: She is not in acute distress.    Appearance: Normal appearance. She is well-developed. She is not ill-appearing.  HENT:     Head: Normocephalic and atraumatic.  Eyes:     Conjunctiva/sclera: Conjunctivae normal.     Pupils: Pupils are equal, round, and reactive to light.  Neck:     Thyroid: No thyromegaly.  Cardiovascular:     Rate and Rhythm: Normal rate and regular rhythm.     Pulses: Normal pulses.     Heart sounds: Normal heart sounds. No murmur heard. Pulmonary:     Effort: Pulmonary effort is normal. No respiratory distress.     Breath sounds: Normal breath sounds.  Abdominal:     General: There is no distension.     Palpations: Abdomen is soft.     Tenderness: There is no abdominal tenderness.  Musculoskeletal:        General: No  deformity (no obvious soft tissue mass or lump of R wrist).     Cervical back: Normal range of motion and neck supple.     Right lower leg: No edema.     Left lower leg: No edema.  Lymphadenopathy:     Cervical: No cervical adenopathy.  Skin:    General: Skin is warm and dry.  Neurological:     Mental Status: She is alert and oriented to person, place, and time.  Psychiatric:        Behavior: Behavior normal.          Assessment & Plan:

## 2020-12-21 ENCOUNTER — Encounter: Payer: Self-pay | Admitting: Registered Nurse

## 2020-12-21 ENCOUNTER — Ambulatory Visit (INDEPENDENT_AMBULATORY_CARE_PROVIDER_SITE_OTHER): Payer: Medicare Other | Admitting: Registered Nurse

## 2020-12-21 VITALS — BP 124/68 | HR 68 | Temp 98.4°F | Ht 62.0 in | Wt 119.6 lb

## 2020-12-21 DIAGNOSIS — R310 Gross hematuria: Secondary | ICD-10-CM | POA: Diagnosis not present

## 2020-12-21 DIAGNOSIS — R300 Dysuria: Secondary | ICD-10-CM | POA: Diagnosis not present

## 2020-12-21 LAB — POCT URINALYSIS DIPSTICK
Bilirubin, UA: NEGATIVE
Blood, UA: 7.5
Glucose, UA: NEGATIVE
Ketones, UA: 1.005
Nitrite, UA: NEGATIVE
Protein, UA: NEGATIVE
Spec Grav, UA: 1.01 (ref 1.010–1.025)
Urobilinogen, UA: NEGATIVE E.U./dL — AB
pH, UA: 6.5 (ref 5.0–8.0)

## 2020-12-21 MED ORDER — SULFAMETHOXAZOLE-TRIMETHOPRIM 800-160 MG PO TABS: 1.0000 | ORAL_TABLET | Freq: Two times a day (BID) | ORAL | 0 refills | Status: DC

## 2020-12-21 NOTE — Progress Notes (Signed)
Established Patient Office Visit  Subjective:  Patient ID: Regina Gonzalez, female    DOB: Jul 04, 1945  Age: 75 y.o. MRN: [id number]  CC:  Chief Complaint  Patient presents with   Urinary Tract Infection    Possible UTI, dysuria some blood in urine symptoms started this morning.     HPI [name] presents for UTI  Onset this morning with dysuria and gross hematuria No suprapubic pressure or flank pain No systemic symptoms Hx of UTI, none since March. No abx allergies.  No vaginal symptoms.   Past Medical History:  Diagnosis Date   Allergic rhinitis    Anxiety    Arthritis    HANDS   Benign liver cyst    multiple   Chronic constipation    Depression    Diverticulosis of colon    Emphysematous COPD (HCC)    External hemorrhoid    GERD (gastroesophageal reflux disease)    History of colon polyps    History of Helicobacter pylori infection    History of TIA (transient ischemic attack)    apr 2013-- no residual   Hyperlipidemia    Occipital neuralgia of left side 12/08/2014   Osteoporosis    Pancreatic cyst    simple benign   PONV (postoperative nausea and vomiting)    Prolapsed internal hemorrhoids, grade 3    Simple renal cyst    right    Wears glasses    Wears partial dentures    UPPER    Past Surgical History:  Procedure Laterality Date   APPENDECTOMY  1960   BREAST EXCISIONAL BIOPSY Left    BREAST EXCISIONAL BIOPSY Right    CATARACT EXTRACTION Left    COLONOSCOPY WITH PROPOFOL  last one 10-25-2013   polypectomy   D & C HYSTEROSCOPY W/ ENDOMETRIAL POLYPECTOMY     ESOPHAGOGASTRODUODENOSCOPY  last one 03-04-2013   EXCISION BILATERAL BREAST CYST  1995   HEMORRHOID SURGERY N/A 04/06/2014   Procedure: Hemmorhoidopexy with anal canal biopsy;  Surgeon: Romie Levee, MD;  Location: [facility] [place];  Service: General;  Laterality: N/A;   LAPAROSCOPIC RIGHT OVARIAN CYSTECTOMY/  D & C HYSTEROSCOPY ENDOMETRIAL POLYPECTOMY   2011   TONSILLECTOMY AND ADENOIDECTOMY  1955   TRANSTHORACIC ECHOCARDIOGRAM  08/13/2012   normal LV/  ef 60-65%/  mild AR/  trivial MR    Family History  Problem Relation Age of Onset   Alcoholism Father    Diabetes type II Brother    Heart attack Brother    Cirrhosis Mother        non-alcoholic   Multiple sclerosis Daughter    Pancreatitis Daughter    Colon cancer Neg Hx     Social History   Socioeconomic History   Marital status: Widowed    Spouse name: Channing Mutters   Number of children: 2   Years of education: 12   Highest education level: Not on file  Occupational History   Occupation: retired  Tobacco Use   Smoking status: Former    Packs/day: 1.00    Years: 17.00    Pack years: 17.00    Types: Cigarettes    Quit date: 04/02/1988    Years since quitting: 32.7   Smokeless tobacco: Never  Vaping Use   Vaping Use: Never used  Substance and Sexual Activity   Alcohol use: No   Drug use: No   Sexual activity: Not on file  Other Topics Concern   Not on file  Social History Narrative  Patient lives at home with her grandson.  Husband Channing Mutters) died May 21, 2017 as did her daughter Sharyl Nimrod.  Works for [employer]. Patient has high school education and two children (1 deceased). Caffeine 2-3 cups daily.   Right handed    Social Determinants of Health   Financial Resource Strain: Low Risk    Difficulty of Paying Living Expenses: Not very hard  Food Insecurity: No Food Insecurity   Worried About Running Out of Food in the Last Year: Never true   Ran Out of Food in the Last Year: Never true  Transportation Needs: No Transportation Needs   Lack of Transportation (Medical): No   Lack of Transportation (Non-Medical): No  Physical Activity: Insufficiently Active   Days of Exercise per Week: 7 days   Minutes of Exercise per Session: 10 min  Stress: Stress Concern Present   Feeling of Stress : Rather much  Social Connections: Not on file  Intimate Partner Violence:  Not on file    Outpatient Medications Prior to Visit  Medication Sig Dispense Refill   ALPRAZolam (XANAX) 1 MG tablet TAKE 1/2 TO 1 TABLET BY MOUTH TWO TIMES A DAY TO THREE TIMES A DAY AS NEEDED FOR ANXIETY (Patient taking differently: Take 0.5 mg by mouth at bedtime as needed.) 60 tablet 1   Ascorbic Acid (VITAMIN C PO) Take by mouth daily.     Calcium Carb-Cholecalciferol (CALCIUM 600 + D PO) Take 2 tablets by mouth daily.     diclofenac Sodium (VOLTAREN) 1 % GEL Apply 2 g topically 4 (four) times daily. 100 g 1   dicyclomine (BENTYL) 10 MG capsule Take 10 mg by mouth daily as needed.     Docusate Calcium (STOOL SOFTENER PO) Take by mouth.     DULoxetine (CYMBALTA) 60 MG capsule TAKE ONE CAPSULE BY MOUTH DAILY 90 capsule 3   loratadine (CLARITIN) 10 MG tablet Take 1 tablet (10 mg total) by mouth daily. (Patient taking differently: Take 10 mg by mouth daily as needed.) 30 tablet 11   losartan (COZAAR) 50 MG tablet TAKE 1 TABLET BY MOUTH DAILY 90 tablet 1   Multiple Vitamins-Minerals (MULTIVITAMIN WITH MINERALS) tablet Take 1 tablet by mouth daily.      pantoprazole (PROTONIX) 40 MG tablet Take 1 tablet (40 mg total) by mouth 2 (two) times daily. 180 tablet 0   Probiotic Product (ALIGN PO) Take by mouth.     simvastatin (ZOCOR) 20 MG tablet TAKE ONE TABLET BY MOUTH EVERY NIGHT AT BEDTIME 90 tablet 1   No facility-administered medications prior to visit.    Allergies  Allergen Reactions   Codeine Anaphylaxis   Erythromycin Other (See Comments)    "severe GI upset, also all "mycins" meds    Flagyl [Metronidazole] Other (See Comments)    "flu-like symptoms"    ROS Review of Systems  Constitutional: Negative.   HENT: Negative.    Eyes: Negative.   Respiratory: Negative.    Cardiovascular: Negative.   Gastrointestinal: Negative.   Genitourinary:  Positive for dysuria and hematuria. Negative for decreased urine volume, difficulty urinating, dyspareunia, enuresis, flank pain,  frequency, genital sores, menstrual problem, pelvic pain, urgency, vaginal bleeding, vaginal discharge and vaginal pain.  Musculoskeletal: Negative.   Skin: Negative.   Neurological: Negative.   Psychiatric/Behavioral: Negative.    All other systems reviewed and are negative.    Objective:    Physical Exam Vitals and nursing note reviewed.  Constitutional:      General: She is  not in acute distress.    Appearance: Normal appearance. She is normal weight. She is not ill-appearing, toxic-appearing or diaphoretic.  Cardiovascular:     Rate and Rhythm: Normal rate and regular rhythm.     Heart sounds: Normal heart sounds. No murmur heard.   No friction rub. No gallop.  Pulmonary:     Effort: Pulmonary effort is normal. No respiratory distress.     Breath sounds: Normal breath sounds. No stridor. No wheezing, rhonchi or rales.  Chest:     Chest wall: No tenderness.  Abdominal:     Tenderness: There is no right CVA tenderness or left CVA tenderness.  Skin:    General: Skin is warm and dry.  Neurological:     General: No focal deficit present.     Mental Status: She is alert and oriented to person, place, and time. Mental status is at baseline.  Psychiatric:        Mood and Affect: Mood normal.        Behavior: Behavior normal.        Thought Content: Thought content normal.        Judgment: Judgment normal.    BP 124/68 (BP Location: Left Arm, Patient Position: Sitting, Cuff Size: Normal)   Pulse 68   Temp 98.4 F (36.9 C) (Temporal)   Ht 5\' 2"  (1.575 m)   Wt 119 lb 9.6 oz (54.3 kg)   LMP 06/16/1997   SpO2 100%   BMI 21.88 kg/m  Wt Readings from Last 3 Encounters:  12/21/20 119 lb 9.6 oz (54.3 kg)  12/11/20 118 lb 12.8 oz (53.9 kg)  08/31/20 114 lb 10.2 oz (52 kg)     Health Maintenance Due  Topic Date Due   COVID-19 Vaccine (4 - Booster for Pfizer series) 01/10/2020    There are no preventive care reminders to display for this patient.  Lab Results  Component  Value Date   TSH 1.16 12/11/2020   Lab Results  Component Value Date   WBC 4.1 12/11/2020   HGB 13.7 12/11/2020   HCT 40.1 12/11/2020   MCV 94.2 12/11/2020   PLT 229.0 12/11/2020   Lab Results  Component Value Date   NA 140 12/11/2020   K 4.1 12/11/2020   CO2 31 12/11/2020   GLUCOSE 52 (L) 12/11/2020   BUN 11 12/11/2020   CREATININE 0.91 12/11/2020   BILITOT 0.4 12/11/2020   ALKPHOS 49 12/11/2020   AST 19 12/11/2020   ALT 16 12/11/2020   PROT 6.4 12/11/2020   ALBUMIN 4.4 12/11/2020   CALCIUM 9.6 12/11/2020   GFR 62.64 12/11/2020   Lab Results  Component Value Date   CHOL 208 (H) 12/11/2020   Lab Results  Component Value Date   HDL 88.40 12/11/2020   Lab Results  Component Value Date   LDLCALC 108 (H) 12/11/2020   Lab Results  Component Value Date   TRIG 56.0 12/11/2020   Lab Results  Component Value Date   CHOLHDL 2 12/11/2020   No results found for: HGBA1C    Assessment & Plan:   Problem List Items Addressed This Visit   None Visit Diagnoses     Dysuria    -  Primary   Relevant Medications   sulfamethoxazole-trimethoprim (BACTRIM DS) 800-160 MG tablet   Other Relevant Orders   Urine culture   POCT urinalysis dipstick   Gross hematuria       Relevant Medications   sulfamethoxazole-trimethoprim (BACTRIM DS) 800-160 MG tablet  Other Relevant Orders   Urine culture   POCT urinalysis dipstick       Meds ordered this encounter  Medications   sulfamethoxazole-trimethoprim (BACTRIM DS) 800-160 MG tablet    Sig: Take 1 tablet by mouth 2 (two) times daily.    Dispense:  6 tablet    Refill:  0    Order Specific Question:   Supervising Provider    Answer:   Carlota Raspberry, JEFFREY R [id number]    Follow-up: Return if symptoms worsen or fail to improve.   PLAN Poct urinalysis shows possible UTI. Tx with bactrim po bid x 3 days Culture sent Return if worsening or failing to improve. ER and Urgent Care precautions reviewed Patient encouraged to call  clinic with any questions, comments, or concerns.  Maximiano Coss, NP

## 2020-12-21 NOTE — Patient Instructions (Signed)
Urinary Tract Infection, Adult A urinary tract infection (UTI) is an infection of any part of the urinary tract. The urinary tract includes the kidneys, ureters, bladder, and urethra. These organs make, store, and get rid of urine in the body. An upper UTI affects the ureters and kidneys. A lower UTI affects the bladder and urethra. What are the causes? Most urinary tract infections are caused by bacteria in your genital area around your urethra, where urine leaves your body. These bacteria grow and cause inflammation of your urinary tract. What increases the risk? You are more likely to develop this condition if: You have a urinary catheter that stays in place. You are not able to control when you urinate or have a bowel movement (incontinence). You are female and you: Use a spermicide or diaphragm for birth control. Have low estrogen levels. Are pregnant. You have certain genes that increase your risk. You are sexually active. You take antibiotic medicines. You have a condition that causes your flow of urine to slow down, such as: An enlarged prostate, if you are female. Blockage in your urethra. A kidney stone. A nerve condition that affects your bladder control (neurogenic bladder). Not getting enough to drink, or not urinating often. You have certain medical conditions, such as: Diabetes. A weak disease-fighting system (immunesystem). Sickle cell disease. Gout. Spinal cord injury. What are the signs or symptoms? Symptoms of this condition include: Needing to urinate right away (urgency). Frequent urination. This may include small amounts of urine each time you urinate. Pain or burning with urination. Blood in the urine. Urine that smells bad or unusual. Trouble urinating. Cloudy urine. Vaginal discharge, if you are female. Pain in the abdomen or the lower back. You may also have: Vomiting or a decreased appetite. Confusion. Irritability or tiredness. A fever or  chills. Diarrhea. The first symptom in older adults may be confusion. In some cases, they may not have any symptoms until the infection has worsened. How is this diagnosed? This condition is diagnosed based on your medical history and a physical exam. You may also have other tests, including: Urine tests. Blood tests. Tests for STIs (sexually transmitted infections). If you have had more than one UTI, a cystoscopy or imaging studies may be done to determine the cause of the infections. How is this treated? Treatment for this condition includes: Antibiotic medicine. Over-the-counter medicines to treat discomfort. Drinking enough water to stay hydrated. If you have frequent infections or have other conditions such as a kidney stone, you may need to see a health care provider who specializes in the urinary tract (urologist). In rare cases, urinary tract infections can cause sepsis. Sepsis is a life-threatening condition that occurs when the body responds to an infection. Sepsis is treated in the hospital with IV antibiotics, fluids, and other medicines. Follow these instructions at home: Medicines Take over-the-counter and prescription medicines only as told by your health care provider. If you were prescribed an antibiotic medicine, take it as told by your health care provider. Do not stop using the antibiotic even if you start to feel better. General instructions Make sure you: Empty your bladder often and completely. Do not hold urine for long periods of time. Empty your bladder after sex. Wipe from front to back after urinating or having a bowel movement if you are female. Use each tissue only one time when you wipe. Drink enough fluid to keep your urine pale yellow. Keep all follow-up visits. This is important. Contact a health care provider   if: Your symptoms do not get better after 1-2 days. Your symptoms go away and then return. Get help right away if: You have severe pain in your  back or your lower abdomen. You have a fever or chills. You have nausea or vomiting. Summary A urinary tract infection (UTI) is an infection of any part of the urinary tract, which includes the kidneys, ureters, bladder, and urethra. Most urinary tract infections are caused by bacteria in your genital area. Treatment for this condition often includes antibiotic medicines. If you were prescribed an antibiotic medicine, take it as told by your health care provider. Do not stop using the antibiotic even if you start to feel better. Keep all follow-up visits. This is important. This information is not intended to replace advice given to you by your health care provider. Make sure you discuss any questions you have with your health care provider. Document Revised: 09/02/2019 Document Reviewed: 09/02/2019 Elsevier Patient Education  2022 Elsevier Inc.  

## 2020-12-23 DIAGNOSIS — H2511 Age-related nuclear cataract, right eye: Secondary | ICD-10-CM | POA: Diagnosis not present

## 2020-12-23 LAB — URINE CULTURE
MICRO NUMBER:: 12659458
SPECIMEN QUALITY:: ADEQUATE

## 2020-12-24 ENCOUNTER — Telehealth: Payer: Self-pay | Admitting: Family Medicine

## 2020-12-24 NOTE — Telephone Encounter (Signed)
Noted results on the culture, was hoping she'd see - culture shows susceptibility to E Coli. She should be fine. If symptoms persist she should let me know  Thanks,  Luan Pulling

## 2020-12-24 NOTE — Telephone Encounter (Signed)
Called  patient and she understood and said she would call if she need anything.

## 2020-12-24 NOTE — Telephone Encounter (Signed)
..  Caller name:  Jinelle Butchko  On DPR? :yes/no: Yes  Call back number:[phone]  Provider they see:  Beverely Low - saw Janeece Agee last  Reason for call: Patient  wants to know if she needs any more antibiotic called in since the culture came back.

## 2020-12-24 NOTE — Telephone Encounter (Signed)
Patient states that she received 3 days of Bactrim on 12/21/2020. She is done with the medication but has the results from her culture showing E.Coli and is wonder should she get more antibiotics at this time.

## 2021-01-02 DIAGNOSIS — H2511 Age-related nuclear cataract, right eye: Secondary | ICD-10-CM | POA: Diagnosis not present

## 2021-01-02 DIAGNOSIS — H401111 Primary open-angle glaucoma, right eye, mild stage: Secondary | ICD-10-CM | POA: Diagnosis not present

## 2021-01-09 ENCOUNTER — Telehealth: Payer: Medicare HMO

## 2021-01-22 NOTE — Progress Notes (Signed)
PCP:  Sheliah Hatch, MD Primary Cardiologist: None Electrophysiologist: Lewayne Bunting, MD   Regina Gonzalez is a 75 y.o. female seen today for Lewayne Bunting, MD for routine electrophysiology followup.  Since last being seen in our clinic the patient reports doing very well. She has not had further palpitations. Several times a week her Fitbit registers HR excursions into the 120-140 range. She is never aware of these, if true, only sees them when reviewing her HRs for the day. she denies chest pain, dyspnea, PND, orthopnea, nausea, vomiting, dizziness, syncope, edema, weight gain, or early satiety.  Past Medical History:  Diagnosis Date   Allergic rhinitis    Anxiety    Arthritis    HANDS   Benign liver cyst    multiple   Chronic constipation    Depression    Diverticulosis of colon    Emphysematous COPD (HCC)    External hemorrhoid    GERD (gastroesophageal reflux disease)    History of colon polyps    History of Helicobacter pylori infection    History of TIA (transient ischemic attack)    apr 2013-- no residual   Hyperlipidemia    Occipital neuralgia of left side 12/08/2014   Osteoporosis    Pancreatic cyst    simple benign   PONV (postoperative nausea and vomiting)    Prolapsed internal hemorrhoids, grade 3    Simple renal cyst    right    Wears glasses    Wears partial dentures    UPPER   Past Surgical History:  Procedure Laterality Date   APPENDECTOMY  1960   BREAST EXCISIONAL BIOPSY Left    BREAST EXCISIONAL BIOPSY Right    CATARACT EXTRACTION Left    COLONOSCOPY WITH PROPOFOL  last one 10-25-2013   polypectomy   D & C HYSTEROSCOPY W/ ENDOMETRIAL POLYPECTOMY     ESOPHAGOGASTRODUODENOSCOPY  last one 03-04-2013   EXCISION BILATERAL BREAST CYST  1995   HEMORRHOID SURGERY N/A 04/06/2014   Procedure: Hemmorhoidopexy with anal canal biopsy;  Surgeon: Romie Levee, MD;  Location: [facility] [place];  Service: General;  Laterality: N/A;    LAPAROSCOPIC RIGHT OVARIAN CYSTECTOMY/  D & C HYSTEROSCOPY ENDOMETRIAL POLYPECTOMY  2011   TONSILLECTOMY AND ADENOIDECTOMY  1955   TRANSTHORACIC ECHOCARDIOGRAM  08/13/2012   normal LV/  ef 60-65%/  mild AR/  trivial MR    Current Outpatient Medications  Medication Sig Dispense Refill   ALPRAZolam (XANAX) 1 MG tablet TAKE 1/2 TO 1 TABLET BY MOUTH TWO TIMES A DAY TO THREE TIMES A DAY AS NEEDED FOR ANXIETY (Patient taking differently: Take 0.5 mg by mouth at bedtime as needed.) 60 tablet 1   Ascorbic Acid (VITAMIN C PO) Take by mouth daily.     Calcium Carb-Cholecalciferol (CALCIUM 600 + D PO) Take 2 tablets by mouth daily.     diclofenac Sodium (VOLTAREN) 1 % GEL Apply 2 g topically 4 (four) times daily. 100 g 1   dicyclomine (BENTYL) 10 MG capsule Take 10 mg by mouth daily as needed.     Docusate Calcium (STOOL SOFTENER PO) Take by mouth.     DULoxetine (CYMBALTA) 60 MG capsule TAKE ONE CAPSULE BY MOUTH DAILY 90 capsule 3   loratadine (CLARITIN) 10 MG tablet Take 1 tablet (10 mg total) by mouth daily. (Patient taking differently: Take 10 mg by mouth daily as needed.) 30 tablet 11   losartan (COZAAR) 50 MG tablet TAKE 1 TABLET BY MOUTH DAILY 90 tablet 1  Multiple Vitamins-Minerals (MULTIVITAMIN WITH MINERALS) tablet Take 1 tablet by mouth daily.      pantoprazole (PROTONIX) 40 MG tablet Take 1 tablet (40 mg total) by mouth 2 (two) times daily. 180 tablet 0   Probiotic Product (ALIGN PO) Take by mouth.     simvastatin (ZOCOR) 20 MG tablet TAKE ONE TABLET BY MOUTH EVERY NIGHT AT BEDTIME 90 tablet 1   sulfamethoxazole-trimethoprim (BACTRIM DS) 800-160 MG tablet Take 1 tablet by mouth 2 (two) times daily. 6 tablet 0   No current facility-administered medications for this visit.    Allergies  Allergen Reactions   Codeine Anaphylaxis   Erythromycin Other (See Comments)    "severe GI upset, also all "mycins" meds    Flagyl [Metronidazole] Other (See Comments)    "flu-like symptoms"     Social History   Socioeconomic History   Marital status: Widowed    Spouse name: Carloyn Manner   Number of children: 2   Years of education: 12   Highest education level: Not on file  Occupational History   Occupation: retired  Tobacco Use   Smoking status: Former    Packs/day: 1.00    Years: 17.00    Pack years: 17.00    Types: Cigarettes    Quit date: 04/02/1988    Years since quitting: 32.8   Smokeless tobacco: Never  Vaping Use   Vaping Use: Never used  Substance and Sexual Activity   Alcohol use: No   Drug use: No   Sexual activity: Not on file  Other Topics Concern   Not on file  Social History Narrative   Patient lives at home with her grandson.  Husband Carloyn Manner) died 05-28-17 as did her daughter Ailene Ravel.  Works for [employer]. Patient has high school education and two children (1 deceased). Caffeine 2-3 cups daily.   Right handed    Social Determinants of Health   Financial Resource Strain: Low Risk    Difficulty of Paying Living Expenses: Not very hard  Food Insecurity: No Food Insecurity   Worried About Running Out of Food in the Last Year: Never true   Ran Out of Food in the Last Year: Never true  Transportation Needs: No Transportation Needs   Lack of Transportation (Medical): No   Lack of Transportation (Non-Medical): No  Physical Activity: Insufficiently Active   Days of Exercise per Week: 7 days   Minutes of Exercise per Session: 10 min  Stress: Stress Concern Present   Feeling of Stress : Rather much  Social Connections: Not on file  Intimate Partner Violence: Not on file     Review of Systems: All other systems reviewed and are otherwise negative except as noted above.  Physical Exam: There were no vitals filed for this visit.  GEN- The patient is well appearing, alert and oriented x 3 today.   HEENT: normocephalic, atraumatic; sclera clear, conjunctiva pink; hearing intact; oropharynx clear; neck supple, no JVP Lymph- no cervical  lymphadenopathy Lungs- Clear to ausculation bilaterally, normal work of breathing.  No wheezes, rales, rhonchi Heart- Regular rate and rhythm, no murmurs, rubs or gallops, PMI not laterally displaced GI- soft, non-tender, non-distended, bowel sounds present, no hepatosplenomegaly Extremities- no clubbing, cyanosis, or edema; DP/PT/radial pulses 2+ bilaterally MS- no significant deformity or atrophy Skin- warm and dry, no rash or lesion Psych- euthymic mood, full affect Neuro- strength and sensation are intact  EKG is ordered. Personal review of EKG from today shows NSR at 65 bpm, normal  intervals  Additional studies reviewed include: Previous EP office notes.   Assessment and Plan:  1. Palpitations -  Stable on current regimen Avoid caffeine and ETOH. HR excursions on her fitbit are inconclusive and she currently has no symptoms. Possibly false readings or reading short intervals between ectopy. With no symptoms, no plan to monitor at this time, and stable for years per patient.   2. Non-exertional chest fullness -  Etiology is unclear, suspect reflux and anxiety are contributing. Reviewed alarm symptoms and call back prompts.   Follow up with  EP as needed at patient request.   Shirley Friar, PA-C  01/22/21 10:48 AM

## 2021-01-23 ENCOUNTER — Ambulatory Visit: Payer: Medicare Other | Admitting: Student

## 2021-01-23 ENCOUNTER — Encounter: Payer: Self-pay | Admitting: Student

## 2021-01-23 ENCOUNTER — Other Ambulatory Visit: Payer: Self-pay

## 2021-01-23 VITALS — BP 120/52 | HR 67 | Ht 62.0 in | Wt 119.0 lb

## 2021-01-23 DIAGNOSIS — R002 Palpitations: Secondary | ICD-10-CM

## 2021-01-23 DIAGNOSIS — R0602 Shortness of breath: Secondary | ICD-10-CM

## 2021-01-23 NOTE — Patient Instructions (Signed)
Medication Instructions:  °Your physician recommends that you continue on your current medications as directed. Please refer to the Current Medication list given to you today. ° °*If you need a refill on your cardiac medications before your next appointment, please call your pharmacy* ° ° °Lab Work: °None °If you have labs (blood work) drawn today and your tests are completely normal, you will receive your results only by: °MyChart Message (if you have MyChart) OR °A paper copy in the mail °If you have any lab test that is abnormal or we need to change your treatment, we will call you to review the results. ° ° °Follow-Up: °At [facility], you and your health needs are our priority.  As part of our continuing mission to provide you with exceptional heart care, we have created designated Provider Care Teams.  These Care Teams include your primary Cardiologist (physician) and Advanced Practice Providers (APPs -  Physician Assistants and Nurse Practitioners) who all work together to provide you with the care you need, when you need it. ° ° °Your next appointment:   °As needed °

## 2021-02-13 DIAGNOSIS — H401430 Capsular glaucoma with pseudoexfoliation of lens, bilateral, stage unspecified: Secondary | ICD-10-CM | POA: Diagnosis not present

## 2021-02-18 ENCOUNTER — Other Ambulatory Visit: Payer: Self-pay

## 2021-02-18 MED ORDER — PANTOPRAZOLE SODIUM 40 MG PO TBEC: 40.0000 mg | DELAYED_RELEASE_TABLET | Freq: Two times a day (BID) | ORAL | 0 refills | Status: AC

## 2021-02-22 ENCOUNTER — Other Ambulatory Visit: Payer: Self-pay | Admitting: Family Medicine

## 2021-02-22 MED ORDER — DICYCLOMINE HCL 10 MG PO CAPS: 10.0000 mg | ORAL_CAPSULE | Freq: Every day | ORAL | 0 refills | Status: DC | PRN

## 2021-02-22 NOTE — Telephone Encounter (Signed)
Pt is requesting refill dicyclomine I do not see previous Rx in your name, last ov is 12/21/20   Please advise

## 2021-02-22 NOTE — Telephone Encounter (Signed)
Pt called in asking for a new script of the Dicyclomine to be sent to the [facility] on [address]   Please advise

## 2021-03-04 DIAGNOSIS — R140 Abdominal distension (gaseous): Secondary | ICD-10-CM | POA: Diagnosis not present

## 2021-03-04 DIAGNOSIS — K862 Cyst of pancreas: Secondary | ICD-10-CM | POA: Diagnosis not present

## 2021-03-04 DIAGNOSIS — R1011 Right upper quadrant pain: Secondary | ICD-10-CM | POA: Diagnosis not present

## 2021-03-04 DIAGNOSIS — K219 Gastro-esophageal reflux disease without esophagitis: Secondary | ICD-10-CM | POA: Diagnosis not present

## 2021-03-04 DIAGNOSIS — K581 Irritable bowel syndrome with constipation: Secondary | ICD-10-CM | POA: Diagnosis not present

## 2021-03-04 DIAGNOSIS — R1031 Right lower quadrant pain: Secondary | ICD-10-CM | POA: Diagnosis not present

## 2021-03-04 DIAGNOSIS — Q446 Cystic disease of liver: Secondary | ICD-10-CM | POA: Diagnosis not present

## 2021-03-05 ENCOUNTER — Other Ambulatory Visit: Payer: Self-pay

## 2021-03-05 MED ORDER — ALPRAZOLAM 1 MG PO TABS: ORAL_TABLET | ORAL | 1 refills | Status: DC

## 2021-03-05 MED ORDER — LOSARTAN POTASSIUM 50 MG PO TABS: 50.0000 mg | ORAL_TABLET | Freq: Every day | ORAL | 1 refills | Status: DC

## 2021-03-08 ENCOUNTER — Other Ambulatory Visit: Payer: Self-pay | Admitting: Physician Assistant

## 2021-03-08 DIAGNOSIS — Q613 Polycystic kidney, unspecified: Secondary | ICD-10-CM

## 2021-03-08 DIAGNOSIS — K862 Cyst of pancreas: Secondary | ICD-10-CM

## 2021-03-08 DIAGNOSIS — Q446 Cystic disease of liver: Secondary | ICD-10-CM

## 2021-03-08 DIAGNOSIS — R1011 Right upper quadrant pain: Secondary | ICD-10-CM

## 2021-03-08 DIAGNOSIS — R1031 Right lower quadrant pain: Secondary | ICD-10-CM

## 2021-03-25 ENCOUNTER — Other Ambulatory Visit: Payer: Medicare Other

## 2021-03-26 ENCOUNTER — Ambulatory Visit
Admission: RE | Admit: 2021-03-26 | Discharge: 2021-03-26 | Disposition: A | Discharge status: Home / Self Care | Payer: Medicare Other | Source: Ambulatory Visit | Attending: Physician Assistant | Admitting: Physician Assistant

## 2021-03-26 DIAGNOSIS — Q613 Polycystic kidney, unspecified: Secondary | ICD-10-CM

## 2021-03-26 DIAGNOSIS — K862 Cyst of pancreas: Secondary | ICD-10-CM

## 2021-03-26 DIAGNOSIS — R1011 Right upper quadrant pain: Secondary | ICD-10-CM

## 2021-03-26 DIAGNOSIS — Q446 Cystic disease of liver: Secondary | ICD-10-CM

## 2021-03-26 DIAGNOSIS — R1031 Right lower quadrant pain: Secondary | ICD-10-CM

## 2021-03-26 MED ORDER — GADOBENATE DIMEGLUMINE 529 MG/ML IV SOLN
10.0000 mL | Freq: Once | INTRAVENOUS | Status: AC | PRN
  Administered 2021-03-26: 10 mL via INTRAVENOUS

## 2021-04-02 ENCOUNTER — Other Ambulatory Visit: Payer: Self-pay | Admitting: Family Medicine

## 2021-04-02 DIAGNOSIS — Z1231 Encounter for screening mammogram for malignant neoplasm of breast: Secondary | ICD-10-CM

## 2021-04-25 ENCOUNTER — Encounter: Payer: Self-pay | Admitting: Registered Nurse

## 2021-04-25 ENCOUNTER — Other Ambulatory Visit: Payer: Self-pay

## 2021-04-25 ENCOUNTER — Ambulatory Visit (INDEPENDENT_AMBULATORY_CARE_PROVIDER_SITE_OTHER): Payer: Medicare Other | Admitting: Registered Nurse

## 2021-04-25 VITALS — BP 118/50 | HR 79 | Temp 98.2°F | Resp 16 | Ht 62.0 in | Wt 120.0 lb

## 2021-04-25 DIAGNOSIS — R051 Acute cough: Secondary | ICD-10-CM

## 2021-04-25 MED ORDER — PREDNISONE 20 MG PO TABS: 20.0000 mg | ORAL_TABLET | Freq: Every day | ORAL | 0 refills | Status: DC

## 2021-04-25 NOTE — Patient Instructions (Signed)
Regina Gonzalez -  ? ?Great to see you ? ?No concerns for pneumonia. Can use prednisone for cough if needed ? ?Call if worsening or failing to improve ? ?Thank you ? ?[name]  ?

## 2021-04-25 NOTE — Progress Notes (Signed)
? ?Acute Office Visit ? ?Subjective:  ? ? Patient ID: Regina Gonzalez, female    DOB: Sep 02, 1945, 76 y.o.   MRN: [id number] ? ?Chief Complaint  ?Patient presents with  ? Nasal Congestion  ?  Has had this for 5 days, this is casing her to not be  able to sleep ?Feels like there is stuff in her throat    ? ? ?HPI ?Patient is in today for nasal congestion ? ?Ongoing 5-6 days ?Pnd, cough, itching throat. ?Burning in throat. ?Overall feeling better, but not entirely resolved. ?Fears pna, has had in the remote past. ?Did recently finish course of abx for unrelated reason ? ?Outpatient Medications Prior to Visit  ?Medication Sig Dispense Refill  ? ALPRAZolam (XANAX) 1 MG tablet TAKE 1/2 TO 1 TABLET BY MOUTH TWO TIMES A DAY TO THREE TIMES A DAY AS NEEDED FOR ANXIETY 60 tablet 1  ? Ascorbic Acid (VITAMIN C PO) Take by mouth daily.    ? Calcium Carb-Cholecalciferol (CALCIUM 600 + D PO) Take 2 tablets by mouth daily.    ? diclofenac Sodium (VOLTAREN) 1 % GEL Apply 2 g topically 4 (four) times daily. 100 g 1  ? dicyclomine (BENTYL) 10 MG capsule Take 1 capsule (10 mg total) by mouth daily as needed. 30 capsule 0  ? Docusate Calcium (STOOL SOFTENER PO) Take by mouth as needed.    ? DULoxetine (CYMBALTA) 60 MG capsule TAKE ONE CAPSULE BY MOUTH DAILY 90 capsule 3  ? loratadine (CLARITIN) 10 MG tablet Take 1 tablet (10 mg total) by mouth daily. (Patient taking differently: Take 10 mg by mouth daily as needed.) 30 tablet 11  ? losartan (COZAAR) 50 MG tablet Take 1 tablet (50 mg total) by mouth daily. 90 tablet 1  ? Multiple Vitamins-Minerals (MULTIVITAMIN WITH MINERALS) tablet Take 1 tablet by mouth daily.     ? pantoprazole (PROTONIX) 40 MG tablet Take 1 tablet (40 mg total) by mouth 2 (two) times daily. 180 tablet 0  ? simvastatin (ZOCOR) 20 MG tablet TAKE ONE TABLET BY MOUTH EVERY NIGHT AT BEDTIME 90 tablet 1  ? Probiotic Product (ALIGN PO) Take by mouth daily.    ? ?No facility-administered medications prior to visit.   ? ? ?Review of Systems  ?Constitutional: Negative.   ?HENT: Negative.    ?Eyes: Negative.   ?Respiratory:  Positive for cough.   ?Cardiovascular: Negative.   ?Gastrointestinal: Negative.   ?Genitourinary: Negative.   ?Musculoskeletal: Negative.   ?Skin: Negative.   ?Neurological: Negative.   ?Psychiatric/Behavioral: Negative.    ?All other systems reviewed and are negative. ? ?   ?Objective:  ?  ?BP (!) 118/50   Pulse 79   Temp 98.2 ?F (36.8 ?C) (Temporal)   Resp 16   Ht 5\' 2"  (1.575 m)   Wt 120 lb (54.4 kg)   LMP 06/16/1997   SpO2 98%   BMI 21.95 kg/m?  ?Physical Exam ?Vitals and nursing note reviewed.  ?Constitutional:   ?   General: She is not in acute distress. ?   Appearance: Normal appearance. She is normal weight. She is not ill-appearing, toxic-appearing or diaphoretic.  ?Cardiovascular:  ?   Rate and Rhythm: Normal rate and regular rhythm.  ?   Heart sounds: Normal heart sounds. No murmur heard. ?  No friction rub. No gallop.  ?Pulmonary:  ?   Effort: Pulmonary effort is normal. No respiratory distress.  ?   Breath sounds: Normal breath sounds. No stridor. No wheezing, rhonchi or  rales.  ?Chest:  ?   Chest wall: No tenderness.  ?Skin: ?   General: Skin is warm and dry.  ?Neurological:  ?   General: No focal deficit present.  ?   Mental Status: She is alert and oriented to person, place, and time. Mental status is at baseline.  ?Psychiatric:     ?   Mood and Affect: Mood normal.     ?   Behavior: Behavior normal.     ?   Thought Content: Thought content normal.     ?   Judgment: Judgment normal.  ? ? ?No results found for any visits on 04/25/21. ? ? ?   ?Assessment & Plan:  ?1. Acute cough ?- predniSONE (DELTASONE) 20 MG tablet; Take 1 tablet (20 mg total) by mouth daily with breakfast.  Dispense: 5 tablet; Refill: 0 ? ? ? ?Meds ordered this encounter  ?Medications  ? predniSONE (DELTASONE) 20 MG tablet  ?  Sig: Take 1 tablet (20 mg total) by mouth daily with breakfast.  ?  Dispense:  5 tablet  ?   Refill:  0  ?  Order Specific Question:   Supervising Provider  ?  Answer:   Carlota Raspberry, JEFFREY R [[id number]]  ? ? ?Return if symptoms worsen or fail to improve. ? ?PLAN ?Exam reassuring ?Prednisone for acute cough ?Ok to continue OTCs. ?Return if worsening or failing to improve ?Patient encouraged to call clinic with any questions, comments, or concerns. ? ?Maximiano Coss, NP ?

## 2021-05-03 ENCOUNTER — Other Ambulatory Visit: Payer: Self-pay

## 2021-05-03 ENCOUNTER — Telehealth: Payer: Self-pay

## 2021-05-03 MED ORDER — BENZONATATE 100 MG PO CAPS: 100.0000 mg | ORAL_CAPSULE | Freq: Three times a day (TID) | ORAL | 0 refills | Status: DC | PRN

## 2021-05-03 NOTE — Telephone Encounter (Signed)
Patient called in stating that she has finished prednisone, and feels better but still has a bad cough. Zuma says she is wanting to know if there is anything she can take or be prescribed to help it.  ?

## 2021-05-03 NOTE — Telephone Encounter (Signed)
Rx sent per pt request 

## 2021-05-03 NOTE — Telephone Encounter (Signed)
Post infectious cough may last for a few days after finishing treatment. Reasonable to continue OTC cough suppressant or we can sent benzonatate 100mg  po tid prn disp 20 with 0 refills if she'd like ? ?Thanks, ? ?Rich

## 2021-05-29 ENCOUNTER — Telehealth: Payer: Self-pay | Admitting: Family Medicine

## 2021-05-29 NOTE — Telephone Encounter (Signed)
Left message for patient to call back and schedule Medicare Annual Wellness Visit (AWV).   Please offer to do virtually or by telephone.  Left office number and my jabber #[phone].  Last AWV:11/14/2019  Please schedule at anytime with Nurse Health Advisor.   

## 2021-06-11 ENCOUNTER — Encounter: Payer: Self-pay | Admitting: Family Medicine

## 2021-06-11 ENCOUNTER — Ambulatory Visit (INDEPENDENT_AMBULATORY_CARE_PROVIDER_SITE_OTHER): Payer: Medicare Other | Admitting: Family Medicine

## 2021-06-11 VITALS — BP 118/60 | HR 82 | Temp 98.1°F | Resp 16 | Ht 61.0 in | Wt 118.4 lb

## 2021-06-11 DIAGNOSIS — M810 Age-related osteoporosis without current pathological fracture: Secondary | ICD-10-CM | POA: Diagnosis not present

## 2021-06-11 DIAGNOSIS — I10 Essential (primary) hypertension: Secondary | ICD-10-CM | POA: Diagnosis not present

## 2021-06-11 DIAGNOSIS — F331 Major depressive disorder, recurrent, moderate: Secondary | ICD-10-CM | POA: Diagnosis not present

## 2021-06-11 DIAGNOSIS — Z0000 Encounter for general adult medical examination without abnormal findings: Secondary | ICD-10-CM | POA: Diagnosis not present

## 2021-06-11 DIAGNOSIS — R413 Other amnesia: Secondary | ICD-10-CM | POA: Diagnosis not present

## 2021-06-11 LAB — HEPATIC FUNCTION PANEL
ALT: 18 U/L (ref 0–35)
AST: 21 U/L (ref 0–37)
Albumin: 4.3 g/dL (ref 3.5–5.2)
Alkaline Phosphatase: 61 U/L (ref 39–117)
Bilirubin, Direct: 0.1 mg/dL (ref 0.0–0.3)
Total Bilirubin: 0.5 mg/dL (ref 0.2–1.2)
Total Protein: 6.4 g/dL (ref 6.0–8.3)

## 2021-06-11 LAB — BASIC METABOLIC PANEL WITH GFR
BUN: 10 mg/dL (ref 6–23)
CO2: 32 meq/L (ref 19–32)
Calcium: 9.4 mg/dL (ref 8.4–10.5)
Chloride: 103 meq/L (ref 96–112)
Creatinine, Ser: 0.97 mg/dL (ref 0.40–1.20)
GFR: 57.81 mL/min — ABNORMAL LOW
Glucose, Bld: 65 mg/dL — ABNORMAL LOW (ref 70–99)
Potassium: 3.8 meq/L (ref 3.5–5.1)
Sodium: 140 meq/L (ref 135–145)

## 2021-06-11 LAB — CBC WITH DIFFERENTIAL/PLATELET
Basophils Absolute: 0.1 10*3/uL (ref 0.0–0.1)
Basophils Relative: 1.3 % (ref 0.0–3.0)
Eosinophils Absolute: 0.2 10*3/uL (ref 0.0–0.7)
Eosinophils Relative: 4.9 % (ref 0.0–5.0)
HCT: 37.7 % (ref 36.0–46.0)
Hemoglobin: 12.9 g/dL (ref 12.0–15.0)
Lymphocytes Relative: 42.6 % (ref 12.0–46.0)
Lymphs Abs: 1.9 10*3/uL (ref 0.7–4.0)
MCHC: 34.1 g/dL (ref 30.0–36.0)
MCV: 92.8 fl (ref 78.0–100.0)
Monocytes Absolute: 0.4 10*3/uL (ref 0.1–1.0)
Monocytes Relative: 8.4 % (ref 3.0–12.0)
Neutro Abs: 1.9 10*3/uL (ref 1.4–7.7)
Neutrophils Relative %: 42.8 % — ABNORMAL LOW (ref 43.0–77.0)
Platelets: 249 10*3/uL (ref 150.0–400.0)
RBC: 4.07 Mil/uL (ref 3.87–5.11)
RDW: 13.9 % (ref 11.5–15.5)
WBC: 4.5 10*3/uL (ref 4.0–10.5)

## 2021-06-11 LAB — VITAMIN D 25 HYDROXY (VIT D DEFICIENCY, FRACTURES): VITD: 53.74 ng/mL (ref 30.00–100.00)

## 2021-06-11 LAB — LIPID PANEL
Cholesterol: 191 mg/dL (ref 0–200)
HDL: 82.9 mg/dL
LDL Cholesterol: 92 mg/dL (ref 0–99)
NonHDL: 108.46
Total CHOL/HDL Ratio: 2
Triglycerides: 80 mg/dL (ref 0.0–149.0)
VLDL: 16 mg/dL (ref 0.0–40.0)

## 2021-06-11 LAB — TSH: TSH: 1.51 u[IU]/mL (ref 0.35–5.50)

## 2021-06-11 MED ORDER — TRAZODONE HCL 50 MG PO TABS: 25.0000 mg | ORAL_TABLET | Freq: Every evening | ORAL | 3 refills | Status: DC | PRN

## 2021-06-11 NOTE — Patient Instructions (Addendum)
Follow up in 6 months to recheck BP and cholesterol ?We'll notify you of your lab results and make any changes if needed ?START the Trazodone nightly.  Take a 1/2 tab and only increase to a whole tab if needed ?We'll call you with your Neuropsych referral ?Keep up the good work on healthy diet and regular exercise- you look great! ?We'll call you to schedule your bone density test ?Call with any questions or concerns ?Stay Safe!  Stay Healthy! ?CONGRATS on the new guy!!! ?

## 2021-06-11 NOTE — Progress Notes (Signed)
Subjective:    Patient ID: Regina Gonzalez, female    DOB: Jun 12, 1945, 76 y.o.   MRN: [id number]  HPI CPE- UTD on colonoscopy, Tdap, PNA, flu.  Due for repeat mammo later this month- scheduled.  Patient Care Team    Relationship Specialty Notifications Start End  Sheliah Hatch, MD PCP - General   01/09/10    Comment: Lilia Argue, Doylene Canning, MD PCP - Electrophysiology Cardiology  01/22/21   Willis Modena, MD Consulting Physician Gastroenterology  09/15/14   Romie Levee, MD Consulting Physician General Surgery  09/15/14   Waldon Merl, PA-C Physician Assistant Family Medicine  11/04/18   Celso Amy, PA-C Physician Assistant Physician Assistant  05/09/19   Yetta Glassman, RN Case Manager   08/13/20    Comment: [phone]     Health Maintenance  Topic Date Due  . MAMMOGRAM  06/18/2021  . INFLUENZA VACCINE  09/03/2021  . COLONOSCOPY (Pts 45-78yrs Insurance coverage will need to be confirmed)  06/22/2029  . TETANUS/TDAP  12/23/2029  . Pneumonia Vaccine 35+ Years old  Completed  . DEXA SCAN  Completed  . Hepatitis C Screening  Completed  . Zoster Vaccines- Shingrix  Completed  . HPV VACCINES  Aged Out  . COVID-19 Vaccine  Discontinued      Review of Systems Patient reports no vision/ hearing changes, adenopathy,fever, weight change,  persistant/recurrent hoarseness , swallowing issues, chest pain, palpitations, edema, persistant/recurrent cough, hemoptysis, dyspnea (rest/exertional/paroxysmal nocturnal), gastrointestinal bleeding (melena, rectal bleeding), abdominal pain, significant heartburn, bowel changes, GU symptoms (dysuria, hematuria, incontinence), Gyn symptoms (abnormal  bleeding, pain),  syncope, focal weakness, numbness & tingling, skin/hair/nail changes, abnormal bruising or bleeding.   + memory loss- pt has been very tearful recently and knows that her memory issues may be stress related.  Pt reports feeling scattered and forgetful.  +  Depression- chronic problem, currently on Cymbalta 60mg  daily, Alprazolam prn.  Pt reports poor sleep, feeling very tearful and overwhelmed.  Did meet 'a wonderful man'.    Objective:   Physical Exam General Appearance:    Alert, cooperative, no distress, appears stated age  Head:    Normocephalic, without obvious abnormality, atraumatic  Eyes:    PERRL, conjunctiva/corneas clear, EOM's intact both eyes  Ears:    Normal TM's and external ear canals, both ears  Nose:   Nares normal, septum midline, mucosa normal, no drainage    or sinus tenderness  Throat:   Lips, mucosa, and tongue normal; teeth and gums normal  Neck:   Supple, symmetrical, trachea midline, no adenopathy;    Thyroid: no enlargement/tenderness/nodules  Back:     Symmetric, no curvature, ROM normal, no CVA tenderness  Lungs:     Clear to auscultation bilaterally, respirations unlabored  Chest Wall:    No tenderness or deformity   Heart:    Regular rate and rhythm, S1 and S2 normal, no murmur, rub   or gallop  Breast Exam:    Deferred to mammo  Abdomen:     Soft, non-tender, bowel sounds active all four quadrants,    no masses, no organomegaly  Genitalia:    Deferred   Rectal:    Extremities:   Extremities normal, atraumatic, no cyanosis or edema  Pulses:   2+ and symmetric all extremities  Skin:   Skin color, texture, turgor normal, no rashes or lesions  Lymph nodes:   Cervical, supraclavicular, and axillary nodes normal  Neurologic:   CNII-XII intact, normal strength, sensation and  reflexes    throughout          Assessment & Plan:

## 2021-06-12 ENCOUNTER — Telehealth: Payer: Self-pay

## 2021-06-12 NOTE — Telephone Encounter (Signed)
Spoke w/ pt and advised of lab results  

## 2021-06-12 NOTE — Telephone Encounter (Signed)
-----   Message from Midge Minium, MD sent at 06/12/2021  7:14 AM EDT ----- ?Labs look great!  No changes at this time ?

## 2021-06-14 ENCOUNTER — Other Ambulatory Visit: Payer: Self-pay

## 2021-06-14 DIAGNOSIS — E7800 Pure hypercholesterolemia, unspecified: Secondary | ICD-10-CM

## 2021-06-14 MED ORDER — SIMVASTATIN 20 MG PO TABS
20.0000 mg | ORAL_TABLET | Freq: Every day | ORAL | 3 refills | Status: DC
Start: 2021-06-14 — End: 2021-09-12

## 2021-06-17 DIAGNOSIS — M79645 Pain in left finger(s): Secondary | ICD-10-CM | POA: Diagnosis not present

## 2021-06-19 ENCOUNTER — Ambulatory Visit
Admission: RE | Admit: 2021-06-19 | Discharge: 2021-06-19 | Disposition: A | Discharge status: Home / Self Care | Payer: Medicare Other | Source: Ambulatory Visit | Attending: Family Medicine | Admitting: Family Medicine

## 2021-06-19 DIAGNOSIS — M25562 Pain in left knee: Secondary | ICD-10-CM | POA: Diagnosis not present

## 2021-06-19 DIAGNOSIS — Z1231 Encounter for screening mammogram for malignant neoplasm of breast: Secondary | ICD-10-CM | POA: Diagnosis not present

## 2021-06-20 ENCOUNTER — Other Ambulatory Visit (HOSPITAL_BASED_OUTPATIENT_CLINIC_OR_DEPARTMENT_OTHER): Payer: Medicare Other

## 2021-06-25 DIAGNOSIS — M79645 Pain in left finger(s): Secondary | ICD-10-CM | POA: Diagnosis not present

## 2021-06-27 ENCOUNTER — Other Ambulatory Visit: Payer: Self-pay

## 2021-06-27 DIAGNOSIS — E7800 Pure hypercholesterolemia, unspecified: Secondary | ICD-10-CM

## 2021-06-27 MED ORDER — SIMVASTATIN 20 MG PO TABS
20.0000 mg | ORAL_TABLET | Freq: Every day | ORAL | 1 refills | Status: DC
Start: 2021-06-27 — End: 2022-06-13

## 2021-06-28 DIAGNOSIS — M25562 Pain in left knee: Secondary | ICD-10-CM | POA: Diagnosis not present

## 2021-07-02 ENCOUNTER — Ambulatory Visit (HOSPITAL_BASED_OUTPATIENT_CLINIC_OR_DEPARTMENT_OTHER)
Admission: RE | Admit: 2021-07-02 | Discharge: 2021-07-02 | Disposition: A | Discharge status: Home / Self Care | Payer: Medicare Other | Source: Ambulatory Visit | Attending: Family Medicine | Admitting: Family Medicine

## 2021-07-02 ENCOUNTER — Telehealth: Payer: Self-pay | Admitting: Family Medicine

## 2021-07-02 DIAGNOSIS — M810 Age-related osteoporosis without current pathological fracture: Secondary | ICD-10-CM | POA: Diagnosis not present

## 2021-07-02 DIAGNOSIS — Z780 Asymptomatic menopausal state: Secondary | ICD-10-CM | POA: Diagnosis not present

## 2021-07-02 NOTE — Telephone Encounter (Signed)
PT stating she need someone to go over test her results for a better understanding.

## 2021-07-02 NOTE — Telephone Encounter (Signed)
Pt is asking for DEXA results explained We can call back pt with comment

## 2021-07-03 NOTE — Assessment & Plan Note (Signed)
Pt has reported this in the past- particularly in times of stress.  She is aware that her sxs may be stress/depression related but is fearful there is an underlying process.  Will refer to Neuropsych for complete evaluation.  Pt expressed understanding and is in agreement w/ plan.

## 2021-07-03 NOTE — Assessment & Plan Note (Signed)
Chronic problem.  Currently well controlled.  Asymptomatic.  Check labs.  No anticipated med changes. 

## 2021-07-03 NOTE — Telephone Encounter (Signed)
She called asking for an explanation before I even had a chance to review it.  Will attach explanation to her imaging results as a result note

## 2021-07-03 NOTE — Assessment & Plan Note (Signed)
Check Vit D level and replete prn. 

## 2021-07-03 NOTE — Assessment & Plan Note (Signed)
Ongoing issue for pt.  Currently on Cymbalta 60mg  daily and Alprazolam prn.  Lately has been feeling very tearful and overwhelmed and struggling w/ poor sleep.  Will start Trazodone in the hopes that better sleep will improve overall mood.  Pt expressed understanding and is in agreement w/ plan.

## 2021-07-03 NOTE — Assessment & Plan Note (Signed)
Pt's PE WNL.  UTD on colonoscopy, Tdap, PNA, flu.  mammo scheduled.  Check labs.  Anticipatory guidance provided.

## 2021-07-04 ENCOUNTER — Encounter: Payer: Self-pay | Admitting: Family Medicine

## 2021-07-05 DIAGNOSIS — M25562 Pain in left knee: Secondary | ICD-10-CM | POA: Diagnosis not present

## 2021-07-10 DIAGNOSIS — M25562 Pain in left knee: Secondary | ICD-10-CM | POA: Diagnosis not present

## 2021-07-16 DIAGNOSIS — M25562 Pain in left knee: Secondary | ICD-10-CM | POA: Diagnosis not present

## 2021-07-22 ENCOUNTER — Telehealth: Payer: Self-pay | Admitting: Internal Medicine

## 2021-07-22 NOTE — Telephone Encounter (Signed)
Agree with heart monitor

## 2021-07-22 NOTE — Telephone Encounter (Signed)
Pt states that over the weekend her Fit Bit was telling her that her heart may be put of rhythm. Pt would like to speak with a nurse. Please advise

## 2021-07-22 NOTE — Telephone Encounter (Signed)
Spoke with the patient who states that on Saturday she was having some back pains and just wasn't feeling great. Her Kardia mobile showed possible Afib on two separate occasions. She did have some dizziness Saturday but no other symptoms. She reports that her HR was normal during the episodes but does not recall exactly what it was. She looked back at her HR readings from her Fit Bit during the day on Saturday and only elevated reading was 120 at 8am however this was not during the episodes of possible Afib. She has been feeling good ever since and Kardia mobile readings have been normal. Her heart rate does continue to spike sporadically but she is asymptomatic and is only aware of it due to her fitbit. Will forward to Dr. Ladona Ridgel for recommendations on evaluation and possible need for heart monitor.

## 2021-07-24 ENCOUNTER — Ambulatory Visit: Payer: Self-pay

## 2021-07-24 DIAGNOSIS — I10 Essential (primary) hypertension: Secondary | ICD-10-CM

## 2021-07-24 NOTE — Patient Instructions (Signed)
Visit Information  Case closed unable to maintain contact  Dudley Major RN, [facility], CDE Care Management Coordinator [facility] [phone]

## 2021-07-24 NOTE — Chronic Care Management (AMB) (Signed)
  Care Management   Follow Up Note   07/24/2021 Name: Regina Gonzalez MRN: [id number] DOB: [birth year]   Referred by: Sheliah Hatch, MD Reason for referral : Chronic Care Management (Case closed unable to maintain contact)   Case closed unable to maintain contact  Follow Up Plan: No further follow up required: Case closed unable to maintain contact Dudley Major RN, [facility], CDE Care Management Coordinator [facility] [phone]

## 2021-07-24 NOTE — Telephone Encounter (Signed)
Returned call to Pt.  She has not had any further episodes of palpitations.  She has a Optician, dispensing mobile at home that caught the potential afib episodes, but unsure how to send those strips.  After further discussion-she will continue to monitor and if she has any further palpitations she will use her Kardia mobile and send those strips to this nurse via MyChart.  Sent instructions on how to send strips.

## 2021-08-05 DIAGNOSIS — M25562 Pain in left knee: Secondary | ICD-10-CM | POA: Diagnosis not present

## 2021-08-30 ENCOUNTER — Telehealth: Payer: Self-pay

## 2021-08-30 ENCOUNTER — Other Ambulatory Visit: Payer: Self-pay

## 2021-08-30 DIAGNOSIS — I10 Essential (primary) hypertension: Secondary | ICD-10-CM

## 2021-08-30 MED ORDER — LOSARTAN POTASSIUM 50 MG PO TABS: 50.0000 mg | ORAL_TABLET | Freq: Every day | ORAL | 1 refills | Status: DC

## 2021-09-01 MED ORDER — ALPRAZOLAM 1 MG PO TABS: ORAL_TABLET | ORAL | 1 refills | Status: DC

## 2021-09-01 NOTE — Telephone Encounter (Signed)
Prescription sent

## 2021-09-10 ENCOUNTER — Telehealth: Payer: Self-pay | Admitting: Family Medicine

## 2021-09-10 NOTE — Telephone Encounter (Signed)
Left message for patient to call back and schedule Medicare Annual Wellness Visit (AWV).   Please offer to do virtually or by telephone.  Left office number and my jabber [phone].  Last AWV:11/14/2019  Please schedule at anytime with Nurse Health Advisor.

## 2021-09-12 ENCOUNTER — Encounter: Payer: Self-pay | Admitting: Family

## 2021-09-12 ENCOUNTER — Ambulatory Visit (INDEPENDENT_AMBULATORY_CARE_PROVIDER_SITE_OTHER): Payer: Medicare Other | Admitting: Family

## 2021-09-12 VITALS — BP 120/54 | HR 67 | Temp 98.1°F | Ht 61.0 in | Wt 118.4 lb

## 2021-09-12 DIAGNOSIS — R5383 Other fatigue: Secondary | ICD-10-CM

## 2021-09-12 LAB — CBC WITH DIFFERENTIAL/PLATELET
Basophils Absolute: 0.1 10*3/uL (ref 0.0–0.1)
Basophils Relative: 1.5 % (ref 0.0–3.0)
Eosinophils Absolute: 0.3 10*3/uL (ref 0.0–0.7)
Eosinophils Relative: 8.8 % — ABNORMAL HIGH (ref 0.0–5.0)
HCT: 36.5 % (ref 36.0–46.0)
Hemoglobin: 12.2 g/dL (ref 12.0–15.0)
Lymphocytes Relative: 37.7 % (ref 12.0–46.0)
Lymphs Abs: 1.4 10*3/uL (ref 0.7–4.0)
MCHC: 33.6 g/dL (ref 30.0–36.0)
MCV: 90.9 fl (ref 78.0–100.0)
Monocytes Absolute: 0.4 10*3/uL (ref 0.1–1.0)
Monocytes Relative: 11.6 % (ref 3.0–12.0)
Neutro Abs: 1.5 10*3/uL (ref 1.4–7.7)
Neutrophils Relative %: 40.4 % — ABNORMAL LOW (ref 43.0–77.0)
Platelets: 240 10*3/uL (ref 150.0–400.0)
RBC: 4.01 Mil/uL (ref 3.87–5.11)
RDW: 13.8 % (ref 11.5–15.5)
WBC: 3.7 10*3/uL — ABNORMAL LOW (ref 4.0–10.5)

## 2021-09-12 NOTE — Patient Instructions (Signed)
It was very nice to see you today!   I will review your lab results via MyChart in a few days.  I have ordered a CT scan for lung cancer screening. The radiology office will call you directly to schedule.  Try a Vitamin B complex supplement which contains all of the B vitamins that are our "energy" vitamins.  Increase your cardio exercise where able - up to 20 minutes getting your hear rate up to 110-120 beats per minute. Exercise helps fight fatigue and strengthens the heart muscle.     PLEASE NOTE:  If you had any lab tests please let us know if you have not heard back within a few days. You may see your results on MyChart before we have a chance to review them but we will give you a call once they are reviewed by [place]. If we ordered any referrals today, please let us know if you have not heard from their office within the next week.

## 2021-09-12 NOTE — Progress Notes (Signed)
Patient ID: Regina Gonzalez, female    DOB: [birth year], 76 y.o.   MRN: [id number]  Chief Complaint  Patient presents with   Fatigue    Pt c/o fatigue for a couple of weeks. Pt states she is tired usually early in the day and feels like she needs a nap. Pt states she does get enough sleep at night. Pt would like labs. BP has been low lately at home 90/63's.    HPI: Fatigue:  reports starting a couple of weeks ago, with some SOB, reports anemia in past, last CBC normal in May, taking Vitamin D supplement daily. She is not exercising, but is active raising her 18yo grandson,  volunteering or out & about most days. She is an ex-smoker, smoked for 26yrs-  1ppd, quit in 1990, low dose CT scan for lung ca screening not recommended for her. Denies any daily cough, wheezing, chest pain, or tightness.   Assessment & Plan:  1. Fatigue, unspecified type starting a couple of weeks ago, denies any other sx other than some SOB w/exertion. She states she has these sx when she has been anemic in the past, just started taking her iron pill again 2 days ago. She is wanting the lung cancer screening CT scan also, wondering if her SOB is r/t her hx of smoking. But d/t her <19yr hx, it is not recommended, and she is only having mild SOB, no daily cough, wheezing, etc. Advised pt that if her lung sx worsen, to discuss with her PCP & maybe a CXR would be warranted at that time & ease her mind.  - CBC with Differential/Platelet   Subjective:    Outpatient Medications Prior to Visit  Medication Sig Dispense Refill   ALPRAZolam (XANAX) 1 MG tablet TAKE 1/2 TO 1 TABLET BY MOUTH TWO TIMES A DAY TO THREE TIMES A DAY AS NEEDED FOR ANXIETY 60 tablet 1   Ascorbic Acid (VITAMIN C PO) Take by mouth daily.     Calcium Carb-Cholecalciferol (CALCIUM 600 + D PO) Take 2 tablets by mouth daily.     diclofenac Sodium (VOLTAREN) 1 % GEL Apply 2 g topically 4 (four) times daily. 100 g 1   dicyclomine (BENTYL) 10 MG  capsule Take 1 capsule (10 mg total) by mouth daily as needed. 30 capsule 0   Docusate Calcium (STOOL SOFTENER PO) Take by mouth as needed.     DULoxetine (CYMBALTA) 60 MG capsule TAKE ONE CAPSULE BY MOUTH DAILY 90 capsule 3   losartan (COZAAR) 50 MG tablet Take 1 tablet (50 mg total) by mouth daily. 90 tablet 1   Multiple Vitamins-Minerals (MULTIVITAMIN WITH MINERALS) tablet Take 1 tablet by mouth daily.      pantoprazole (PROTONIX) 40 MG tablet Take 1 tablet (40 mg total) by mouth 2 (two) times daily. 180 tablet 0   Probiotic Product (ALIGN PO) Take by mouth daily.     simvastatin (ZOCOR) 20 MG tablet Take 1 tablet (20 mg total) by mouth at bedtime. 90 tablet 1   traZODone (DESYREL) 50 MG tablet Take 0.5-1 tablets (25-50 mg total) by mouth at bedtime as needed for sleep. 30 tablet 3   simvastatin (ZOCOR) 20 MG tablet Take 1 tablet (20 mg total) by mouth at bedtime. 90 tablet 3   No facility-administered medications prior to visit.   Past Medical History:  Diagnosis Date   Allergic rhinitis    Anxiety    Arthritis    HANDS   Benign liver cyst  multiple   Chronic constipation    Depression    Diverticulosis of colon    Emphysematous COPD (HCC)    External hemorrhoid    GERD (gastroesophageal reflux disease)    History of colon polyps    History of Helicobacter pylori infection    History of TIA (transient ischemic attack)    apr 2013-- no residual   Hyperlipidemia    Occipital neuralgia of left side 12/08/2014   Osteoporosis    Pancreatic cyst    simple benign   PONV (postoperative nausea and vomiting)    Prolapsed internal hemorrhoids, grade 3    Simple renal cyst    right    Wears glasses    Wears partial dentures    UPPER   Past Surgical History:  Procedure Laterality Date   APPENDECTOMY  1960   BREAST EXCISIONAL BIOPSY Left    BREAST EXCISIONAL BIOPSY Right    CATARACT EXTRACTION Left    COLONOSCOPY WITH PROPOFOL  last one 10-25-2013   polypectomy   D & C  HYSTEROSCOPY W/ ENDOMETRIAL POLYPECTOMY     ESOPHAGOGASTRODUODENOSCOPY  last one 03-04-2013   EXCISION BILATERAL BREAST CYST  1995   HEMORRHOID SURGERY N/A 04/06/2014   Procedure: Hemmorhoidopexy with anal canal biopsy;  Surgeon: Romie Levee, MD;  Location: [facility];  Service: General;  Laterality: N/A;   LAPAROSCOPIC RIGHT OVARIAN CYSTECTOMY/  D & C HYSTEROSCOPY ENDOMETRIAL POLYPECTOMY  2011   TONSILLECTOMY AND ADENOIDECTOMY  1955   TRANSTHORACIC ECHOCARDIOGRAM  08/13/2012   normal LV/  ef 60-65%/  mild AR/  trivial MR   Allergies  Allergen Reactions   Codeine Anaphylaxis   Erythromycin Other (See Comments)    "severe GI upset, also all "mycins" meds    Flagyl [Metronidazole] Other (See Comments)    "flu-like symptoms"      Objective:    Physical Exam Vitals and nursing note reviewed.  Constitutional:      Appearance: Normal appearance.  Cardiovascular:     Rate and Rhythm: Normal rate and regular rhythm.  Pulmonary:     Effort: Pulmonary effort is normal.     Breath sounds: Normal breath sounds.  Musculoskeletal:        General: Normal range of motion.  Skin:    General: Skin is warm and dry.  Neurological:     Mental Status: She is alert.  Psychiatric:        Mood and Affect: Mood normal.        Behavior: Behavior normal.   BP (!) 120/54 (BP Location: Left Arm, Patient Position: Sitting, Cuff Size: Large)   Pulse 67   Temp 98.1 F (36.7 C) (Temporal)   Ht 5\' 1"  (1.549 m)   Wt 118 lb 6 oz (53.7 kg)   LMP 06/16/1997   SpO2 99%   BMI 22.37 kg/m  Wt Readings from Last 3 Encounters:  09/12/21 118 lb 6 oz (53.7 kg)  06/11/21 118 lb 6.4 oz (53.7 kg)  04/25/21 120 lb (54.4 kg)       04/27/21, NP

## 2021-09-13 ENCOUNTER — Telehealth: Payer: Self-pay | Admitting: Family

## 2021-09-13 NOTE — Telephone Encounter (Signed)
Patient requests to be called at ph# [phone] to discuss lab results

## 2021-09-16 ENCOUNTER — Ambulatory Visit (INDEPENDENT_AMBULATORY_CARE_PROVIDER_SITE_OTHER): Payer: Medicare Other

## 2021-09-16 ENCOUNTER — Telehealth: Payer: Self-pay | Admitting: Internal Medicine

## 2021-09-16 ENCOUNTER — Telehealth: Payer: Self-pay | Admitting: Family

## 2021-09-16 DIAGNOSIS — R002 Palpitations: Secondary | ICD-10-CM

## 2021-09-16 NOTE — Progress Notes (Signed)
Your white blood cell count is a little low, and it appears it has been low in the past. Do you remember Dr. Beverely Low discussing this with you? Looks like the last time was in April of last year. Your Eosinophils are elevated and this indicates there are allergens in your body and causing your immune system to react, no reason for concern.

## 2021-09-16 NOTE — Telephone Encounter (Signed)
Message was sent to Garner Gavel will review as soon as she can.

## 2021-09-16 NOTE — Progress Notes (Unsigned)
Enrolled for [employer] to mail a ZIO XT long term holter monitor to the patients address on file.  

## 2021-09-16 NOTE — Telephone Encounter (Signed)
See my chart message

## 2021-09-16 NOTE — Telephone Encounter (Signed)
Patient has called in concerned about her CBS results.    Is requesting Judeth Cornfield to review as soon as possible.

## 2021-09-16 NOTE — Telephone Encounter (Signed)
Pt would like a call back to discuss getting a heart monitor that was suggested by Dr. Ladona Ridgel on her last appt. She states that she has been feeling more tired and can see the change in heart rate on her wrist band and would like to go ahead and schedule this. Would like advice on if she needs to come into office for this or not. Please advise.

## 2021-09-18 DIAGNOSIS — R002 Palpitations: Secondary | ICD-10-CM

## 2021-09-26 ENCOUNTER — Ambulatory Visit (INDEPENDENT_AMBULATORY_CARE_PROVIDER_SITE_OTHER): Payer: Medicare Other

## 2021-09-26 VITALS — Wt 117.0 lb

## 2021-09-26 DIAGNOSIS — Z0000 Encounter for general adult medical examination without abnormal findings: Secondary | ICD-10-CM

## 2021-09-26 NOTE — Progress Notes (Signed)
Subjective:   Regina Gonzalez is a 76 y.o. female who presents for Medicare Annual (Subsequent) preventive examination.  Virtual Visit via Telephone Note  I connected with  Regina Gonzalez on 09/26/21 at  9:45 AM EDT by telephone and verified that I am speaking with the correct person using two identifiers.  Location: Patient: Home Provider: [facility] Persons participating in the virtual visit: patient/Nurse Health Advisor   I discussed the limitations, risks, security and privacy concerns of performing an evaluation and management service by telephone and the availability of in person appointments. The patient expressed understanding and agreed to proceed.  Interactive audio and video telecommunications were attempted between this nurse and patient, however failed, due to patient having technical difficulties OR patient did not have access to video capability.  We continued and completed visit with audio only.  Some vital signs may be absent or patient reported.   Regina Mosco E Mathew Postiglione, LPN   Review of Systems     Cardiac Risk Factors include: advanced age (>22men, >84 women);dyslipidemia;hypertension;Other (see comment);smoking/ tobacco exposure, Risk factor comments: hx of TIA     Objective:    Today's Vitals   09/26/21 0949  Weight: 117 lb (53.1 kg)  PainSc: 0-No pain   Body mass index is 22.11 kg/m.     09/26/2021   10:04 AM 08/31/2020   11:13 PM 08/22/2020   11:21 AM 11/14/2019   12:53 PM 01/03/2018    8:27 AM 09/25/2016    8:47 AM 04/06/2014    9:27 AM  Advanced Directives  Does Patient Have a Medical Advance Directive? Yes Yes Yes Yes Yes Yes No  Type of Estate agent of [place];Living will Healthcare Power of [place];Living will Healthcare Power of [place];Living will Healthcare Power of [place];Living will Healthcare Power of [place];Living will Healthcare Power of [place];Living will   Does patient want to make  changes to medical advance directive?  No - Patient declined No - Patient declined      Copy of Healthcare Power of Attorney in Chart? No - copy requested  No - copy requested No - copy requested  No - copy requested   Would patient like information on creating a medical advance directive?       No - patient declined information    Current Medications (verified) Outpatient Encounter Medications as of 09/26/2021  Medication Sig   ALPRAZolam (XANAX) 1 MG tablet TAKE 1/2 TO 1 TABLET BY MOUTH TWO TIMES A DAY TO THREE TIMES A DAY AS NEEDED FOR ANXIETY   Ascorbic Acid (VITAMIN C PO) Take by mouth daily.   Cholecalciferol (VITAMIN D-3) 25 MCG (1000 UT) CAPS Take by mouth.   DULoxetine (CYMBALTA) 60 MG capsule TAKE ONE CAPSULE BY MOUTH DAILY   losartan (COZAAR) 50 MG tablet Take 1 tablet (50 mg total) by mouth daily.   Multiple Vitamins-Minerals (MULTIVITAMIN WITH MINERALS) tablet Take 1 tablet by mouth daily.    pantoprazole (PROTONIX) 40 MG tablet Take 1 tablet (40 mg total) by mouth 2 (two) times daily.   simvastatin (ZOCOR) 20 MG tablet Take 1 tablet (20 mg total) by mouth at bedtime.   diclofenac Sodium (VOLTAREN) 1 % GEL Apply 2 g topically 4 (four) times daily. (Patient not taking: Reported on 09/26/2021)   dicyclomine (BENTYL) 10 MG capsule Take 1 capsule (10 mg total) by mouth daily as needed. (Patient not taking: Reported on 09/26/2021)   Docusate Calcium (STOOL SOFTENER PO) Take by mouth as needed. (Patient not  taking: Reported on 09/26/2021)   Probiotic Product (ALIGN PO) Take by mouth daily. (Patient not taking: Reported on 09/26/2021)   traZODone (DESYREL) 50 MG tablet Take 0.5-1 tablets (25-50 mg total) by mouth at bedtime as needed for sleep. (Patient not taking: Reported on 09/26/2021)   [DISCONTINUED] Calcium Carb-Cholecalciferol (CALCIUM 600 + D PO) Take 2 tablets by mouth daily.   No facility-administered encounter medications on file as of 09/26/2021.    Allergies (verified) Codeine,  Erythromycin, Flagyl [metronidazole], and Alendronate sodium   History: Past Medical History:  Diagnosis Date   Allergic rhinitis    Anxiety    Arthritis    HANDS   Benign liver cyst    multiple   Chronic constipation    Depression    Diverticulosis of colon    Emphysematous COPD (HCC)    External hemorrhoid    GERD (gastroesophageal reflux disease)    History of colon polyps    History of Helicobacter pylori infection    History of TIA (transient ischemic attack)    apr 2013-- no residual   Hyperlipidemia    Occipital neuralgia of left side 12/08/2014   Osteoporosis    Pancreatic cyst    simple benign   PONV (postoperative nausea and vomiting)    Prolapsed internal hemorrhoids, grade 3    Simple renal cyst    right    Wears glasses    Wears partial dentures    UPPER   Past Surgical History:  Procedure Laterality Date   APPENDECTOMY  1960   BREAST EXCISIONAL BIOPSY Left    BREAST EXCISIONAL BIOPSY Right    CATARACT EXTRACTION Left    COLONOSCOPY WITH PROPOFOL  last one 10-25-2013   polypectomy   D & C HYSTEROSCOPY W/ ENDOMETRIAL POLYPECTOMY     ESOPHAGOGASTRODUODENOSCOPY  last one 03-04-2013   EXCISION BILATERAL BREAST CYST  1995   HEMORRHOID SURGERY N/A 04/06/2014   Procedure: Hemmorhoidopexy with anal canal biopsy;  Surgeon: Romie Levee, MD;  Location: [facility];  Service: General;  Laterality: N/A;   LAPAROSCOPIC RIGHT OVARIAN CYSTECTOMY/  D & C HYSTEROSCOPY ENDOMETRIAL POLYPECTOMY  2011   TONSILLECTOMY AND ADENOIDECTOMY  1955   TRANSTHORACIC ECHOCARDIOGRAM  08/13/2012   normal LV/  ef 60-65%/  mild AR/  trivial MR   Family History  Problem Relation Age of Onset   Cirrhosis Mother        non-alcoholic   Alcoholism Father    Multiple sclerosis Daughter    Pancreatitis Daughter    Diabetes type II Brother    Heart attack Brother    Colon cancer Neg Hx    Breast cancer Neg Hx    Social History   Socioeconomic History   Marital  status: Widowed    Spouse name: Channing Mutters   Number of children: 2   Years of education: 12   Highest education level: Not on file  Occupational History   Occupation: retired  Tobacco Use   Smoking status: Former    Packs/day: 1.00    Years: 17.00    Total pack years: 17.00    Types: Cigarettes    Quit date: 04/02/1988    Years since quitting: 33.5   Smokeless tobacco: Never  Vaping Use   Vaping Use: Never used  Substance and Sexual Activity   Alcohol use: No   Drug use: No   Sexual activity: Not on file  Other Topics Concern   Not on file  Social History Narrative   Patient  lives at home with her grandson.  Husband Channing Mutters) died 2017-06-08 as did her daughter Sharyl Nimrod.  Works for [employer]. Patient has high school education and two children (1 deceased). Caffeine 2-3 cups daily.   Right handed    Social Determinants of Health   Financial Resource Strain: Low Risk  (09/26/2021)   Overall Financial Resource Strain (CARDIA)    Difficulty of Paying Living Expenses: Not hard at all  Food Insecurity: No Food Insecurity (09/26/2021)   Hunger Vital Sign    Worried About Running Out of Food in the Last Year: Never true    Ran Out of Food in the Last Year: Never true  Transportation Needs: No Transportation Needs (09/26/2021)   PRAPARE - Administrator, Civil Service (Medical): No    Lack of Transportation (Non-Medical): No  Physical Activity: Sufficiently Active (09/26/2021)   Exercise Vital Sign    Days of Exercise per Week: 7 days    Minutes of Exercise per Session: 30 min  Stress: No Stress Concern Present (09/26/2021)   [employer] of Occupational Health - Occupational Stress Questionnaire    Feeling of Stress : Only a little  Social Connections: Moderately Integrated (09/26/2021)   Social Connection and Isolation Panel [NHANES]    Frequency of Communication with Friends and Family: More than three times a week    Frequency of Social Gatherings with  Friends and Family: Twice a week    Attends Religious Services: More than 4 times per year    Active Member of [employer] or Organizations: Yes    Attends Banker Meetings: More than 4 times per year    Marital Status: Widowed    Tobacco Counseling Counseling given: Not Answered   Clinical Intake:  Pre-visit preparation completed: Yes  Pain : No/denies pain Pain Score: 0-No pain     BMI - recorded: 22.11 Nutritional Status: BMI of 19-24  Normal Nutritional Risks: None Diabetes: No  How often do you need to have someone help you when you read instructions, pamphlets, or other written materials from your doctor or pharmacy?: 1 - Never  Diabetic? no  Interpreter Needed?: No  Information entered by :: Melanye Hiraldo, LPN   Activities of Daily Living    09/26/2021    9:59 AM 06/11/2021   10:00 AM  In your present state of health, do you have any difficulty performing the following activities:  Hearing? 0 0  Vision? 0 0  Difficulty concentrating or making decisions? 0 0  Walking or climbing stairs? 0 0  Dressing or bathing? 0 0  Doing errands, shopping? 0 0  Preparing Food and eating ? N   Using the Toilet? N   In the past six months, have you accidently leaked urine? N   Do you have problems with loss of bowel control? N   Managing your Medications? N   Managing your Finances? N   Housekeeping or managing your Housekeeping? N     Patient Care Team: Sheliah Hatch, MD as PCP - General Marinus Maw, MD as PCP - Electrophysiology (Cardiology) Willis Modena, MD as Consulting Physician (Gastroenterology) Romie Levee, MD as Consulting Physician (General Surgery) Noel Journey as Physician Assistant (Family Medicine) Celso Kendra Grissett, PA-C as Physician Assistant (Physician Assistant)  Indicate any recent Medical Services you may have received from other than [facility] providers in the past year (date may be approximate).     Assessment:   This  is  a routine wellness examination for [place].  Hearing/Vision screen Hearing Screening - Comments:: Denies hearing difficulties   Vision Screening - Comments:: Wears rx glasses - up to date with routine eye exams with [facility]  Dietary issues and exercise activities discussed: Current Exercise Habits: Home exercise routine, Type of exercise: walking (6000-7000 steps per day - uses stairs several times per day), Time (Minutes): 30, Frequency (Times/Week): 7, Weekly Exercise (Minutes/Week): 210, Intensity: Mild, Exercise limited by: orthopedic condition(s)   Goals Addressed             This Visit's Progress    Patient Stated   On track    Maintain current level of activity Save more money Continue helping grandson - he is getting ready to go to college       Depression Screen    09/26/2021   10:01 AM 09/12/2021    8:49 AM 06/11/2021   10:01 AM 12/21/2020    1:43 PM 12/11/2020   10:22 AM 10/17/2020   11:47 AM 08/22/2020   11:17 AM  PHQ 2/9 Scores  PHQ - 2 Score 2 0 2 0 2 0 1  PHQ- 9 Score 4 0 11  3      Fall Risk    09/26/2021    9:51 AM 06/11/2021   10:00 AM 12/21/2020    1:43 PM 12/11/2020   10:22 AM 08/22/2020   11:19 AM  Fall Risk   Falls in the past year? 1 0 0 0 0  Number falls in past yr: 0 0 0  0  Injury with Fall? 1 0   0  Risk for fall due to : History of fall(s);Orthopedic patient No Fall Risks  No Fall Risks No Fall Risks  Follow up Falls prevention discussed;Education provided Falls evaluation completed  Falls evaluation completed Education provided;Falls prevention discussed    FALL RISK PREVENTION PERTAINING TO THE HOME:  Any stairs in or around the home? Yes  If so, are there any without handrails? No  Home free of loose throw rugs in walkways, pet beds, electrical cords, etc? Yes  Adequate lighting in your home to reduce risk of falls? Yes   ASSISTIVE DEVICES UTILIZED TO PREVENT FALLS:  Life alert? No  Use of a cane, walker or w/c? No  Grab bars in  the bathroom? Yes  Shower chair or bench in shower? No  Elevated toilet seat or a handicapped toilet? No   TIMED UP AND GO:  Was the test performed? No . Telephonic visit  Cognitive Function:        09/26/2021   10:05 AM 11/14/2019    1:03 PM  6CIT Screen  What Year? 0 points 0 points  What month? 0 points 0 points  What time? 0 points 0 points  Count back from 20 0 points 0 points  Months in reverse 0 points 0 points  Repeat phrase 0 points 0 points  Total Score 0 points 0 points    Immunizations Immunization History  Administered Date(s) Administered   Fluad Quad(high Dose 65+) 10/27/2018, 10/11/2019   Influenza Split 01/08/2011   Influenza Whole 11/03/2009   Influenza, High Dose Seasonal PF 11/23/2017   Influenza,inj,Quad PF,6+ Mos 11/01/2012, 11/25/2013, 12/03/2016   Influenza-Unspecified 10/25/2020   PFIZER(Purple Top)SARS-COV-2 Vaccination 03/20/2019, 04/12/2019, 11/15/2019   Pneumococcal Conjugate-13 09/23/2013   Pneumococcal Polysaccharide-23 09/25/2015   Td 02/04/2008   Tdap 12/24/2019   Zoster Recombinat (Shingrix) 11/28/2019, 05/11/2020    TDAP status: Up to date  Flu Vaccine  status: Up to date  Pneumococcal vaccine status: Up to date  Covid-19 vaccine status: Completed vaccines  Qualifies for Shingles Vaccine? Yes   Zostavax completed Yes   Shingrix Completed?: Yes  Screening Tests Health Maintenance  Topic Date Due   INFLUENZA VACCINE  09/03/2021   MAMMOGRAM  06/20/2022   DEXA SCAN  07/03/2023   COLONOSCOPY (Pts 45-18yrs Insurance coverage will need to be confirmed)  06/22/2029   TETANUS/TDAP  12/23/2029   Pneumonia Vaccine 6+ Years old  Completed   Hepatitis C Screening  Completed   Zoster Vaccines- Shingrix  Completed   HPV VACCINES  Aged Out   COVID-19 Vaccine  Discontinued    Health Maintenance  Health Maintenance Due  Topic Date Due   INFLUENZA VACCINE  09/03/2021    Colorectal cancer screening: Type of screening:  Colonoscopy. Completed 06/23/2019. Repeat every 10 years  Mammogram status: Completed 06/19/2021. Repeat every year  Bone Density status: Completed 07/02/2021. Results reflect: Bone density results: OSTEOPOROSIS. Repeat every 2 years.  Lung Cancer Screening: (Low Dose CT Chest recommended if Age 48-80 years, 30 pack-year currently smoking OR have quit w/in 15years.) does not qualify.   Additional Screening:  Hepatitis C Screening: does qualify; Completed 09/25/2015  Vision Screening: Recommended annual ophthalmology exams for early detection of glaucoma and other disorders of the eye. Is the patient up to date with their annual eye exam?  Yes  Who is the provider or what is the name of the office in which the patient attends annual eye exams? [facility] If pt is not established with a provider, would they like to be referred to a provider to establish care? No .   Dental Screening: Recommended annual dental exams for proper oral hygiene  Community Resource Referral / Chronic Care Management: CRR required this visit?  No   CCM required this visit?  No      Plan:     I have personally reviewed and noted the following in the patient's chart:   Medical and social history Use of alcohol, tobacco or illicit drugs  Current medications and supplements including opioid prescriptions. Patient is not currently taking opioid prescriptions. Functional ability and status Nutritional status Physical activity Advanced directives List of other physicians Hospitalizations, surgeries, and ER visits in previous 12 months Vitals Screenings to include cognitive, depression, and falls Referrals and appointments  In addition, I have reviewed and discussed with patient certain preventive protocols, quality metrics, and best practice recommendations. A written personalized care plan for preventive services as well as general preventive health recommendations were provided to patient.     Sandrea Hammond, LPN   [id number]   Nurse Notes:  none

## 2021-09-26 NOTE — Patient Instructions (Signed)
Regina Gonzalez , Thank you for taking time to come for your Medicare Wellness Visit. I appreciate your ongoing commitment to your health goals. Please review the following plan we discussed and let me know if I can assist you in the future.   Screening recommendations/referrals: Colonoscopy: Done 06/23/2019 - Repeat in 10 years Mammogram: Done 06/19/2021 - Repeat annually  Bone Density: Done 07/02/2021 - Repeat every 2 years  Recommended yearly ophthalmology/optometry visit for glaucoma screening and checkup Recommended yearly dental visit for hygiene and checkup  Vaccinations: Influenza vaccine: Done 10/25/2020 - Repeat annually  Pneumococcal vaccine: Done  09/23/2013 & 09/25/2015   Tdap vaccine: Done 12/24/2019 - Repeat in 10 years Shingles vaccine: Done 11/28/2019 & 05/11/2020   Covid-19: Done 03/20/2019, 04/12/2019, 11/15/2019 - for boosters contact pharmacy  Advanced directives: Please bring a copy of your health care power of attorney and living will to the office to be added to your chart at your convenience.   Conditions/risks identified: Aim for 30 minutes of exercise or brisk walking, 6-8 glasses of water, and 5 servings of fruits and vegetables each day.   Next appointment: Follow up in one year for your annual wellness visit    Preventive Care 65 Years and Older, Female Preventive care refers to lifestyle choices and visits with your health care provider that can promote health and wellness. What does preventive care include? A yearly physical exam. This is also called an annual well check. Dental exams once or twice a year. Routine eye exams. Ask your health care provider how often you should have your eyes checked. Personal lifestyle choices, including: Daily care of your teeth and gums. Regular physical activity. Eating a healthy diet. Avoiding tobacco and drug use. Limiting alcohol use. Practicing safe sex. Taking low-dose aspirin every day. Taking vitamin and mineral  supplements as recommended by your health care provider. What happens during an annual well check? The services and screenings done by your health care provider during your annual well check will depend on your age, overall health, lifestyle risk factors, and family history of disease. Counseling  Your health care provider may ask you questions about your: Alcohol use. Tobacco use. Drug use. Emotional well-being. Home and relationship well-being. Sexual activity. Eating habits. History of falls. Memory and ability to understand (cognition). Work and work Astronomer. Reproductive health. Screening  You may have the following tests or measurements: Height, weight, and BMI. Blood pressure. Lipid and cholesterol levels. These may be checked every 5 years, or more frequently if you are over 63 years old. Skin check. Lung cancer screening. You may have this screening every year starting at age 51 if you have a 30-pack-year history of smoking and currently smoke or have quit within the past 15 years. Fecal occult blood test (FOBT) of the stool. You may have this test every year starting at age 8. Flexible sigmoidoscopy or colonoscopy. You may have a sigmoidoscopy every 5 years or a colonoscopy every 10 years starting at age 13. Hepatitis C blood test. Hepatitis B blood test. Sexually transmitted disease (STD) testing. Diabetes screening. This is done by checking your blood sugar (glucose) after you have not eaten for a while (fasting). You may have this done every 1-3 years. Bone density scan. This is done to screen for osteoporosis. You may have this done starting at age 59. Mammogram. This may be done every 1-2 years. Talk to your health care provider about how often you should have regular mammograms. Talk with your health care  provider about your test results, treatment options, and if necessary, the need for more tests. Vaccines  Your health care provider may recommend certain  vaccines, such as: Influenza vaccine. This is recommended every year. Tetanus, diphtheria, and acellular pertussis (Tdap, Td) vaccine. You may need a Td booster every 10 years. Zoster vaccine. You may need this after age 84. Pneumococcal 13-valent conjugate (PCV13) vaccine. One dose is recommended after age 2. Pneumococcal polysaccharide (PPSV23) vaccine. One dose is recommended after age 35. Talk to your health care provider about which screenings and vaccines you need and how often you need them. This information is not intended to replace advice given to you by your health care provider. Make sure you discuss any questions you have with your health care provider. Document Released: 02/16/2015 Document Revised: 10/10/2015 Document Reviewed: 11/21/2014 Elsevier Interactive Patient Education  2017 [place] Prevention in the Home Falls can cause injuries. They can happen to people of all ages. There are many things you can do to make your home safe and to help prevent falls. What can I do on the outside of my home? Regularly fix the edges of walkways and driveways and fix any cracks. Remove anything that might make you trip as you walk through a door, such as a raised step or threshold. Trim any bushes or trees on the path to your home. Use bright outdoor lighting. Clear any walking paths of anything that might make someone trip, such as rocks or tools. Regularly check to see if handrails are loose or broken. Make sure that both sides of any steps have handrails. Any raised decks and porches should have guardrails on the edges. Have any leaves, snow, or ice cleared regularly. Use sand or salt on walking paths during winter. Clean up any spills in your garage right away. This includes oil or grease spills. What can I do in the bathroom? Use night lights. Install grab bars by the toilet and in the tub and shower. Do not use towel bars as grab bars. Use non-skid mats or decals in  the tub or shower. If you need to sit down in the shower, use a plastic, non-slip stool. Keep the floor dry. Clean up any water that spills on the floor as soon as it happens. Remove soap buildup in the tub or shower regularly. Attach bath mats securely with double-sided non-slip rug tape. Do not have throw rugs and other things on the floor that can make you trip. What can I do in the bedroom? Use night lights. Make sure that you have a light by your bed that is easy to reach. Do not use any sheets or blankets that are too big for your bed. They should not hang down onto the floor. Have a firm chair that has side arms. You can use this for support while you get dressed. Do not have throw rugs and other things on the floor that can make you trip. What can I do in the kitchen? Clean up any spills right away. Avoid walking on wet floors. Keep items that you use a lot in easy-to-reach places. If you need to reach something above you, use a strong step stool that has a grab bar. Keep electrical cords out of the way. Do not use floor polish or wax that makes floors slippery. If you must use wax, use non-skid floor wax. Do not have throw rugs and other things on the floor that can make you trip. What can I  do with my stairs? Do not leave any items on the stairs. Make sure that there are handrails on both sides of the stairs and use them. Fix handrails that are broken or loose. Make sure that handrails are as long as the stairways. Check any carpeting to make sure that it is firmly attached to the stairs. Fix any carpet that is loose or worn. Avoid having throw rugs at the top or bottom of the stairs. If you do have throw rugs, attach them to the floor with carpet tape. Make sure that you have a light switch at the top of the stairs and the bottom of the stairs. If you do not have them, ask someone to add them for you. What else can I do to help prevent falls? Wear shoes that: Do not have high  heels. Have rubber bottoms. Are comfortable and fit you well. Are closed at the toe. Do not wear sandals. If you use a stepladder: Make sure that it is fully opened. Do not climb a closed stepladder. Make sure that both sides of the stepladder are locked into place. Ask someone to hold it for you, if possible. Clearly mark and make sure that you can see: Any grab bars or handrails. First and last steps. Where the edge of each step is. Use tools that help you move around (mobility aids) if they are needed. These include: Canes. Walkers. Scooters. Crutches. Turn on the lights when you go into a dark area. Replace any light bulbs as soon as they burn out. Set up your furniture so you have a clear path. Avoid moving your furniture around. If any of your floors are uneven, fix them. If there are any pets around you, be aware of where they are. Review your medicines with your doctor. Some medicines can make you feel dizzy. This can increase your chance of falling. Ask your doctor what other things that you can do to help prevent falls. This information is not intended to replace advice given to you by your health care provider. Make sure you discuss any questions you have with your health care provider. Document Released: 11/16/2008 Document Revised: 06/28/2015 Document Reviewed: 02/24/2014 Elsevier Interactive Patient Education  2017 [employer].

## 2021-10-09 ENCOUNTER — Telehealth: Payer: Self-pay | Admitting: Internal Medicine

## 2021-10-09 NOTE — Telephone Encounter (Signed)
Pt called [facility] wanting to know if Dr. Ladona Ridgel had reviewed her monitor results.    Pt called back and thanked for reaching out to [place].  Pt made told we have not reviewed the monitor results yet, but will reach out to her in the near future.  Pt understood, no f/u required.

## 2021-10-09 NOTE — Telephone Encounter (Signed)
Pt is requesting call back in regards to her heart monitor.

## 2021-10-11 ENCOUNTER — Telehealth: Payer: Self-pay | Admitting: Family Medicine

## 2021-10-11 NOTE — Telephone Encounter (Signed)
Pt dropped medical paper work for Dr.Tabori. Paperwork will be in front bin

## 2021-10-11 NOTE — Telephone Encounter (Signed)
Will place in Dr Beverely Low to be signed folder once signed we will send to correct place

## 2021-10-15 ENCOUNTER — Telehealth: Payer: Self-pay | Admitting: Internal Medicine

## 2021-10-15 NOTE — Telephone Encounter (Signed)
Pt is requesting call back in regards to heart monitor results. 

## 2021-10-16 ENCOUNTER — Telehealth: Payer: Self-pay

## 2021-10-16 NOTE — Telephone Encounter (Signed)
See the details in my telephone note.  Pt called back and all monitor results / questions / education addressed.

## 2021-10-16 NOTE — Telephone Encounter (Signed)
Pt called back regarding monitor results from Dr. Ladona Ridgel.    NSR with sinus brady and sinus tachycardia Rare and very brief episodes of NS AT Rare PAC's and PVC's No VT or sustained SVT No atrial fib or prolonged pauses   Gregg Taylor,MD   Patch Wear Time:  12 days and 16 hours (2023-08-16T16:37:32-0400 to 2023-08-29T09:06:13-0400)   Patient had a min HR of 51 bpm, max HR of 167 bpm, and avg HR of 70 bpm. Predominant underlying rhythm was Sinus Rhythm. 4 Supraventricular Tachycardia runs occurred, the run with the fastest interval lasting 7 beats with a max rate of 167 bpm, the  longest lasting 8 beats with an avg rate of 126 bpm. Some episodes of Supraventricular Tachycardia may be possible Atrial Tachycardia with variable block. Isolated SVEs were rare (<1.0%), SVE Couplets were rare (<1.0%), and SVE Triplets were rare  (<1.0%). Isolated VEs were rare (<1.0%), and no VE Couplets or VE Triplets were present ---------  The report details were shared with the patient via telephone.  Pt understood the presentation of the monitor report findings, and advised reach out to [place] if her symptoms increase of worsen.  No f/u required at this time, and Pt will follow Dr. Lubertha Basque POC.

## 2021-11-20 ENCOUNTER — Telehealth: Payer: Self-pay | Admitting: Student

## 2021-11-20 NOTE — Telephone Encounter (Signed)
Pt called back regarding HR increases that have been occurring more frequently.    Pt states her watch has been reading her HR between 80-140 BPM, that have lasted 6-10 minutes in length.    Pt states these HR increases have occurred on Saturday 10/14, Sunday 10/15, and Wednesday 10/18.    Pt wanted this information recorded.  She is NOT symptomatic with these events.  Pt states they don't last long.    Pt advised to keep a log of when these events occur over the next 5-7 days.  Since she is asymptomatic, and her HR decreases beating speed quickly, will continue to monitor.  If it keeps occurring, I will consult Dr. Lovena Le for possible appointment with APP.  Pt recently wore Zio Monitor.    Pt also advised / educated if HR does not decrease, and she has symptoms to call us, or if we are closed to go to nearest ER.  Pt understood, but will follow up with [facility] if this continues.

## 2021-11-20 NOTE — Telephone Encounter (Signed)
STAT if HR is under 50 or over 120 (normal HR is 60-100 beats per minute)  What is your heart rate? 86 to 144 for 10 minutes  Do you have a log of your heart rate readings (document readings)?   Do you have any other symptoms? No symptoms at this time

## 2021-11-22 ENCOUNTER — Other Ambulatory Visit: Payer: Self-pay | Admitting: Gastroenterology

## 2021-11-22 DIAGNOSIS — R1310 Dysphagia, unspecified: Secondary | ICD-10-CM

## 2021-11-27 ENCOUNTER — Ambulatory Visit
Admission: RE | Admit: 2021-11-27 | Discharge: 2021-11-27 | Disposition: A | Discharge status: Home / Self Care | Payer: Medicare Other | Source: Ambulatory Visit | Attending: Gastroenterology | Admitting: Gastroenterology

## 2021-11-27 DIAGNOSIS — R1310 Dysphagia, unspecified: Secondary | ICD-10-CM

## 2021-12-06 ENCOUNTER — Other Ambulatory Visit: Payer: Self-pay

## 2021-12-06 MED ORDER — DULOXETINE HCL 60 MG PO CPEP: 60.0000 mg | ORAL_CAPSULE | Freq: Every day | ORAL | 1 refills | Status: DC

## 2021-12-11 ENCOUNTER — Ambulatory Visit (INDEPENDENT_AMBULATORY_CARE_PROVIDER_SITE_OTHER): Payer: Medicare Other | Admitting: Family Medicine

## 2021-12-11 ENCOUNTER — Encounter: Payer: Self-pay | Admitting: Family Medicine

## 2021-12-11 VITALS — BP 112/52 | HR 69 | Temp 98.9°F | Resp 17 | Ht 61.0 in | Wt 119.4 lb

## 2021-12-11 DIAGNOSIS — E7800 Pure hypercholesterolemia, unspecified: Secondary | ICD-10-CM | POA: Diagnosis not present

## 2021-12-11 DIAGNOSIS — I10 Essential (primary) hypertension: Secondary | ICD-10-CM

## 2021-12-11 LAB — CBC WITH DIFFERENTIAL/PLATELET
Basophils Absolute: 0 10*3/uL (ref 0.0–0.1)
Basophils Relative: 0.8 % (ref 0.0–3.0)
Eosinophils Absolute: 0.2 10*3/uL (ref 0.0–0.7)
Eosinophils Relative: 4.4 % (ref 0.0–5.0)
HCT: 35.6 % — ABNORMAL LOW (ref 36.0–46.0)
Hemoglobin: 11.9 g/dL — ABNORMAL LOW (ref 12.0–15.0)
Lymphocytes Relative: 32.5 % (ref 12.0–46.0)
Lymphs Abs: 1.7 10*3/uL (ref 0.7–4.0)
MCHC: 33.5 g/dL (ref 30.0–36.0)
MCV: 89.7 fl (ref 78.0–100.0)
Monocytes Absolute: 0.4 10*3/uL (ref 0.1–1.0)
Monocytes Relative: 8.1 % (ref 3.0–12.0)
Neutro Abs: 2.9 10*3/uL (ref 1.4–7.7)
Neutrophils Relative %: 54.2 % (ref 43.0–77.0)
Platelets: 249 10*3/uL (ref 150.0–400.0)
RBC: 3.97 Mil/uL (ref 3.87–5.11)
RDW: 14.1 % (ref 11.5–15.5)
WBC: 5.4 10*3/uL (ref 4.0–10.5)

## 2021-12-11 LAB — HEPATIC FUNCTION PANEL
ALT: 18 U/L (ref 0–35)
AST: 23 U/L (ref 0–37)
Albumin: 4.3 g/dL (ref 3.5–5.2)
Alkaline Phosphatase: 57 U/L (ref 39–117)
Bilirubin, Direct: 0.1 mg/dL (ref 0.0–0.3)
Total Bilirubin: 0.4 mg/dL (ref 0.2–1.2)
Total Protein: 6.5 g/dL (ref 6.0–8.3)

## 2021-12-11 LAB — LIPID PANEL
Cholesterol: 173 mg/dL (ref 0–200)
HDL: 82.2 mg/dL (ref >39.00)
LDL Cholesterol: 77 mg/dL (ref 0–99)
NonHDL: 91.06
Total CHOL/HDL Ratio: 2
Triglycerides: 70 mg/dL (ref 0.0–149.0)
VLDL: 14 mg/dL (ref 0.0–40.0)

## 2021-12-11 LAB — BASIC METABOLIC PANEL
BUN: 10 mg/dL (ref 6–23)
CO2: 32 mEq/L (ref 19–32)
Calcium: 9.5 mg/dL (ref 8.4–10.5)
Chloride: 102 mEq/L (ref 96–112)
Creatinine, Ser: 0.96 mg/dL (ref 0.40–1.20)
GFR: 58.33 mL/min — ABNORMAL LOW (ref >60.00)
Glucose, Bld: 87 mg/dL (ref 70–99)
Potassium: 4 mEq/L (ref 3.5–5.1)
Sodium: 138 mEq/L (ref 135–145)

## 2021-12-11 LAB — TSH: TSH: 1.82 u[IU]/mL (ref 0.35–5.50)

## 2021-12-11 NOTE — Progress Notes (Signed)
   Subjective:    Patient ID: Regina Gonzalez, female    DOB: 1945/02/26, 76 y.o.   MRN: [id number]  HPI HTN- chronic problem, on Losartan 50mg  daily.  Today BP is running low but she reports this is labile.  Pt is working w/ Cardiology bc she continues to have palpitations.  No CP, SOB, HA's, visual changes, edema.  Hyperlipidemia- chronic problem, on Simvastatin 20mg  daily.  Denies abd pain, N/V.   Review of Systems For ROS see HPI     Objective:   Physical Exam Vitals reviewed.  Constitutional:      General: She is not in acute distress.    Appearance: Normal appearance. She is well-developed. She is not ill-appearing.  HENT:     Head: Normocephalic and atraumatic.  Eyes:     Conjunctiva/sclera: Conjunctivae normal.     Pupils: Pupils are equal, round, and reactive to light.  Neck:     Thyroid: No thyromegaly.  Cardiovascular:     Rate and Rhythm: Normal rate and regular rhythm.     Pulses: Normal pulses.     Heart sounds: Normal heart sounds. No murmur heard. Pulmonary:     Effort: Pulmonary effort is normal. No respiratory distress.     Breath sounds: Normal breath sounds.  Abdominal:     General: There is no distension.     Palpations: Abdomen is soft.     Tenderness: There is no abdominal tenderness.  Musculoskeletal:     Cervical back: Normal range of motion and neck supple.     Right lower leg: No edema.     Left lower leg: No edema.  Lymphadenopathy:     Cervical: No cervical adenopathy.  Skin:    General: Skin is warm and dry.  Neurological:     General: No focal deficit present.     Mental Status: She is alert and oriented to person, place, and time.  Psychiatric:        Mood and Affect: Mood normal.        Behavior: Behavior normal.           Assessment & Plan:

## 2021-12-11 NOTE — Assessment & Plan Note (Signed)
Chronic problem.  Pt's BP is actually over controlled today on Losartan 50mg  daily.  She reports BP's have been labile as she continues to have palpitations that occur at rest.  She is working w/ cardiology on this issue.  Will decrease Losartan to 1/2 tab daily and have her return in a few weeks to re-assess.  Pt expressed understanding and is in agreement w/ plan.

## 2021-12-11 NOTE — Patient Instructions (Signed)
Follow up in 3-4 weeks to recheck BP We'll notify you of your lab results and make any changes if needed DECREASE the Losartan to 1/2 tab daily Keep up the good work!  You look great!!! Call with any questions or concerns Hang in there!!

## 2021-12-11 NOTE — Assessment & Plan Note (Signed)
Chronic problem.  Currently on Simvastatin 20mg daily w/o difficulty.  Check labs.  Adjust meds prn  

## 2021-12-16 ENCOUNTER — Telehealth: Payer: Self-pay | Admitting: Internal Medicine

## 2021-12-16 NOTE — Telephone Encounter (Signed)
   Pt is calling to f/u her heart rate issue. Also, she had a barium swallow test recently 11/27/21 and is shows a lot of heart related issue. She wants to discuss that as well

## 2021-12-16 NOTE — Telephone Encounter (Signed)
Patient wanted to let Dr. Ladona Ridgel know that she recently had a barium swallow test that showed some abnormal results cardiac related. She has requested Dr. Dulce Sellar with [facility] send the results for Dr. Ladona Ridgel to review.   It has been almost a year since [facility], advised her to schedule an appointment to further discuss any heart related concerns she may have. Patient agreed to do this. She has an appointment with her PCP on 01/01/2022.

## 2021-12-17 ENCOUNTER — Ambulatory Visit (INDEPENDENT_AMBULATORY_CARE_PROVIDER_SITE_OTHER): Payer: Medicare Other | Admitting: Family

## 2021-12-17 ENCOUNTER — Encounter: Payer: Self-pay | Admitting: Family

## 2021-12-17 VITALS — BP 112/50 | HR 62 | Temp 98.0°F | Ht 61.0 in | Wt 120.2 lb

## 2021-12-17 DIAGNOSIS — R0989 Other specified symptoms and signs involving the circulatory and respiratory systems: Secondary | ICD-10-CM | POA: Diagnosis not present

## 2021-12-17 DIAGNOSIS — J069 Acute upper respiratory infection, unspecified: Secondary | ICD-10-CM | POA: Diagnosis not present

## 2021-12-17 LAB — POC COVID19 BINAXNOW: SARS Coronavirus 2 Ag: NEGATIVE

## 2021-12-17 MED ORDER — METHYLPREDNISOLONE 4 MG PO TBPK: ORAL_TABLET | ORAL | 0 refills | Status: DC

## 2021-12-17 NOTE — Progress Notes (Signed)
Acute Office Visit  Subjective:     Patient ID: Regina Gonzalez, female    DOB: April 11, 1945, 76 y.o.   MRN: [id number]  Chief Complaint  Patient presents with   Nasal Congestion    Pt states she has had congestion for 4 days, chest congestion and face head pain, sore throat     HPI Patient is in today with c/o congestion, facial pain, cough x 4 days. She has not been taking any medication for relief. No known sick contact.   Review of Systems  Constitutional:  Negative for chills and fever.  HENT:  Positive for congestion and sinus pain.   Respiratory:  Positive for cough. Negative for shortness of breath and wheezing.   Cardiovascular: Negative.   Musculoskeletal: Negative.   Neurological: Negative.   Psychiatric/Behavioral: Negative.    All other systems reviewed and are negative. Past Medical History:  Diagnosis Date   Allergic rhinitis    Anxiety    Arthritis    HANDS   Benign liver cyst    multiple   Chronic constipation    Depression    Diverticulosis of colon    Emphysematous COPD ([facility]CC)    External hemorrhoid    GERD (gastroesophageal reflux disease)    History of colon polyps    History of Helicobacter pylori infection    History of TIA (transient ischemic attack)    apr May 24, 2011-- no residual   Hyperlipidemia    Occipital neuralgia of left side 12/08/2014   Osteoporosis    Pancreatic cyst    simple benign   PONV (postoperative nausea and vomiting)    Prolapsed internal hemorrhoids, grade 3    Simple renal cyst    right    Wears glasses    Wears partial dentures    UPPER    Social History   Socioeconomic History   Marital status: Widowed    Spouse name: Channing Mutters   Number of children: 2   Years of education: 12   Highest education level: Not on file  Occupational History   Occupation: retired  Tobacco Use   Smoking status: Former    Packs/day: 1.00    Years: 17.00    Total pack years: 17.00    Types: Cigarettes    Quit date: 04/02/1988     Years since quitting: 33.7   Smokeless tobacco: Never  Vaping Use   Vaping Use: Never used  Substance and Sexual Activity   Alcohol use: No   Drug use: No   Sexual activity: Not on file  Other Topics Concern   Not on file  Social History Narrative   Patient lives at home with her grandson.  Husband Channing Mutters) died May 23, 2017 as did her daughter Sharyl Nimrod.  Works for [employer]. Patient has high school education and two children (1 deceased). Caffeine 2-3 cups daily.   Right handed    Social Determinants of Health   Financial Resource Strain: Low Risk  (09/26/2021)   Overall Financial Resource Strain (CARDIA)    Difficulty of Paying Living Expenses: Not hard at all  Food Insecurity: No Food Insecurity (09/26/2021)   Hunger Vital Sign    Worried About Running Out of Food in the Last Year: Never true    Ran Out of Food in the Last Year: Never true  Transportation Needs: No Transportation Needs (09/26/2021)   PRAPARE - Administrator, Civil Service (Medical): No    Lack of Transportation (Non-Medical): No  Physical Activity: Sufficiently Active (09/26/2021)   Exercise Vital Sign    Days of Exercise per Week: 7 days    Minutes of Exercise per Session: 30 min  Stress: No Stress Concern Present (09/26/2021)   [employer] of Occupational Health - Occupational Stress Questionnaire    Feeling of Stress : Only a little  Social Connections: Moderately Integrated (09/26/2021)   Social Connection and Isolation Panel [NHANES]    Frequency of Communication with Friends and Family: More than three times a week    Frequency of Social Gatherings with Friends and Family: Twice a week    Attends Religious Services: More than 4 times per year    Active Member of [employer] or Organizations: Yes    Attends Banker Meetings: More than 4 times per year    Marital Status: Widowed  Intimate Partner Violence: Not At Risk (09/26/2021)   Humiliation, Afraid, Rape, and Kick  questionnaire    Fear of Current or Ex-Partner: No    Emotionally Abused: No    Physically Abused: No    Sexually Abused: No    Past Surgical History:  Procedure Laterality Date   APPENDECTOMY  1960   BREAST EXCISIONAL BIOPSY Left    BREAST EXCISIONAL BIOPSY Right    CATARACT EXTRACTION Left    COLONOSCOPY WITH PROPOFOL  last one 10-25-2013   polypectomy   D & C HYSTEROSCOPY W/ ENDOMETRIAL POLYPECTOMY     ESOPHAGOGASTRODUODENOSCOPY  last one 03-04-2013   EXCISION BILATERAL BREAST CYST  1995   HEMORRHOID SURGERY N/A 04/06/2014   Procedure: Hemmorhoidopexy with anal canal biopsy;  Surgeon: Romie Levee, MD;  Location: [facility];  Service: General;  Laterality: N/A;   LAPAROSCOPIC RIGHT OVARIAN CYSTECTOMY/  D & C HYSTEROSCOPY ENDOMETRIAL POLYPECTOMY  2011   TONSILLECTOMY AND ADENOIDECTOMY  1955   TRANSTHORACIC ECHOCARDIOGRAM  08/13/2012   normal LV/  ef 60-65%/  mild AR/  trivial MR    Family History  Problem Relation Age of Onset   Cirrhosis Mother        non-alcoholic   Alcoholism Father    Multiple sclerosis Daughter    Pancreatitis Daughter    Diabetes type II Brother    Heart attack Brother    Colon cancer Neg Hx    Breast cancer Neg Hx     Allergies  Allergen Reactions   Codeine Anaphylaxis   Erythromycin Other (See Comments)    "severe GI upset, also all "mycins" meds    Flagyl [Metronidazole] Other (See Comments)    "flu-like symptoms"   Alendronate Sodium Other (See Comments)    Current Outpatient Medications on File Prior to Visit  Medication Sig Dispense Refill   ALPRAZolam (XANAX) 1 MG tablet TAKE 1/2 TO 1 TABLET BY MOUTH TWO TIMES A DAY TO THREE TIMES A DAY AS NEEDED FOR ANXIETY 60 tablet 1   Ascorbic Acid (VITAMIN C PO) Take by mouth daily.     Cholecalciferol (VITAMIN D-3) 25 MCG (1000 UT) CAPS Take by mouth.     diclofenac Sodium (VOLTAREN) 1 % GEL Apply 2 g topically 4 (four) times daily. 100 g 1   dicyclomine (BENTYL) 10 MG  capsule Take 1 capsule (10 mg total) by mouth daily as needed. 30 capsule 0   Docusate Calcium (STOOL SOFTENER PO) Take by mouth as needed.     DULoxetine (CYMBALTA) 60 MG capsule Take 1 capsule (60 mg total) by mouth daily. 90 capsule 1   losartan (COZAAR) 50 MG  tablet Take 1 tablet (50 mg total) by mouth daily. 90 tablet 1   Multiple Vitamins-Minerals (MULTIVITAMIN WITH MINERALS) tablet Take 1 tablet by mouth daily.      pantoprazole (PROTONIX) 40 MG tablet Take 1 tablet (40 mg total) by mouth 2 (two) times daily. 180 tablet 0   Probiotic Product (ALIGN PO) Take by mouth daily.     simvastatin (ZOCOR) 20 MG tablet Take 1 tablet (20 mg total) by mouth at bedtime. 90 tablet 1   No current facility-administered medications on file prior to visit.    BP (!) 112/50   Pulse 62   Temp 98 F (36.7 C)   Ht 5\' 1"  (1.549 m)   Wt 120 lb 3.2 oz (54.5 kg)   LMP 06/16/1997   SpO2 98%   BMI 22.71 kg/m chart      Objective:    BP (!) 112/50   Pulse 62   Temp 98 F (36.7 C)   Ht 5\' 1"  (1.549 m)   Wt 120 lb 3.2 oz (54.5 kg)   LMP 06/16/1997   SpO2 98%   BMI 22.71 kg/m    Physical Exam Vitals and nursing note reviewed.  Constitutional:      Appearance: Normal appearance.  HENT:     Right Ear: Tympanic membrane and ear canal normal.     Left Ear: Tympanic membrane and ear canal normal.     Nose: Congestion and rhinorrhea present.  Cardiovascular:     Rate and Rhythm: Normal rate and regular rhythm.  Pulmonary:     Effort: Pulmonary effort is normal.     Breath sounds: Normal breath sounds. No wheezing.  Abdominal:     General: Abdomen is flat.     Palpations: Abdomen is soft.  Musculoskeletal:        General: Normal range of motion.     Cervical back: Normal range of motion and neck supple.  Skin:    General: Skin is warm and dry.  Neurological:     General: No focal deficit present.     Mental Status: She is alert and oriented to person, place, and time.  Psychiatric:         Mood and Affect: Mood normal.        Behavior: Behavior normal.   Results for orders placed or performed in visit on 12/17/21  POC COVID-19  Result Value Ref Range   SARS Coronavirus 2 Ag Negative Negative        Assessment & Plan:   Problem List Items Addressed This Visit   None Visit Diagnoses     Chest congestion    -  Primary   Relevant Orders   POC COVID-19 (Completed)   Viral upper respiratory illness           Meds ordered this encounter  Medications   methylPREDNISolone (MEDROL DOSEPAK) 4 MG TBPK tablet    Sig: As directed    Dispense:  21 tablet    Refill:  0   Call the office if symptoms worsen or persist. Recheck as scheduled and sooner as needed.  No follow-ups on file.  06/18/1997, FNP

## 2022-01-01 ENCOUNTER — Ambulatory Visit (INDEPENDENT_AMBULATORY_CARE_PROVIDER_SITE_OTHER): Payer: Medicare Other | Admitting: Family Medicine

## 2022-01-01 ENCOUNTER — Encounter: Payer: Self-pay | Admitting: Family Medicine

## 2022-01-01 VITALS — BP 120/48 | HR 57 | Temp 97.0°F | Resp 18 | Ht 61.0 in | Wt 118.4 lb

## 2022-01-01 DIAGNOSIS — F331 Major depressive disorder, recurrent, moderate: Secondary | ICD-10-CM

## 2022-01-01 DIAGNOSIS — I10 Essential (primary) hypertension: Secondary | ICD-10-CM

## 2022-01-01 MED ORDER — BUPROPION HCL ER (XL) 150 MG PO TB24: 150.0000 mg | ORAL_TABLET | Freq: Every day | ORAL | 3 refills | Status: DC

## 2022-01-01 NOTE — Patient Instructions (Signed)
Follow up in 4-6 weeks to recheck mood RESTART the Wellbutrin once daily CONTINUE the Cymbalta daily Stick w/ 1/2 tab of Losartan unless BP is higher than 140 and then take a 2nd half tab in the morning Call with any questions or concerns Stay Safe!  Stay Healthy! Happy Holidays!!

## 2022-01-01 NOTE — Progress Notes (Unsigned)
   Subjective:    Patient ID: Regina Gonzalez, female    DOB: [birth year], 76 y.o.   MRN: [id number]  HPI HTN- ongoing issue for pt.  At last visit we decreased Losartan to 1/2 tab daily to 25mg  daily.  BP remains low at 120/48.  She reports home BP's have been labile and up as high as 130s-140s so on those days she will take the extra 1/2 tab.  Denies CP, SOB, Has, visual changes, edema.  Has upcoming appt w/ Cards.  Depression- ongoing issue, pt is currently on Cymbalta 60mg  daily.  States she is having a hard time b/c this is grandson's last year at home (he is a ) and he is the only one home with her since husband and daughter died.  Feels that medication needs to be adjusted or changed.  Did get a dog which is helpful.   Review of Systems For ROS see HPI     Objective:   Physical Exam Vitals reviewed.  Constitutional:      General: She is not in acute distress.    Appearance: Normal appearance. She is well-developed. She is not ill-appearing.  HENT:     Head: Normocephalic and atraumatic.  Eyes:     Conjunctiva/sclera: Conjunctivae normal.     Pupils: Pupils are equal, round, and reactive to light.  Neck:     Thyroid: No thyromegaly.  Cardiovascular:     Rate and Rhythm: Normal rate and regular rhythm.     Pulses: Normal pulses.     Heart sounds: Normal heart sounds. No murmur heard. Pulmonary:     Effort: Pulmonary effort is normal. No respiratory distress.     Breath sounds: Normal breath sounds.  Abdominal:     General: There is no distension.     Palpations: Abdomen is soft.     Tenderness: There is no abdominal tenderness.  Musculoskeletal:     Cervical back: Normal range of motion and neck supple.     Right lower leg: No edema.     Left lower leg: No edema.  Lymphadenopathy:     Cervical: No cervical adenopathy.  Skin:    General: Skin is warm and dry.  Neurological:     General: No focal deficit present.     Mental Status: She is alert and  oriented to person, place, and time.  Psychiatric:        Behavior: Behavior normal.        Thought Content: Thought content normal.     Comments: Sad, tearful           Assessment & Plan:

## 2022-01-02 NOTE — Assessment & Plan Note (Signed)
Deteriorated.  Pt is having a hard time as it is her grandson's senior year.  He lives w/ her and she has raised him.  He is her closest family since her husband and daughter have died.  She is on Cymbalta 60mg  daily and I don't want to change that due to her Fibromyalgia, but we will add Wellbutrin 150mg  daily.  We have done this in the past and she has done well.  Prescription sent and will follow closely.  Pt expressed understanding and is in agreement w/ plan.

## 2022-01-02 NOTE — Assessment & Plan Note (Signed)
Ongoing issue.  Given pt's low DBP today encouraged her to continue Losartan at 1/2 tab daily.  She does have appt upcoming w/ cardiology.  I told her I would rather it run a bit high than too low.  She is not to take an additional 1/2 tab unless >140.  Pt expressed understanding and is in agreement w/ plan.

## 2022-01-23 ENCOUNTER — Encounter: Payer: Self-pay | Admitting: Family Medicine

## 2022-01-23 ENCOUNTER — Ambulatory Visit (INDEPENDENT_AMBULATORY_CARE_PROVIDER_SITE_OTHER): Payer: Medicare Other | Admitting: Family Medicine

## 2022-01-23 VITALS — BP 138/52 | HR 76 | Temp 98.8°F | Resp 16 | Ht 61.0 in | Wt 120.4 lb

## 2022-01-23 DIAGNOSIS — L299 Pruritus, unspecified: Secondary | ICD-10-CM

## 2022-01-23 MED ORDER — TRIAMCINOLONE ACETONIDE 0.1 % EX OINT: 1.0000 | TOPICAL_OINTMENT | Freq: Two times a day (BID) | CUTANEOUS | 1 refills | Status: AC

## 2022-01-23 NOTE — Patient Instructions (Addendum)
Follow up as needed or as scheduled If you develop the itch, have OTC Claritin or Zyrtec (Loratadine or Cetirizine) on hand to take Use the ointment as needed I suspect this is stress related Call with any questions or concerns Stay Safe!  Stay Healthy! Happy Holidays!!!

## 2022-01-23 NOTE — Progress Notes (Signed)
   Subjective:    Patient ID: Regina Gonzalez, female    DOB: 1945/06/09, 76 y.o.   MRN: [id number]  HPI Itching- R forearm.  Occurs episodically.  No relief w/ OTC itching cream.  No burning.  No recent injury to arm.  Sxs started ~10 days ago.  No visible rash or hives.  Is asymptomatic today.  In this time frame, grandson was in a hit and run, bank account was compromised, and best friend from HS just passed.   Review of Systems For ROS see HPI     Objective:   Physical Exam Vitals reviewed.  Constitutional:      General: She is not in acute distress.    Appearance: Normal appearance. She is not ill-appearing.  Cardiovascular:     Pulses: Normal pulses.  Skin:    General: Skin is warm and dry.     Findings: No erythema or rash.  Neurological:     General: No focal deficit present.     Mental Status: She is alert and oriented to person, place, and time.  Psychiatric:        Mood and Affect: Mood normal.        Behavior: Behavior normal.           Assessment & Plan:  Itching- new.  No evidence of rash or dry skin.  Currently asymptomatic.  Suspect this is a stress reaction.  Start antihistamine prn and topical triamcinolone ointment.  Pt expressed understanding and is in agreement w/ plan.

## 2022-01-24 ENCOUNTER — Ambulatory Visit: Payer: Medicare Other | Admitting: Student

## 2022-02-11 ENCOUNTER — Telehealth: Payer: Self-pay | Admitting: Family Medicine

## 2022-02-11 ENCOUNTER — Other Ambulatory Visit: Payer: Self-pay

## 2022-02-11 DIAGNOSIS — R002 Palpitations: Secondary | ICD-10-CM

## 2022-02-11 NOTE — Telephone Encounter (Signed)
Alprazolam 1 mg LOV: 01/06/22 Last Refill:09/01/21 Upcoming appt: no apt

## 2022-02-11 NOTE — Telephone Encounter (Signed)
error 

## 2022-02-12 ENCOUNTER — Ambulatory Visit (INDEPENDENT_AMBULATORY_CARE_PROVIDER_SITE_OTHER): Payer: Medicare Other | Admitting: Family Medicine

## 2022-02-12 ENCOUNTER — Encounter: Payer: Self-pay | Admitting: Family Medicine

## 2022-02-12 VITALS — BP 120/60 | HR 64 | Temp 97.7°F | Resp 16 | Ht 61.0 in | Wt 119.0 lb

## 2022-02-12 DIAGNOSIS — R0781 Pleurodynia: Secondary | ICD-10-CM | POA: Diagnosis not present

## 2022-02-12 MED ORDER — ALPRAZOLAM 1 MG PO TABS: ORAL_TABLET | ORAL | 1 refills | Status: DC

## 2022-02-12 NOTE — Telephone Encounter (Signed)
Notified pt that her Rx has been sent in  

## 2022-02-12 NOTE — Progress Notes (Signed)
   Subjective:    Patient ID: Regina Gonzalez, female    DOB: 16-Nov-1945, 77 y.o.   MRN: [id number]  HPI Rib pain- pt was reaching over sofa 'when I went too far'.  R sided.  Occurred a few days ago.  Some discomfort w/ taking a deep breath.  Dull ache at rest.  Pt reports sxs are improving w/ time.  Some TTP.     Review of Systems For ROS see HPI     Objective:   Physical Exam Vitals reviewed.  Constitutional:      General: She is not in acute distress.    Appearance: Normal appearance. She is not ill-appearing.  HENT:     Head: Normocephalic and atraumatic.  Pulmonary:     Effort: Pulmonary effort is normal. No respiratory distress.     Breath sounds: Normal breath sounds. No wheezing or rhonchi.  Chest:     Chest wall: Tenderness (mild TTP over R lower rib, no bruising or palpable deformity) present.  Skin:    General: Skin is warm and dry.     Findings: No bruising.  Neurological:     General: No focal deficit present.     Mental Status: She is alert and oriented to person, place, and time.  Psychiatric:        Mood and Affect: Mood normal.        Behavior: Behavior normal.        Thought Content: Thought content normal.           Assessment & Plan:  R rib pain- new.  No obvious deformity and only mildly TTP.  Suspect bruise rather than fx as pain is improving.  Reviewed supportive care and red flags that should prompt return.  Pt expressed understanding and is in agreement w/ plan.

## 2022-02-12 NOTE — Patient Instructions (Addendum)
Schedule your complete physical for May Use heat or ice to help w/ discomfort Tylenol as needed This should improve w/ time Call with any questions or concerns Stay Safe!  Stay Healthy! Happy New Year!!!

## 2022-02-13 ENCOUNTER — Ambulatory Visit: Payer: Medicare Other | Admitting: Student

## 2022-02-17 ENCOUNTER — Ambulatory Visit: Payer: Medicare Other | Admitting: Family Medicine

## 2022-02-26 NOTE — Progress Notes (Signed)
PCP:  Midge Minium, MD Primary Cardiologist: None Electrophysiologist: Cristopher Peru, MD   Regina Gonzalez is a 77 y.o. female seen today for Cristopher Peru, MD for routine electrophysiology followup. Since last being seen in our clinic the patient reports doing very well. She reports her fitbit reports her HR range as 70-130. She denies any symptoms of tachypalpitations, SOB, lightheadedness, or dizziness. No syncope. Has occasional fatigue. She remains active around the house, but has no set aside exercise.   Past Medical History:  Diagnosis Date   Allergic rhinitis    Anxiety    Arthritis    HANDS   Benign liver cyst    multiple   Chronic constipation    Depression    Diverticulosis of colon    Emphysematous COPD ([place])    External hemorrhoid    GERD (gastroesophageal reflux disease)    History of colon polyps    History of Helicobacter pylori infection    History of TIA (transient ischemic attack)    apr 2013-- no residual   Hyperlipidemia    Occipital neuralgia of left side 12/08/2014   Osteoporosis    Pancreatic cyst    simple benign   PONV (postoperative nausea and vomiting)    Prolapsed internal hemorrhoids, grade 3    Simple renal cyst    right    Wears glasses    Wears partial dentures    UPPER    Current Outpatient Medications  Medication Instructions   ALPRAZolam (XANAX) 1 MG tablet TAKE 1/2 TO 1 TABLET BY MOUTH TWO TIMES A DAY TO THREE TIMES A DAY AS NEEDED FOR ANXIETY   Ascorbic Acid (VITAMIN C PO) Oral, Daily   buPROPion (WELLBUTRIN XL) 150 mg, Oral, Daily   calcium carbonate (SUPER CALCIUM) 1500 (600 Ca) MG TABS tablet 2 tablets Orally Once a day   Cholecalciferol (VITAMIN D-3) 25 MCG (1000 UT) CAPS Oral   diclofenac Sodium (VOLTAREN) 2 g, Topical, 4 times daily   dicyclomine (BENTYL) 10 mg, Oral, Daily PRN   Docusate Calcium (STOOL SOFTENER PO) Oral, As needed   DULoxetine (CYMBALTA) 60 mg, Oral, Daily   linaclotide (LINZESS) 72 MCG  capsule 1 capsule on an empty stomach Orally once-twice a week as needed   losartan (COZAAR) 50 mg, Oral, Daily   Multiple Vitamins-Minerals (MULTIVITAMIN WITH MINERALS) tablet 1 tablet, Oral, Daily   pantoprazole (PROTONIX) 40 mg, Oral, 2 times daily   Probiotic Product (ALIGN PO) Oral, Daily   simethicone (GAS-X) 80 MG chewable tablet 4 times daily PRN   simvastatin (ZOCOR) 20 mg, Oral, Daily at bedtime   triamcinolone ointment (KENALOG) 0.1 % 1 Application, Topical, 2 times daily    Physical Exam: Vitals:   02/28/22 1021  BP: (!) 126/52  Pulse: 72  SpO2: 99%  Weight: 116 lb (52.6 kg)  Height: 5\' 1"  (1.549 m)    GEN- NAD. A&O x 3. Normal affect. HEENT: normocephalic, atraumatic Lungs- CTAB, Normal effort Heart- Regular rate and rhythm, No M/G/R Extremities- No peripheral edema. no clubbing or cyanosis; Skin- warm and dry, no rash or lesion  EKG is not ordered today.   Additional studies reviewed include: Previous EP notes.   LONG TERM MONITOR (8-14 DAYS) INTERPRETATION 10/08/2021 NSR with sinus brady and sinus tachycardia Rare and very brief episodes of NS AT Rare PAC's and PVC's (<1.0% each) No VT or sustained SVT (4 episodes, longest 8 beats) No atrial fib or prolonged pauses  Assessment and Plan:  1. Palpitations 2.  HR excursion by fitbit Monitor in the fall re-assuring.  She is asymptomatic currently and has no desire or indication for treatment.  Avoid triggers.   Follow up with EP as needed.   Shirley Friar, PA-C  02/28/22 10:29 AM

## 2022-02-28 ENCOUNTER — Encounter: Payer: Self-pay | Admitting: Student

## 2022-02-28 ENCOUNTER — Ambulatory Visit: Payer: Medicare Other | Attending: Student | Admitting: Student

## 2022-02-28 VITALS — BP 126/52 | HR 72 | Ht 61.0 in | Wt 116.0 lb

## 2022-02-28 DIAGNOSIS — R0602 Shortness of breath: Secondary | ICD-10-CM

## 2022-02-28 DIAGNOSIS — R002 Palpitations: Secondary | ICD-10-CM | POA: Diagnosis not present

## 2022-02-28 NOTE — Patient Instructions (Signed)
Medication Instructions:    Your physician recommends that you continue on your current medications as directed. Please refer to the Current Medication list given to you today.  *If you need a refill on your cardiac medications before your next appointment, please call your pharmacy*   Lab Work:    None ordered.  If you have labs (blood work) drawn today and your tests are completely normal, you will receive your results only by: [place] (if you have MyChart) OR A paper copy in the mail If you have any lab test that is abnormal or we need to change your treatment, we will call you to review the results.   Testing/Procedures:  None ordered.   Follow-Up: At [facility], you and your health needs are our priority.  As part of our continuing mission to provide you with exceptional heart care, we have created designated Provider Care Teams.  These Care Teams include your primary Cardiologist (physician) and Advanced Practice Providers (APPs -  Physician Assistants and Nurse Practitioners) who all work together to provide you with the care you need, when you need it.  We recommend signing up for the patient portal called "MyChart".  Sign up information is provided on this After Visit Summary.  MyChart is used to connect with patients for Virtual Visits (Telemedicine).  Patients are able to view lab/test results, encounter notes, upcoming appointments, etc.  Non-urgent messages can be sent to your provider as well.   To learn more about what you can do with MyChart, go to [email or web address].    Your next appointment:   AS needed

## 2022-03-19 ENCOUNTER — Other Ambulatory Visit: Payer: Self-pay | Admitting: Family Medicine

## 2022-03-19 DIAGNOSIS — Z1231 Encounter for screening mammogram for malignant neoplasm of breast: Secondary | ICD-10-CM

## 2022-04-04 ENCOUNTER — Other Ambulatory Visit: Payer: Self-pay

## 2022-04-04 DIAGNOSIS — I10 Essential (primary) hypertension: Secondary | ICD-10-CM

## 2022-04-04 MED ORDER — LOSARTAN POTASSIUM 50 MG PO TABS: 50.0000 mg | ORAL_TABLET | Freq: Every day | ORAL | 1 refills | Status: DC

## 2022-04-08 ENCOUNTER — Other Ambulatory Visit: Payer: Self-pay

## 2022-04-08 DIAGNOSIS — I10 Essential (primary) hypertension: Secondary | ICD-10-CM

## 2022-04-08 MED ORDER — LOSARTAN POTASSIUM 50 MG PO TABS: 50.0000 mg | ORAL_TABLET | Freq: Every day | ORAL | 1 refills | Status: AC

## 2022-05-03 ENCOUNTER — Emergency Department (HOSPITAL_BASED_OUTPATIENT_CLINIC_OR_DEPARTMENT_OTHER): Payer: Medicare Other

## 2022-05-03 ENCOUNTER — Encounter (HOSPITAL_BASED_OUTPATIENT_CLINIC_OR_DEPARTMENT_OTHER): Payer: Self-pay | Admitting: Emergency Medicine

## 2022-05-03 ENCOUNTER — Emergency Department (HOSPITAL_BASED_OUTPATIENT_CLINIC_OR_DEPARTMENT_OTHER)
Admission: EM | Admit: 2022-05-03 | Discharge: 2022-05-03 | Disposition: A | Discharge status: Home / Self Care | Payer: Medicare Other | Attending: Emergency Medicine | Admitting: Emergency Medicine

## 2022-05-03 DIAGNOSIS — Z7901 Long term (current) use of anticoagulants: Secondary | ICD-10-CM | POA: Insufficient documentation

## 2022-05-03 DIAGNOSIS — M79661 Pain in right lower leg: Secondary | ICD-10-CM | POA: Insufficient documentation

## 2022-05-03 NOTE — ED Triage Notes (Signed)
Right calf pain and swelling. X 5 days, seen at clinic concern for blood clot

## 2022-05-03 NOTE — ED Notes (Signed)
Discharge instructions provided by edp were discussed with pt. Pt verbalized understanding with no additional questions at this time. Pt to go home with grandson

## 2022-05-03 NOTE — ED Provider Notes (Signed)
[facility] Provider Note   CSN: [id number] Arrival date & time: 05/03/22  [id number]     History  Chief Complaint  Patient presents with   Leg Swelling    Regina Gonzalez is a 77 y.o. female presenting today with 4 to 5 days worth of right leg pain and swelling.  Localizes it to her calf.  No history of DVT, recent travel or surgery.  No OCP or tobacco use.  Also no fevers or chills or rash complaints.  She was seen by her PCP at [place] who sent her here for an ultrasound study.  They also gave her 2 doses of Eliquis just in case she had a DVT.  HPI     Home Medications Prior to Admission medications   Medication Sig Start Date End Date Taking? Authorizing Provider  ALPRAZolam (XANAX) 1 MG tablet TAKE 1/2 TO 1 TABLET BY MOUTH TWO TIMES A DAY TO THREE TIMES A DAY AS NEEDED FOR ANXIETY 02/12/22   Midge Minium, MD  Ascorbic Acid (VITAMIN C PO) Take by mouth daily.    [provider]  buPROPion (WELLBUTRIN XL) 150 MG 24 hr tablet Take 1 tablet (150 mg total) by mouth daily. Patient not taking: Reported on 02/28/2022 01/01/22   Midge Minium, MD  calcium carbonate (SUPER CALCIUM) 1500 (600 Ca) MG TABS tablet 2 tablets Orally Once a day    [provider]  Cholecalciferol (VITAMIN D-3) 25 MCG (1000 UT) CAPS Take by mouth.    [provider]  diclofenac Sodium (VOLTAREN) 1 % GEL Apply 2 g topically 4 (four) times daily. 12/07/19   Brunetta Jeans, PA-C  dicyclomine (BENTYL) 10 MG capsule Take 1 capsule (10 mg total) by mouth daily as needed. 02/22/21   Midge Minium, MD  Docusate Calcium (STOOL SOFTENER PO) Take by mouth as needed.    [provider]  DULoxetine (CYMBALTA) 60 MG capsule Take 1 capsule (60 mg total) by mouth daily. 12/06/21   Midge Minium, MD  linaclotide Rolan Lipa) 72 MCG capsule 1 capsule on an empty stomach Orally once-twice a week as needed 11/13/17    [provider]  losartan (COZAAR) 50 MG tablet Take 1 tablet (50 mg total) by mouth daily. 04/08/22   Midge Minium, MD  Multiple Vitamins-Minerals (MULTIVITAMIN WITH MINERALS) tablet Take 1 tablet by mouth daily.     [provider]  pantoprazole (PROTONIX) 40 MG tablet Take 1 tablet (40 mg total) by mouth 2 (two) times daily. 02/18/21   Midge Minium, MD  Probiotic Product (ALIGN PO) Take by mouth daily.    [provider]  simethicone (GAS-X) 80 MG chewable tablet 4 (four) times daily as needed.    [provider]  simvastatin (ZOCOR) 20 MG tablet Take 1 tablet (20 mg total) by mouth at bedtime. 06/27/21   Midge Minium, MD  triamcinolone ointment (KENALOG) 0.1 % Apply 1 Application topically 2 (two) times daily. 01/23/22 01/23/23  Midge Minium, MD      Allergies    Codeine, Erythromycin, Metronidazole, Alendronate sodium, Doxycycline, and Macrolides and ketolides    Review of Systems   Review of Systems  Physical Exam Updated Vital Signs BP (!) 154/57 (BP Location: Right Arm)   Pulse 76   Temp 98.3 F (36.8 C) (Oral)   Resp 18   Ht 5\' 1"  (1.549 m)   Wt 53.1 kg   LMP 06/16/1997  SpO2 100%   BMI 22.11 kg/m  Physical Exam Vitals and nursing note reviewed.  Constitutional:      Appearance: Normal appearance.  HENT:     Head: Normocephalic and atraumatic.  Eyes:     General: No scleral icterus.    Conjunctiva/sclera: Conjunctivae normal.  Pulmonary:     Effort: Pulmonary effort is normal. No respiratory distress.  Musculoskeletal:     Comments: Very mild swelling of the right gastrocs when compared to the left.  No tension.  Varicose veins throughout the right lower extremity.  Strong DP pulses bilaterally.  No overlying skin changes or cellulitis.  No venous stasis  Skin:    Findings: No rash.  Neurological:     Mental Status: She is alert.  Psychiatric:        Mood and Affect: Mood normal.     ED Results /  Procedures / Treatments   Labs (all labs ordered are listed, but only abnormal results are displayed) Labs Reviewed - No data to display  EKG None  Radiology US Venous Img Lower Unilateral Right  Result Date: 05/03/2022 CLINICAL DATA:  Right calf pain and swelling for 5 days. EXAM: RIGHT LOWER EXTREMITY VENOUS DOPPLER ULTRASOUND TECHNIQUE: Gray-scale sonography with compression, as well as color and duplex ultrasound, were performed to evaluate the deep venous system(s) from the level of the common femoral vein through the popliteal and proximal calf veins. COMPARISON:  None Available. FINDINGS: VENOUS Normal compressibility of the common femoral, superficial femoral, and popliteal veins, as well as the visualized calf veins. Visualized portions of profunda femoral vein and great saphenous vein unremarkable. No filling defects to suggest DVT on grayscale or color Doppler imaging. Doppler waveforms show normal direction of venous flow, normal respiratory plasticity and response to augmentation. Limited views of the contralateral common femoral vein are unremarkable. OTHER None. Limitations: none IMPRESSION: Negative. Electronically Signed   By: Brett Fairy M.D.   On: 05/03/2022 20:24    Procedures Procedures   Medications Ordered in ED Medications - No data to display  ED Course/ Medical Decision Making/ A&P Clinical Course as of 05/03/22 2042  Sat May 03, 2022  2030 US Venous Img Lower Unilateral Right [MR]    Clinical Course User Index [MR] Rhae Hammock, PA-C                             Medical Decision Making Amount and/or Complexity of Data Reviewed Radiology:  Decision-making details documented in ED Course.   77 year old female presenting today with right leg pain and swelling.  Differential includes but is not limited to DVT, PAD, cellulitis, peripheral edema/fluid overload.  This is not exhaustive.  Physical exam: Very minor swelling of the right calf.  No tension.   Strong DP pulse.  No skin changes however a moderate amount of varicose veins present.  TTP over the mid gastrocs  Imaging: DVT study ordered in triage.  I viewed and interpreted this and agree that there are no acute findings.  MDM/disposition: 77 year old female presenting today with concern for DVT by PCP.  Ultrasound negative for DVT.  She has no overlying skin changes and I do not believe this is cellulitis.  No signs of venous stasis.  She does report that the pain is worse when she spends a lot of time on her feet or ambulates long distances, potentially some degree of claudication however no emergent condition requiring intervention at this time  given her strong DP pulses.  She will be discharged with PCP follow-up, she is agreeable to this.  She will use Tylenol and ibuprofen for any discomfort and will not take any more Eliquis  Final Clinical Impression(s) / ED Diagnoses Final diagnoses:  Right calf pain    Rx / DC Orders ED Discharge Orders     None      Results and diagnoses were explained to the patient. Return precautions discussed in full. Patient had no additional questions and expressed complete understanding.   This chart was dictated using voice recognition software.  Despite best efforts to proofread,  errors can occur which can change the documentation meaning.    Darliss Ridgel 05/03/22 2113    Tretha Sciara, MD 05/04/22 1135

## 2022-05-03 NOTE — Discharge Instructions (Signed)
As we discussed, you do not have a blood clot.  You may follow-up with your PCP next week if you continue to have discomfort.  Tylenol and ibuprofen are reasonable choices for your pain you may use ice packs for swelling and discomfort as well.  Do not hesitate to return with any further concerns.  It was a pleasure to meet you and we hope you feel better!

## 2022-05-08 ENCOUNTER — Emergency Department (HOSPITAL_COMMUNITY)
Admission: EM | Admit: 2022-05-08 | Discharge: 2022-05-08 | Disposition: A | Discharge status: Home / Self Care | Payer: Medicare Other | Attending: Emergency Medicine | Admitting: Emergency Medicine

## 2022-05-08 ENCOUNTER — Encounter (HOSPITAL_COMMUNITY): Payer: Self-pay | Admitting: Pharmacy Technician

## 2022-05-08 ENCOUNTER — Other Ambulatory Visit: Payer: Self-pay

## 2022-05-08 DIAGNOSIS — M7989 Other specified soft tissue disorders: Secondary | ICD-10-CM | POA: Diagnosis present

## 2022-05-08 NOTE — ED Notes (Signed)
Patient Alert and oriented to baseline. Stable and ambulatory to baseline. Patient verbalized understanding of the discharge instructions.  Patient belongings were taken by the patient.   

## 2022-05-08 NOTE — ED Provider Notes (Signed)
[facility] Provider Note   CSN: [id number] Arrival date & time: 05/08/22  1519     History  Chief Complaint  Patient presents with   Leg Swelling    Regina Gonzalez is a 77 y.o. female who presents to the ED complaining of an area of swelling to the right lower leg.  She noticed this about a week and a half ago.  Her primary care ordered an outpatient ultrasound and x-ray which were normal.  She has a follow-up appointment with her primary care tomorrow.  She denies chest pain, shortness of breath, nausea, vomiting, diarrhea, palpitations, lightheadedness, dizziness, syncope, or other complaints.  She denies any fall or injury to the leg.  States that the pain started in the posterior calf and is now to the medial ankle.  No history of these problems.  No history of DVT, heart failure, vascular insufficiency, or other types of edema.      Home Medications Prior to Admission medications   Medication Sig Start Date End Date Taking? Authorizing Provider  ALPRAZolam (XANAX) 1 MG tablet TAKE 1/2 TO 1 TABLET BY MOUTH TWO TIMES A DAY TO THREE TIMES A DAY AS NEEDED FOR ANXIETY 02/12/22   Midge Minium, MD  Ascorbic Acid (VITAMIN C PO) Take by mouth daily.    [provider]  buPROPion (WELLBUTRIN XL) 150 MG 24 hr tablet Take 1 tablet (150 mg total) by mouth daily. Patient not taking: Reported on 02/28/2022 01/01/22   Midge Minium, MD  calcium carbonate (SUPER CALCIUM) 1500 (600 Ca) MG TABS tablet 2 tablets Orally Once a day    [provider]  Cholecalciferol (VITAMIN D-3) 25 MCG (1000 UT) CAPS Take by mouth.    [provider]  diclofenac Sodium (VOLTAREN) 1 % GEL Apply 2 g topically 4 (four) times daily. 12/07/19   Brunetta Jeans, PA-C  dicyclomine (BENTYL) 10 MG capsule Take 1 capsule (10 mg total) by mouth daily as needed. 02/22/21   Midge Minium, MD  Docusate Calcium (STOOL SOFTENER PO)  Take by mouth as needed.    [provider]  DULoxetine (CYMBALTA) 60 MG capsule Take 1 capsule (60 mg total) by mouth daily. 12/06/21   Midge Minium, MD  linaclotide Rolan Lipa) 72 MCG capsule 1 capsule on an empty stomach Orally once-twice a week as needed 11/13/17   [provider]  losartan (COZAAR) 50 MG tablet Take 1 tablet (50 mg total) by mouth daily. 04/08/22   Midge Minium, MD  Multiple Vitamins-Minerals (MULTIVITAMIN WITH MINERALS) tablet Take 1 tablet by mouth daily.     [provider]  pantoprazole (PROTONIX) 40 MG tablet Take 1 tablet (40 mg total) by mouth 2 (two) times daily. 02/18/21   Midge Minium, MD  Probiotic Product (ALIGN PO) Take by mouth daily.    [provider]  simethicone (GAS-X) 80 MG chewable tablet 4 (four) times daily as needed.    [provider]  simvastatin (ZOCOR) 20 MG tablet Take 1 tablet (20 mg total) by mouth at bedtime. 06/27/21   Midge Minium, MD  triamcinolone ointment (KENALOG) 0.1 % Apply 1 Application topically 2 (two) times daily. 01/23/22 01/23/23  Midge Minium, MD      Allergies    Codeine, Erythromycin, Metronidazole, Alendronate sodium, Doxycycline, and Macrolides and ketolides    Review of Systems   Review of Systems  All other systems reviewed and are negative.  Physical Exam Updated Vital Signs BP (!) 147/53 (BP Location: Left Arm)   Pulse 66   Temp 98.3 F (36.8 C) (Oral)   Resp 16   LMP 06/16/1997   SpO2 100%  Physical Exam Vitals and nursing note reviewed.  Constitutional:      General: She is not in acute distress.    Appearance: Normal appearance. She is not ill-appearing, toxic-appearing or diaphoretic.  HENT:     Head: Normocephalic and atraumatic.     Mouth/Throat:     Mouth: Mucous membranes are moist.  Eyes:     Extraocular Movements: Extraocular movements intact.     Conjunctiva/sclera: Conjunctivae normal.     Pupils: Pupils are equal,  round, and reactive to light.  Cardiovascular:     Rate and Rhythm: Normal rate and regular rhythm.     Heart sounds: No murmur heard. Pulmonary:     Effort: Pulmonary effort is normal. No respiratory distress.     Breath sounds: Normal breath sounds. No stridor. No wheezing, rhonchi or rales.  Abdominal:     General: Abdomen is flat. There is no distension.     Palpations: Abdomen is soft.     Tenderness: There is no abdominal tenderness. There is no guarding or rebound.  Musculoskeletal:        General: Normal range of motion.     Cervical back: Normal range of motion and neck supple.     Comments: Minimal amounts of trace nonpitting pedal edema on the right, small oblong area of swelling over the medial malleolus, no overlying erythema, wounds, increased warmth, or other skin changes, no significant tenderness to any aspect of the right lower extremity, 2+ DP and PT pulses, soft compartments, normal sensation, range of motion intact, negative Homan sign and negative Thompson test  Skin:    General: Skin is warm and dry.     Capillary Refill: Capillary refill takes less than 2 seconds.  Neurological:     Mental Status: She is alert. Mental status is at baseline.  Psychiatric:        Behavior: Behavior normal.     ED Results / Procedures / Treatments   Labs (all labs ordered are listed, but only abnormal results are displayed) Labs Reviewed - No data to display  EKG None  Radiology No results found.  Procedures Procedures    Medications Ordered in ED Medications - No data to display  ED Course/ Medical Decision Making/ A&P                             Medical Decision Making  Medical Decision Making:   Regina Gonzalez is a 77 y.o. female who presented to the ED today with right leg swelling detailed above.    Patient's presentation is complicated by their history of advanced age, anxiety, depression, TIA, hyperlipidemia.  Complete initial physical exam  performed, notably the patient  was in no acute distress. Minimal amounts of trace nonpitting pedal edema on the right, small oblong area of swelling over the medial malleolus, no overlying erythema, wounds, increased warmth, or other skin changes, no significant tenderness to any aspect of the right lower extremity, 2+ DP and PT pulses, soft compartments, normal sensation, range of motion intact.    Reviewed and confirmed nursing documentation for past medical history, family history, social history.    Initial Assessment:   With the patient's presentation of right leg swelling, differential diagnosis includes  is not limited to DVT, fracture, dislocation, calf strain, tendinitis, Achilles tendon rupture, strain, sprain, vascular insufficiency, cellulitis.   Initial Plan:  Objective evaluation as reviewed   Initial Study Results:   Radiology:  All images reviewed independently. Agree with radiology report at this time.   US Venous Img Lower Unilateral Right  Result Date: 05/03/2022 CLINICAL DATA:  Right calf pain and swelling for 5 days. EXAM: RIGHT LOWER EXTREMITY VENOUS DOPPLER ULTRASOUND TECHNIQUE: Gray-scale sonography with compression, as well as color and duplex ultrasound, were performed to evaluate the deep venous system(s) from the level of the common femoral vein through the popliteal and proximal calf veins. COMPARISON:  None Available. FINDINGS: VENOUS Normal compressibility of the common femoral, superficial femoral, and popliteal veins, as well as the visualized calf veins. Visualized portions of profunda femoral vein and great saphenous vein unremarkable. No filling defects to suggest DVT on grayscale or color Doppler imaging. Doppler waveforms show normal direction of venous flow, normal respiratory plasticity and response to augmentation. Limited views of the contralateral common femoral vein are unremarkable. OTHER None. Limitations: none IMPRESSION: Negative. Electronically Signed    By: Brett Fairy M.D.   On: 05/03/2022 20:24     Final Assessment and Plan:   This is a 77 year old female who presents to the ED complaining of leg swelling.  Patient has been evaluated by her primary care for this and had a negative DVT study as well as a negative x-ray.  She does have an area of swelling over the medial malleolus.  She states that pain started in the posterior calf and is now located around the medial ankle.  She is neurovascularly intact.  She has a small area of swelling over the medial malleolus.  Skin is warm and appears to be well-perfused.  No overlying skin changes or wounds.  Compartments are soft.  Range of motion intact.  Minimal amounts of nonpitting pedal edema.  No edema to the left leg.  Patient without chest pain, shortness of breath, palpitations.  Lungs clear to auscultation.  She is slightly hypertensive but has a normal oxygen saturation, no increased respiratory effort.  No history of heart failure.  Overall patient is well-appearing.  Patient has a follow-up appointment with her primary care tomorrow.  She does not appear to have an overlying cellulitis.  No history of diabetes.  No wounds.  Overall, I do think patient has so far had an adequate outpatient workup and there is not much to offer her from an ED perspective given her reassuring physical exam and vital signs.  She is requesting Lasix prescription.  I believe that it would be best this comes from her primary care so they can monitor her for side effects, electrolyte abnormalities, etc.  Discussed this with patient.  With this, patient will proceed to follow-up with her primary care provider tomorrow and set up stay in the ED.  Patient given strict ED return precautions, all questions answered, and stable for discharge   Clinical Impression:  1. Right leg swelling      Discharge           Final Clinical Impression(s) / ED Diagnoses Final diagnoses:  Right leg swelling    Rx / DC Orders ED  Discharge Orders     None         Turner Daniels 05/08/22 2034    Tretha Sciara, MD 05/08/22 2134

## 2022-05-08 NOTE — ED Triage Notes (Signed)
Pt here with reports of R calf pain and swelling onset 10 days ago. Seen for same and had US done to rule out DVT. Pt states pain has continued and now she has swelling in her ankle. Denies known injury.

## 2022-05-08 NOTE — Discharge Instructions (Signed)
Please go to your primary care appointment scheduled for tomorrow.  If you develop any new or worsening symptoms such as chest pain, shortness of breath, lightheadedness, dizziness, loss of consciousness, worsening swelling, redness to the area, or other new concerning symptoms, please return to the nearest emergency department for reevaluation.

## 2022-05-09 ENCOUNTER — Ambulatory Visit (INDEPENDENT_AMBULATORY_CARE_PROVIDER_SITE_OTHER): Payer: Medicare Other | Admitting: Family Medicine

## 2022-05-09 ENCOUNTER — Encounter: Payer: Self-pay | Admitting: Family Medicine

## 2022-05-09 VITALS — BP 122/58 | HR 66 | Temp 98.6°F | Resp 16 | Ht 61.0 in | Wt 117.5 lb

## 2022-05-09 DIAGNOSIS — M7751 Other enthesopathy of right foot: Secondary | ICD-10-CM

## 2022-05-09 DIAGNOSIS — S86811A Strain of other muscle(s) and tendon(s) at lower leg level, right leg, initial encounter: Secondary | ICD-10-CM

## 2022-05-09 MED ORDER — MELOXICAM 15 MG PO TABS: 15.0000 mg | ORAL_TABLET | Freq: Every day | ORAL | 0 refills | Status: AC

## 2022-05-09 NOTE — Patient Instructions (Signed)
Follow up as needed or as scheduled START the once daily Meloxicam- take w/ food ADD Acetaminophen (Tylenol) as needed for additional pain relief ICE, ELEVATION Use either a compression sock or an ace bandage to provide support around the calf Call with any questions or concerns- particularly if not improving Hang in there!

## 2022-05-09 NOTE — Progress Notes (Signed)
   Subjective:    Patient ID: Regina Gonzalez, female    DOB: February 11, 1945, 77 y.o.   MRN: [id number]  HPI Leg swelling- pt has hx of previous.  Had negative venous doppler on 3/30.  Went to ER for R leg pain and swelling yesterday.  Swelling was localized to R medial malleolus.  Pt reports she is pain free at rest, develops pain in calf w/ walking.   Review of Systems For ROS see HPI     Objective:   Physical Exam Vitals reviewed.  Constitutional:      General: She is not in acute distress.    Appearance: Normal appearance. She is not ill-appearing.  HENT:     Head: Normocephalic and atraumatic.  Eyes:     Extraocular Movements: Extraocular movements intact.     Conjunctiva/sclera: Conjunctivae normal.     Pupils: Pupils are equal, round, and reactive to light.  Cardiovascular:     Rate and Rhythm: Normal rate and regular rhythm.     Pulses: Normal pulses.  Pulmonary:     Effort: Pulmonary effort is normal. No respiratory distress.  Musculoskeletal:        General: Tenderness (TTP over R posterior calf) present.     Right lower leg: Edema (localized swelling of R medial malleolus and generalized, nonpitting swelling of R lower leg) present.     Left lower leg: No edema.  Skin:    General: Skin is warm and dry.     Findings: No erythema.  Neurological:     General: No focal deficit present.     Mental Status: She is alert and oriented to person, place, and time.  Psychiatric:        Mood and Affect: Mood normal.        Behavior: Behavior normal.        Thought Content: Thought content normal.           Assessment & Plan:  Calf strain- new.  Reviewed previous notes, imaging.  No evidence of DVT.  Pt w/ superficial rather than deep TTP.  No palpable cord.  No erythema.  R calf is slightly bigger than left circumferentially.  Suspect a calf strain due to pain w/ weight bearing/movement and none at rest.  Start daily NSAID.  Encouraged rest, elevation, ice,  compression.  Pt expressed understanding and is in agreement w/ plan.   Ankle bursitis- new.  Pt w/ localized swelling over medial malleolus bursa.  Minimal TTP.  Discussed that she likely changed her gait due to calf pain which caused localized inflammation.  Reviewed that pain and swelling will improve w/ Meloxicam and rest.  Pt expressed understanding and is in agreement w/ plan.

## 2022-06-13 ENCOUNTER — Encounter: Payer: Medicare Other | Admitting: Family Medicine

## 2022-06-16 ENCOUNTER — Ambulatory Visit (INDEPENDENT_AMBULATORY_CARE_PROVIDER_SITE_OTHER): Payer: Medicare Other | Admitting: Family Medicine

## 2022-06-16 ENCOUNTER — Encounter: Payer: Self-pay | Admitting: Family Medicine

## 2022-06-16 VITALS — BP 128/60 | HR 72 | Temp 97.9°F | Resp 17 | Ht 61.0 in | Wt 118.2 lb

## 2022-06-16 DIAGNOSIS — Z0000 Encounter for general adult medical examination without abnormal findings: Secondary | ICD-10-CM

## 2022-06-16 DIAGNOSIS — E7800 Pure hypercholesterolemia, unspecified: Secondary | ICD-10-CM

## 2022-06-16 DIAGNOSIS — R002 Palpitations: Secondary | ICD-10-CM

## 2022-06-16 DIAGNOSIS — M810 Age-related osteoporosis without current pathological fracture: Secondary | ICD-10-CM

## 2022-06-16 LAB — HEPATIC FUNCTION PANEL
ALT: 16 U/L (ref 0–35)
AST: 20 U/L (ref 0–37)
Albumin: 4.1 g/dL (ref 3.5–5.2)
Alkaline Phosphatase: 55 U/L (ref 39–117)
Bilirubin, Direct: 0.1 mg/dL (ref 0.0–0.3)
Total Bilirubin: 0.4 mg/dL (ref 0.2–1.2)
Total Protein: 6.4 g/dL (ref 6.0–8.3)

## 2022-06-16 LAB — TSH: TSH: 1.49 u[IU]/mL (ref 0.35–5.50)

## 2022-06-16 LAB — BASIC METABOLIC PANEL
BUN: 9 mg/dL (ref 6–23)
CO2: 31 mEq/L (ref 19–32)
Calcium: 9.6 mg/dL (ref 8.4–10.5)
Chloride: 101 mEq/L (ref 96–112)
Creatinine, Ser: 1 mg/dL (ref 0.40–1.20)
GFR: 55.35 mL/min — ABNORMAL LOW (ref >60.00)
Glucose, Bld: 64 mg/dL — ABNORMAL LOW (ref 70–99)
Potassium: 3.8 mEq/L (ref 3.5–5.1)
Sodium: 139 mEq/L (ref 135–145)

## 2022-06-16 LAB — VITAMIN D 25 HYDROXY (VIT D DEFICIENCY, FRACTURES): VITD: 57.58 ng/mL (ref 30.00–100.00)

## 2022-06-16 LAB — CBC WITH DIFFERENTIAL/PLATELET
Basophils Absolute: 0.1 10*3/uL (ref 0.0–0.1)
Basophils Relative: 1 % (ref 0.0–3.0)
Eosinophils Absolute: 0.2 10*3/uL (ref 0.0–0.7)
Eosinophils Relative: 3.8 % (ref 0.0–5.0)
HCT: 37.8 % (ref 36.0–46.0)
Hemoglobin: 12.8 g/dL (ref 12.0–15.0)
Lymphocytes Relative: 34.8 % (ref 12.0–46.0)
Lymphs Abs: 2 10*3/uL (ref 0.7–4.0)
MCHC: 33.9 g/dL (ref 30.0–36.0)
MCV: 91.7 fl (ref 78.0–100.0)
Monocytes Absolute: 0.6 10*3/uL (ref 0.1–1.0)
Monocytes Relative: 10.3 % (ref 3.0–12.0)
Neutro Abs: 2.9 10*3/uL (ref 1.4–7.7)
Neutrophils Relative %: 50.1 % (ref 43.0–77.0)
Platelets: 301 10*3/uL (ref 150.0–400.0)
RBC: 4.12 Mil/uL (ref 3.87–5.11)
RDW: 15.7 % — ABNORMAL HIGH (ref 11.5–15.5)
WBC: 5.8 10*3/uL (ref 4.0–10.5)

## 2022-06-16 LAB — LIPID PANEL
Cholesterol: 233 mg/dL — ABNORMAL HIGH (ref 0–200)
HDL: 84.4 mg/dL (ref >39.00)
LDL Cholesterol: 138 mg/dL — ABNORMAL HIGH (ref 0–99)
NonHDL: 148.71
Total CHOL/HDL Ratio: 3
Triglycerides: 52 mg/dL (ref 0.0–149.0)
VLDL: 10.4 mg/dL (ref 0.0–40.0)

## 2022-06-16 MED ORDER — DULOXETINE HCL 60 MG PO CPEP: 60.0000 mg | ORAL_CAPSULE | Freq: Every day | ORAL | 1 refills | Status: AC

## 2022-06-16 MED ORDER — ALPRAZOLAM 1 MG PO TABS: ORAL_TABLET | ORAL | 1 refills | Status: AC

## 2022-06-16 MED ORDER — BUPROPION HCL ER (XL) 150 MG PO TB24: 150.0000 mg | ORAL_TABLET | Freq: Every day | ORAL | 1 refills | Status: AC

## 2022-06-16 MED ORDER — SIMVASTATIN 20 MG PO TABS: 20.0000 mg | ORAL_TABLET | Freq: Every day | ORAL | 1 refills | Status: DC

## 2022-06-16 NOTE — Patient Instructions (Signed)
Follow up in 6 months to recheck BP and cholesterol We'll notify you of your lab results and make any changes if needed Keep up the good work!  You look great!! Call with any questions or concerns Stay Safe!  Stay Healthy! ENJOY GRADUATION!!!

## 2022-06-16 NOTE — Assessment & Plan Note (Signed)
Check labs and adjust medication as needed.

## 2022-06-16 NOTE — Assessment & Plan Note (Signed)
Pt's PE WNL.  UTD on mammo, colonoscopy, immunizations, DEXA.  Check labs.  Anticipatory guidance provided.  

## 2022-06-16 NOTE — Assessment & Plan Note (Signed)
UTD on DEXA.  Check Vit D and replete prn. 

## 2022-06-16 NOTE — Progress Notes (Signed)
   Subjective:    Patient ID: Regina Gonzalez, female    DOB: 1945-08-13, 77 y.o.   MRN: [id number]  HPI CPE- UTD on mammo, DEXA, colonoscopy, Tdap, PNA  Patient Care Team    Relationship Specialty Notifications Start End  Sheliah Hatch, MD PCP - General   01/09/10    Comment: Lilia Argue, Doylene Canning, MD PCP - Electrophysiology Cardiology  01/22/21   Willis Modena, MD Consulting Physician Gastroenterology  09/15/14   Celso Amy, PA-C Physician Assistant Gastroenterology  05/09/19   [facility] [place]  Optometry  09/26/21    Comment: Seth Bake    Health Maintenance  Topic Date Due   MAMMOGRAM  06/20/2022   INFLUENZA VACCINE  09/04/2022   Medicare Annual Wellness (AWV)  09/27/2022   DEXA SCAN  07/03/2023   COLONOSCOPY (Pts 45-26yrs Insurance coverage will need to be confirmed)  06/22/2029   DTaP/Tdap/Td (3 - Td or Tdap) 12/23/2029   Pneumonia Vaccine 15+ Years old  Completed   Hepatitis C Screening  Completed   Zoster Vaccines- Shingrix  Completed   HPV VACCINES  Aged Out   COVID-19 Vaccine  Discontinued      Review of Systems Patient reports no vision/ hearing changes, adenopathy,fever, weight change,  persistant/recurrent hoarseness , swallowing issues, chest pain, palpitations, edema, persistant/recurrent cough, hemoptysis, dyspnea (rest/exertional/paroxysmal nocturnal), gastrointestinal bleeding (melena, rectal bleeding), abdominal pain, significant heartburn, bowel changes, GU symptoms (dysuria, hematuria, incontinence), Gyn symptoms (abnormal  bleeding, pain),  syncope, focal weakness, memory loss, numbness & tingling, skin/hair/nail changes, abnormal bruising or bleeding, anxiety, or depression.     Objective:   Physical Exam General Appearance:    Alert, cooperative, no distress, appears stated age  Head:    Normocephalic, without obvious abnormality, atraumatic  Eyes:    PERRL, conjunctiva/corneas clear, EOM's intact both eyes  Ears:    Normal  TM's and external ear canals, both ears  Nose:   Nares normal, septum midline, mucosa normal, no drainage    or sinus tenderness  Throat:   Lips, mucosa, and tongue normal; teeth and gums normal  Neck:   Supple, symmetrical, trachea midline, no adenopathy;    Thyroid: no enlargement/tenderness/nodules  Back:     Symmetric, no curvature, ROM normal, no CVA tenderness  Lungs:     Clear to auscultation bilaterally, respirations unlabored  Chest Wall:    No tenderness or deformity   Heart:    Regular rate and rhythm, S1 and S2 normal, no murmur, rub   or gallop  Breast Exam:    Deferred to GYN  Abdomen:     Soft, non-tender, bowel sounds active all four quadrants,    no masses, no organomegaly  Genitalia:    Deferred to GYN  Rectal:    Extremities:   Extremities normal, atraumatic, no cyanosis or edema  Pulses:   2+ and symmetric all extremities  Skin:   Skin color, texture, turgor normal, no rashes or lesions  Lymph nodes:   Cervical, supraclavicular, and axillary nodes normal  Neurologic:   CNII-XII intact, normal strength, sensation and reflexes    throughout          Assessment & Plan:

## 2022-06-17 ENCOUNTER — Telehealth: Payer: Self-pay

## 2022-06-17 NOTE — Telephone Encounter (Signed)
Pt aware of lab results and 6 month f/u apt has been made

## 2022-06-17 NOTE — Telephone Encounter (Signed)
-----   Message from Sheliah Hatch, MD sent at 06/17/2022  7:29 AM EDT ----- Labs look good w/ exception of total cholesterol and LDL (bad cholesterol) which have both jumped considerably since last check.  Please make sure you are taking your Simvastatin daily and we'll repeat labs in 6 months

## 2022-06-23 ENCOUNTER — Ambulatory Visit: Payer: Medicare Other

## 2022-07-09 ENCOUNTER — Ambulatory Visit: Payer: Medicare Other

## 2022-07-24 ENCOUNTER — Encounter: Payer: Medicare Other | Admitting: Family Medicine

## 2022-07-29 ENCOUNTER — Ambulatory Visit
Admission: RE | Admit: 2022-07-29 | Discharge: 2022-07-29 | Disposition: A | Discharge status: Home / Self Care | Payer: Medicare Other | Source: Ambulatory Visit | Attending: Family Medicine | Admitting: Family Medicine

## 2022-07-29 DIAGNOSIS — Z1231 Encounter for screening mammogram for malignant neoplasm of breast: Secondary | ICD-10-CM

## 2022-10-15 ENCOUNTER — Telehealth: Payer: Self-pay | Admitting: *Deleted

## 2022-10-15 ENCOUNTER — Ambulatory Visit (INDEPENDENT_AMBULATORY_CARE_PROVIDER_SITE_OTHER): Payer: Medicare Other | Admitting: *Deleted

## 2022-10-15 DIAGNOSIS — E7800 Pure hypercholesterolemia, unspecified: Secondary | ICD-10-CM

## 2022-10-15 DIAGNOSIS — Z0000 Encounter for general adult medical examination without abnormal findings: Secondary | ICD-10-CM

## 2022-10-15 MED ORDER — DICYCLOMINE HCL 10 MG PO CAPS: 10.0000 mg | ORAL_CAPSULE | Freq: Every day | ORAL | 0 refills | Status: AC | PRN

## 2022-10-15 MED ORDER — SIMVASTATIN 20 MG PO TABS: 20.0000 mg | ORAL_TABLET | Freq: Every day | ORAL | 0 refills | Status: AC

## 2022-10-15 NOTE — Patient Instructions (Signed)
Ms. Regina Gonzalez , Thank you for taking time to come for your Medicare Wellness Visit. I appreciate your ongoing commitment to your health goals. Please review the following plan we discussed and let me know if I can assist you in the future.   Screening recommendations/referrals: Colonoscopy: no longer required Mammogram: up to date Bone Density: up to date Recommended yearly ophthalmology/optometry visit for glaucoma screening and checkup Recommended yearly dental visit for hygiene and checkup  Vaccinations: Influenza vaccine: Education provided Pneumococcal vaccine: up to date Tdap vaccine: up to date Shingles vaccine: up to date       Preventive Care 65 Years and Older, Female Preventive care refers to lifestyle choices and visits with your health care provider that can promote health and wellness. What does preventive care include? A yearly physical exam. This is also called an annual well check. Dental exams once or twice a year. Routine eye exams. Ask your health care provider how often you should have your eyes checked. Personal lifestyle choices, including: Daily care of your teeth and gums. Regular physical activity. Eating a healthy diet. Avoiding tobacco and drug use. Limiting alcohol use. Practicing safe sex. Taking low-dose aspirin every day. Taking vitamin and mineral supplements as recommended by your health care provider. What happens during an annual well check? The services and screenings done by your health care provider during your annual well check will depend on your age, overall health, lifestyle risk factors, and family history of disease. Counseling  Your health care provider may ask you questions about your: Alcohol use. Tobacco use. Drug use. Emotional well-being. Home and relationship well-being. Sexual activity. Eating habits. History of falls. Memory and ability to understand (cognition). Work and work Astronomer. Reproductive  health. Screening  You may have the following tests or measurements: Height, weight, and BMI. Blood pressure. Lipid and cholesterol levels. These may be checked every 5 years, or more frequently if you are over 57 years old. Skin check. Lung cancer screening. You may have this screening every year starting at age 66 if you have a 30-pack-year history of smoking and currently smoke or have quit within the past 15 years. Fecal occult blood test (FOBT) of the stool. You may have this test every year starting at age 9. Flexible sigmoidoscopy or colonoscopy. You may have a sigmoidoscopy every 5 years or a colonoscopy every 10 years starting at age 49. Hepatitis C blood test. Hepatitis B blood test. Sexually transmitted disease (STD) testing. Diabetes screening. This is done by checking your blood sugar (glucose) after you have not eaten for a while (fasting). You may have this done every 1-3 years. Bone density scan. This is done to screen for osteoporosis. You may have this done starting at age 46. Mammogram. This may be done every 1-2 years. Talk to your health care provider about how often you should have regular mammograms. Talk with your health care provider about your test results, treatment options, and if necessary, the need for more tests. Vaccines  Your health care provider may recommend certain vaccines, such as: Influenza vaccine. This is recommended every year. Tetanus, diphtheria, and acellular pertussis (Tdap, Td) vaccine. You may need a Td booster every 10 years. Zoster vaccine. You may need this after age 32. Pneumococcal 13-valent conjugate (PCV13) vaccine. One dose is recommended after age 83. Pneumococcal polysaccharide (PPSV23) vaccine. One dose is recommended after age 1. Talk to your health care provider about which screenings and vaccines you need and how often you need them. This  information is not intended to replace advice given to you by your health care provider.  Make sure you discuss any questions you have with your health care provider. Document Released: 02/16/2015 Document Revised: 10/10/2015 Document Reviewed: 11/21/2014 Elsevier Interactive Patient Education  2017 [employer].  Fall Prevention in the Home Falls can cause injuries. They can happen to people of all ages. There are many things you can do to make your home safe and to help prevent falls. What can I do on the outside of my home? Regularly fix the edges of walkways and driveways and fix any cracks. Remove anything that might make you trip as you walk through a door, such as a raised step or threshold. Trim any bushes or trees on the path to your home. Use bright outdoor lighting. Clear any walking paths of anything that might make someone trip, such as rocks or tools. Regularly check to see if handrails are loose or broken. Make sure that both sides of any steps have handrails. Any raised decks and porches should have guardrails on the edges. Have any leaves, snow, or ice cleared regularly. Use sand or salt on walking paths during winter. Clean up any spills in your garage right away. This includes oil or grease spills. What can I do in the bathroom? Use night lights. Install grab bars by the toilet and in the tub and shower. Do not use towel bars as grab bars. Use non-skid mats or decals in the tub or shower. If you need to sit down in the shower, use a plastic, non-slip stool. Keep the floor dry. Clean up any water that spills on the floor as soon as it happens. Remove soap buildup in the tub or shower regularly. Attach bath mats securely with double-sided non-slip rug tape. Do not have throw rugs and other things on the floor that can make you trip. What can I do in the bedroom? Use night lights. Make sure that you have a light by your bed that is easy to reach. Do not use any sheets or blankets that are too big for your bed. They should not hang down onto the floor. Have a  firm chair that has side arms. You can use this for support while you get dressed. Do not have throw rugs and other things on the floor that can make you trip. What can I do in the kitchen? Clean up any spills right away. Avoid walking on wet floors. Keep items that you use a lot in easy-to-reach places. If you need to reach something above you, use a strong step stool that has a grab bar. Keep electrical cords out of the way. Do not use floor polish or wax that makes floors slippery. If you must use wax, use non-skid floor wax. Do not have throw rugs and other things on the floor that can make you trip. What can I do with my stairs? Do not leave any items on the stairs. Make sure that there are handrails on both sides of the stairs and use them. Fix handrails that are broken or loose. Make sure that handrails are as long as the stairways. Check any carpeting to make sure that it is firmly attached to the stairs. Fix any carpet that is loose or worn. Avoid having throw rugs at the top or bottom of the stairs. If you do have throw rugs, attach them to the floor with carpet tape. Make sure that you have a light switch at the top  of the stairs and the bottom of the stairs. If you do not have them, ask someone to add them for you. What else can I do to help prevent falls? Wear shoes that: Do not have high heels. Have rubber bottoms. Are comfortable and fit you well. Are closed at the toe. Do not wear sandals. If you use a stepladder: Make sure that it is fully opened. Do not climb a closed stepladder. Make sure that both sides of the stepladder are locked into place. Ask someone to hold it for you, if possible. Clearly mark and make sure that you can see: Any grab bars or handrails. First and last steps. Where the edge of each step is. Use tools that help you move around (mobility aids) if they are needed. These include: Canes. Walkers. Scooters. Crutches. Turn on the lights when you  go into a dark area. Replace any light bulbs as soon as they burn out. Set up your furniture so you have a clear path. Avoid moving your furniture around. If any of your floors are uneven, fix them. If there are any pets around you, be aware of where they are. Review your medicines with your doctor. Some medicines can make you feel dizzy. This can increase your chance of falling. Ask your doctor what other things that you can do to help prevent falls. This information is not intended to replace advice given to you by your health care provider. Make sure you discuss any questions you have with your health care provider. Document Released: 11/16/2008 Document Revised: 06/28/2015 Document Reviewed: 02/24/2014 Elsevier Interactive Patient Education  2017 [employer].

## 2022-10-15 NOTE — Addendum Note (Signed)
Addended by: Sheliah Hatch on: 10/15/2022 12:24 PM   Modules accepted: Orders

## 2022-10-15 NOTE — Telephone Encounter (Signed)
Medications sent to requested [facility]

## 2022-10-15 NOTE — Progress Notes (Signed)
Subjective:   Regina Gonzalez is a 77 y.o. female who presents for Medicare Annual (Subsequent) preventive examination.  Visit Complete: Virtual  I connected with  Regina Gonzalez on 10/15/22 by a audio enabled telemedicine application and verified that I am speaking with the correct person using two identifiers.  Patient Location: Home  Provider Location: Home Office  I discussed the limitations of evaluation and management by telemedicine. The patient expressed understanding and agreed to proceed.  Vital Signs: Unable to obtain new vitals due to this being a telehealth visit.   Review of Systems     Cardiac Risk Factors include: advanced age (>79men, >74 women);hypertension     Objective:    There were no vitals filed for this visit. There is no height or weight on file to calculate BMI.     10/15/2022   11:17 AM 05/03/2022    7:32 PM 09/26/2021   10:04 AM 08/31/2020   11:13 PM 08/22/2020   11:21 AM 11/14/2019   12:53 PM 01/03/2018    8:27 AM  Advanced Directives  Does Patient Have a Medical Advance Directive? Yes No Yes Yes Yes Yes Yes  Type of Forensic scientist of [place];Living will Healthcare Power of [place];Living will Healthcare Power of [place];Living will Healthcare Power of [place];Living will Healthcare Power of [place];Living will  Does patient want to make changes to medical advance directive?    No - Patient declined No - Patient declined    Copy of Healthcare Power of Attorney in Chart? Yes - validated most recent copy scanned in chart (See row information)  No - copy requested  No - copy requested No - copy requested   Would patient like information on creating a medical advance directive?  No - Patient declined         Current Medications (verified) Outpatient Encounter Medications as of 10/15/2022  Medication Sig   Ascorbic Acid (VITAMIN C PO) Take by mouth daily.   calcium carbonate  (SUPER CALCIUM) 1500 (600 Ca) MG TABS tablet 2 tablets Orally Once a day   Cholecalciferol (VITAMIN D-3) 25 MCG (1000 UT) CAPS Take by mouth.   diclofenac Sodium (VOLTAREN) 1 % GEL Apply 2 g topically 4 (four) times daily.   dicyclomine (BENTYL) 10 MG capsule Take 1 capsule (10 mg total) by mouth daily as needed.   Docusate Calcium (STOOL SOFTENER PO) Take by mouth as needed.   DULoxetine (CYMBALTA) 60 MG capsule Take 1 capsule (60 mg total) by mouth daily.   linaclotide (LINZESS) 72 MCG capsule 1 capsule on an empty stomach Orally once-twice a week as needed   losartan (COZAAR) 50 MG tablet Take 1 tablet (50 mg total) by mouth daily.   Multiple Vitamins-Minerals (MULTIVITAMIN WITH MINERALS) tablet Take 1 tablet by mouth daily.    Probiotic Product (ALIGN PO) Take by mouth daily.   simethicone (GAS-X) 80 MG chewable tablet 4 (four) times daily as needed.   simvastatin (ZOCOR) 20 MG tablet Take 1 tablet (20 mg total) by mouth at bedtime.   triamcinolone ointment (KENALOG) 0.1 % Apply 1 Application topically 2 (two) times daily.   ALPRAZolam (XANAX) 1 MG tablet TAKE 1/2 TO 1 TABLET BY MOUTH TWO TIMES A DAY TO THREE TIMES A DAY AS NEEDED FOR ANXIETY (Patient not taking: Reported on 10/15/2022)   buPROPion (WELLBUTRIN XL) 150 MG 24 hr tablet Take 1 tablet (150 mg total) by mouth daily.   meloxicam (MOBIC) 15 MG  tablet Take 1 tablet (15 mg total) by mouth daily. (Patient not taking: Reported on 06/16/2022)   pantoprazole (PROTONIX) 40 MG tablet Take 1 tablet (40 mg total) by mouth 2 (two) times daily. (Patient not taking: Reported on 10/15/2022)   No facility-administered encounter medications on file as of 10/15/2022.    Allergies (verified) Codeine, Erythromycin, Metronidazole, Alendronate sodium, Doxycycline, and Macrolides and ketolides   History: Past Medical History:  Diagnosis Date   Allergic rhinitis    Anxiety    Arthritis    HANDS   Benign liver cyst    multiple   Chronic  constipation    Depression    Diverticulosis of colon    Emphysematous COPD (HCC)    External hemorrhoid    GERD (gastroesophageal reflux disease)    History of colon polyps    History of Helicobacter pylori infection    History of TIA (transient ischemic attack)    apr Nov 12, 2011-- no residual   Hyperlipidemia    Occipital neuralgia of left side 12/08/2014   Osteoporosis    Pancreatic cyst    simple benign   PONV (postoperative nausea and vomiting)    Prolapsed internal hemorrhoids, grade 3    Simple renal cyst    right    Wears glasses    Wears partial dentures    UPPER   Past Surgical History:  Procedure Laterality Date   APPENDECTOMY  11/12/1958   BREAST EXCISIONAL BIOPSY Left    BREAST EXCISIONAL BIOPSY Right    CATARACT EXTRACTION Left    COLONOSCOPY WITH PROPOFOL  last one 10-25-2013   polypectomy   D & C HYSTEROSCOPY W/ ENDOMETRIAL POLYPECTOMY     ESOPHAGOGASTRODUODENOSCOPY  last one 03-04-2013   EXCISION BILATERAL BREAST CYST  1995   HEMORRHOID SURGERY N/A 04/06/2014   Procedure: Hemmorhoidopexy with anal canal biopsy;  Surgeon: Romie Levee, MD;  Location: [facility] [place];  Service: General;  Laterality: N/A;   LAPAROSCOPIC RIGHT OVARIAN CYSTECTOMY/  D & C HYSTEROSCOPY ENDOMETRIAL POLYPECTOMY  11-Nov-2009   TONSILLECTOMY AND ADENOIDECTOMY  1955   TRANSTHORACIC ECHOCARDIOGRAM  08/13/2012   normal LV/  ef 60-65%/  mild AR/  trivial MR   Family History  Problem Relation Age of Onset   Cirrhosis Mother        non-alcoholic   Alcoholism Father    Multiple sclerosis Daughter    Pancreatitis Daughter    Diabetes type II Brother    Heart attack Brother    Colon cancer Neg Hx    Breast cancer Neg Hx    Social History   Socioeconomic History   Marital status: Widowed    Spouse name: Channing Mutters   Number of children: 2   Years of education: 12   Highest education level: Not on file  Occupational History   Occupation: retired  Tobacco Use   Smoking status: Former     Current packs/day: 0.00    Average packs/day: 1 pack/day for 17.0 years (17.0 ttl pk-yrs)    Types: Cigarettes    Start date: 04/03/1971    Quit date: 04/02/1988    Years since quitting: 34.5   Smokeless tobacco: Never  Vaping Use   Vaping status: Never Used  Substance and Sexual Activity   Alcohol use: No   Drug use: No   Sexual activity: Not Currently  Other Topics Concern   Not on file  Social History Narrative   Patient lives at home with her grandson.  Husband Channing Mutters) died November 11, 2017 as  did her daughter Sharyl Nimrod.  Works for [employer]. Patient has high school education and two children (1 deceased). Caffeine 2-3 cups daily.   Right handed    Social Determinants of Health   Financial Resource Strain: Low Risk  (10/15/2022)   Overall Financial Resource Strain (CARDIA)    Difficulty of Paying Living Expenses: Not hard at all  Food Insecurity: No Food Insecurity (10/15/2022)   Hunger Vital Sign    Worried About Running Out of Food in the Last Year: Never true    Ran Out of Food in the Last Year: Never true  Transportation Needs: No Transportation Needs (10/15/2022)   PRAPARE - Administrator, Civil Service (Medical): No    Lack of Transportation (Non-Medical): No  Physical Activity: Sufficiently Active (10/15/2022)   Exercise Vital Sign    Days of Exercise per Week: 4 days    Minutes of Exercise per Session: 40 min  Stress: No Stress Concern Present (10/15/2022)   [employer] of Occupational Health - Occupational Stress Questionnaire    Feeling of Stress : Only a little  Social Connections: Moderately Isolated (10/15/2022)   Social Connection and Isolation Panel [NHANES]    Frequency of Communication with Friends and Family: Three times a week    Frequency of Social Gatherings with Friends and Family: Once a week    Attends Religious Services: More than 4 times per year    Active Member of [employer] or Organizations: No    Attends Banker  Meetings: Never    Marital Status: Widowed    Tobacco Counseling Counseling given: Not Answered   Clinical Intake:  Pre-visit preparation completed: Yes  Pain : No/denies pain     Diabetes: No            Activities of Daily Living    10/15/2022   11:18 AM 06/16/2022   12:43 PM  In your present state of health, do you have any difficulty performing the following activities:  Hearing? 0 0  Vision? 0 0  Difficulty concentrating or making decisions? 0 0  Walking or climbing stairs? 0 0  Dressing or bathing? 0 0  Doing errands, shopping? 0 0  Preparing Food and eating ? N   Using the Toilet? N   In the past six months, have you accidently leaked urine? N   Do you have problems with loss of bowel control? N   Managing your Medications? N   Managing your Finances? N   Housekeeping or managing your Housekeeping? N     Patient Care Team: Sheliah Hatch, MD as PCP - General Marinus Maw, MD as PCP - Electrophysiology (Cardiology) Willis Modena, MD as Consulting Physician (Gastroenterology) Celso Amy, PA-C as Physician Assistant (Gastroenterology) [facility] (Optometry)  Indicate any recent Medical Services you may have received from other than [facility] providers in the past year (date may be approximate).     Assessment:   This is a routine wellness examination for [place].  Hearing/Vision screen Hearing Screening - Comments:: No trouble hearing Vision Screening - Comments:: Up to date Will be finding a new one is [place]   Goals Addressed             This Visit's Progress    Patient Stated       Would like to get finances straight       Depression Screen    10/15/2022   11:22 AM 06/16/2022  12:43 PM 05/09/2022   10:36 AM 01/23/2022    1:00 PM 12/17/2021   11:01 AM 12/11/2021   10:24 AM 09/26/2021   10:01 AM  PHQ 2/9 Scores  PHQ - 2 Score 4 1 3 2  2 2   PHQ- 9 Score 5 2  6  7 4   Exception Documentation     Other- indicate  reason in comment box    Not completed     pt was just here last week      Fall Risk    10/15/2022   11:16 AM 06/16/2022   12:43 PM 05/09/2022   10:36 AM 01/23/2022    1:00 PM 12/17/2021   11:02 AM  Fall Risk   Falls in the past year? 0 0 0 0 0  Number falls in past yr: 0 0 0  0  Injury with Fall? 0 0 0  0  Risk for fall due to :  No Fall Risks No Fall Risks No Fall Risks No Fall Risks  Follow up Falls evaluation completed;Education provided;Falls prevention discussed Falls evaluation completed Falls evaluation completed Falls evaluation completed Falls evaluation completed    MEDICARE RISK AT HOME: Medicare Risk at Home Any stairs in or around the home?: No If so, are there any without handrails?: No Home free of loose throw rugs in walkways, pet beds, electrical cords, etc?: Yes Adequate lighting in your home to reduce risk of falls?: Yes Life alert?: No Use of a cane, walker or w/c?: No Grab bars in the bathroom?: Yes Shower chair or bench in shower?: No Elevated toilet seat or a handicapped toilet?: No  TIMED UP AND GO:  Was the test performed?  No    Cognitive Function:        10/15/2022   11:19 AM 09/26/2021   10:05 AM 11/14/2019    1:03 PM  6CIT Screen  What Year? 0 points 0 points 0 points  What month? 0 points 0 points 0 points  What time? 0 points 0 points 0 points  Count back from 20 0 points 0 points 0 points  Months in reverse 0 points 0 points 0 points  Repeat phrase 0 points 0 points 0 points  Total Score 0 points 0 points 0 points    Immunizations Immunization History  Administered Date(s) Administered   Fluad Quad(high Dose 65+) 10/27/2018, 10/11/2019   Influenza Split 01/08/2011   Influenza Whole 11/03/2009   Influenza, High Dose Seasonal PF 11/23/2017   Influenza,inj,Quad PF,6+ Mos 11/01/2012, 11/25/2013, 12/03/2016   Influenza-Unspecified 10/25/2020, 11/11/2021   PFIZER(Purple Top)SARS-COV-2 Vaccination 03/20/2019, 04/12/2019, 11/15/2019    Pneumococcal Conjugate-13 09/23/2013   Pneumococcal Polysaccharide-23 09/25/2015   Td 02/04/2008   Tdap 12/24/2019   Zoster Recombinant(Shingrix) 11/28/2019, 05/11/2020    TDAP status: Up to date  Flu Vaccine status: Due, Education has been provided regarding the importance of this vaccine. Advised may receive this vaccine at local pharmacy or Health Dept. Aware to provide a copy of the vaccination record if obtained from local pharmacy or Health Dept. Verbalized acceptance and understanding.  Pneumococcal vaccine status: Up to date  Covid-19 vaccine status: Information provided on how to obtain vaccines.   Qualifies for Shingles Vaccine? No   Zostavax completed Yes   Shingrix Completed?: Yes  Screening Tests Health Maintenance  Topic Date Due   INFLUENZA VACCINE  05/04/2023 (Originally 09/04/2022)   DEXA SCAN  07/03/2023   MAMMOGRAM  07/29/2023   Medicare Annual Wellness (AWV)  10/15/2023   Colonoscopy  06/22/2029   DTaP/Tdap/Td (3 - Td or Tdap) 12/23/2029   Pneumonia Vaccine 55+ Years old  Completed   Hepatitis C Screening  Completed   Zoster Vaccines- Shingrix  Completed   HPV VACCINES  Aged Out   COVID-19 Vaccine  Discontinued    Health Maintenance  There are no preventive care reminders to display for this patient.   Colorectal cancer screening: No longer required.   Mammogram status: Completed  . Repeat every year  Bone Density status: Completed 2023. Results reflect: Bone density results: OSTEOPOROSIS. Repeat every 2 years.  Lung Cancer Screening: (Low Dose CT Chest recommended if Age 17-80 years, 20 pack-year currently smoking OR have quit w/in 15years.) does not qualify.   Lung Cancer Screening Referral:   Additional Screening:  Hepatitis C Screening: does not qualify; Completed 2017  Vision Screening: Recommended annual ophthalmology exams for early detection of glaucoma and other disorders of the eye. Is the patient up to date with their annual eye  exam?  no Who is the provider or what is the name of the office in which the patient attends annual eye exams? Will find a eye doctor in [facility] If pt is not established with a provider, would they like to be referred to a provider to establish care? No .   Dental Screening: Recommended annual dental exams for proper oral hygiene   Community Resource Referral / Chronic Care Management: CRR required this visit?  No   CCM required this visit?  No     Plan:     I have personally reviewed and noted the following in the patient's chart:   Medical and social history Use of alcohol, tobacco or illicit drugs  Current medications and supplements including opioid prescriptions. Patient is not currently taking opioid prescriptions. Functional ability and status Nutritional status Physical activity Advanced directives List of other physicians Hospitalizations, surgeries, and ER visits in previous 12 months Vitals Screenings to include cognitive, depression, and falls Referrals and appointments  In addition, I have reviewed and discussed with patient certain preventive protocols, quality metrics, and best practice recommendations. A written personalized care plan for preventive services as well as general preventive health recommendations were provided to patient.     Remi Haggard, LPN   09/12/9831   After Visit Summary: (MyChart) Due to this being a telephonic visit, the after visit summary with patients personalized plan was offered to patient via MyChart   Nurse Notes:

## 2022-10-15 NOTE — Telephone Encounter (Signed)
Pt has been notified.

## 2022-10-15 NOTE — Telephone Encounter (Signed)
Refill for Bentyl 10 mg Simvastatin 20 mg   Karin Golden on file.  Patients daughter would like to pick up at pharmacy by Saturday.   Dail has moved to [facility], is in the process of establishing care , but will need refills until she can be seen.  Patient would also like to let Dr. Beverely Low know that she gas finally stopped taking her Xanax .  Any questions can send a message in [facility]

## 2022-12-17 ENCOUNTER — Ambulatory Visit: Payer: Medicare Other | Admitting: Family Medicine

## 2023-08-17 NOTE — Progress Notes (Signed)
 This encounter was created in error - please disregard.
